# Patient Record
Sex: Female | Born: 1960 | Race: Black or African American | Hispanic: No | Marital: Single | Smoking: Former smoker
Health system: Southern US, Community
[De-identification: ages and names within clinical notes are randomized; demographics above are authoritative.]

## PROBLEM LIST (undated history)

## (undated) DIAGNOSIS — I700 Atherosclerosis of aorta: Secondary | ICD-10-CM

## (undated) DIAGNOSIS — M5412 Radiculopathy, cervical region: Secondary | ICD-10-CM

## (undated) DIAGNOSIS — I739 Peripheral vascular disease, unspecified: Secondary | ICD-10-CM

## (undated) DIAGNOSIS — G5600 Carpal tunnel syndrome, unspecified upper limb: Secondary | ICD-10-CM

## (undated) DIAGNOSIS — E785 Hyperlipidemia, unspecified: Secondary | ICD-10-CM

## (undated) DIAGNOSIS — J45909 Unspecified asthma, uncomplicated: Secondary | ICD-10-CM

## (undated) DIAGNOSIS — M5416 Radiculopathy, lumbar region: Secondary | ICD-10-CM

## (undated) DIAGNOSIS — I4891 Unspecified atrial fibrillation: Secondary | ICD-10-CM

## (undated) DIAGNOSIS — I2510 Atherosclerotic heart disease of native coronary artery without angina pectoris: Secondary | ICD-10-CM

## (undated) DIAGNOSIS — G5602 Carpal tunnel syndrome, left upper limb: Secondary | ICD-10-CM

## (undated) DIAGNOSIS — M069 Rheumatoid arthritis, unspecified: Secondary | ICD-10-CM

## (undated) DIAGNOSIS — I5032 Chronic diastolic (congestive) heart failure: Secondary | ICD-10-CM

## (undated) DIAGNOSIS — M170 Bilateral primary osteoarthritis of knee: Secondary | ICD-10-CM

## (undated) DIAGNOSIS — I340 Nonrheumatic mitral (valve) insufficiency: Secondary | ICD-10-CM

## (undated) DIAGNOSIS — I219 Acute myocardial infarction, unspecified: Secondary | ICD-10-CM

## (undated) DIAGNOSIS — I272 Pulmonary hypertension, unspecified: Secondary | ICD-10-CM

## (undated) DIAGNOSIS — M1990 Unspecified osteoarthritis, unspecified site: Secondary | ICD-10-CM

## (undated) DIAGNOSIS — I9789 Other postprocedural complications and disorders of the circulatory system, not elsewhere classified: Secondary | ICD-10-CM

## (undated) DIAGNOSIS — R7303 Prediabetes: Secondary | ICD-10-CM

## (undated) DIAGNOSIS — K219 Gastro-esophageal reflux disease without esophagitis: Secondary | ICD-10-CM

## (undated) DIAGNOSIS — E7800 Pure hypercholesterolemia, unspecified: Secondary | ICD-10-CM

## (undated) DIAGNOSIS — Z7901 Long term (current) use of anticoagulants: Secondary | ICD-10-CM

## (undated) DIAGNOSIS — J432 Centrilobular emphysema: Secondary | ICD-10-CM

## (undated) DIAGNOSIS — I440 Atrioventricular block, first degree: Secondary | ICD-10-CM

## (undated) DIAGNOSIS — I639 Cerebral infarction, unspecified: Secondary | ICD-10-CM

## (undated) DIAGNOSIS — I509 Heart failure, unspecified: Secondary | ICD-10-CM

## (undated) DIAGNOSIS — I6523 Occlusion and stenosis of bilateral carotid arteries: Secondary | ICD-10-CM

## (undated) HISTORY — PX: WISDOM TOOTH EXTRACTION: SHX21

## (undated) HISTORY — DX: Gastro-esophageal reflux disease without esophagitis: K21.9

## (undated) HISTORY — DX: Unspecified osteoarthritis, unspecified site: M19.90

## (undated) HISTORY — PX: CARDIAC VALVE REPLACEMENT: SHX585

---

## 2005-01-03 DIAGNOSIS — I219 Acute myocardial infarction, unspecified: Secondary | ICD-10-CM

## 2005-01-03 HISTORY — DX: Acute myocardial infarction, unspecified: I21.9

## 2006-04-17 ENCOUNTER — Ambulatory Visit: Payer: Self-pay | Admitting: Internal Medicine

## 2006-05-19 ENCOUNTER — Emergency Department: Payer: Self-pay | Admitting: Emergency Medicine

## 2006-05-20 ENCOUNTER — Emergency Department: Payer: Self-pay | Admitting: Emergency Medicine

## 2006-05-21 ENCOUNTER — Emergency Department: Payer: Self-pay | Admitting: Emergency Medicine

## 2006-05-24 ENCOUNTER — Emergency Department: Payer: Self-pay | Admitting: Emergency Medicine

## 2012-01-04 HISTORY — PX: BREAST BIOPSY: SHX20

## 2012-05-23 ENCOUNTER — Ambulatory Visit: Payer: Self-pay

## 2012-05-30 ENCOUNTER — Ambulatory Visit: Payer: Self-pay

## 2012-06-11 ENCOUNTER — Encounter: Payer: Self-pay | Admitting: General Surgery

## 2012-06-11 ENCOUNTER — Ambulatory Visit (INDEPENDENT_AMBULATORY_CARE_PROVIDER_SITE_OTHER): Payer: PRIVATE HEALTH INSURANCE | Admitting: General Surgery

## 2012-06-11 VITALS — BP 140/82 | HR 62 | Resp 16 | Ht 68.0 in | Wt 250.0 lb

## 2012-06-11 DIAGNOSIS — N63 Unspecified lump in unspecified breast: Secondary | ICD-10-CM

## 2012-06-11 NOTE — Progress Notes (Signed)
Patient ID: Theresa Phelps, female   DOB: 08/17/1960, 52 y.o.   MRN: [id number]  Chief Complaint  Patient presents with  . Breast Problem    abnormal mammogram category 4    HPI [name] is a 52 y.o. female who presents for consultation on an abnormal mammogram. The most recent mammogram was done on 05/30/12 cat 4 . No family history of breast cancer. Np prior breast problems. HPI  Past Medical History  Diagnosis Date  . Acid reflux   . Arthritis     Past Surgical History  Procedure Laterality Date  . Wisdom tooth extraction      History reviewed. No pertinent family history.  Social History History  Substance Use Topics  . Smoking status: Current Every Day Smoker -- 2.00 packs/day for 20 years    Types: Cigarettes  . Smokeless tobacco: Never Used  . Alcohol Use: Yes    Allergies  Allergen Reactions  . Shellfish Allergy     Current Outpatient Prescriptions  Medication Sig Dispense Refill  . ibuprofen (ADVIL,MOTRIN) 200 MG tablet Take 400 mg by mouth as needed for pain.       No current facility-administered medications for this visit.    Review of Systems Review of Systems  Constitutional: Negative.   Respiratory: Positive for chest tightness. Negative for apnea, cough, choking, shortness of breath, wheezing and stridor.   Cardiovascular: Negative.  Negative for chest pain, palpitations and leg swelling.    Blood pressure 140/82, pulse 62, resp. rate 16, height 5\' 8"  (1.727 m), weight 250 lb (113.399 kg).  Physical Exam Physical Exam  Constitutional: She appears well-developed and well-nourished.  Eyes: Conjunctivae are normal. No scleral icterus.  Neck: Neck supple.  Cardiovascular: Normal rate, regular rhythm and normal heart sounds.   Pulmonary/Chest: Breath sounds normal. Right breast exhibits no inverted nipple, no mass, no nipple discharge, no skin change and no tenderness. Left breast exhibits no inverted nipple, no mass, no nipple discharge, no  skin change and no tenderness.  Abdominal: Soft. Bowel sounds are normal. There is no hepatomegaly. There is tenderness in the left upper quadrant.  Lymphadenopathy:    She has no cervical adenopathy.    She has no axillary adenopathy.    Data Reviewed Mammogram reviewed. Hypoechoic mass noted left breast at 5 o'cl.  Assessment    Left breast mass. Core biopsy discussed and completed today with pt's consent.     Plan    Assuming path is benign will recheck in 2 mos.        SANKAR,SEEPLAPUTHUR G 06/12/2012, 8:44 AM

## 2012-06-11 NOTE — Patient Instructions (Addendum)

## 2012-06-12 ENCOUNTER — Encounter: Payer: Self-pay | Admitting: General Surgery

## 2012-06-12 DIAGNOSIS — N63 Unspecified lump in unspecified breast: Secondary | ICD-10-CM | POA: Insufficient documentation

## 2012-06-13 ENCOUNTER — Encounter: Payer: Self-pay | Admitting: General Surgery

## 2012-06-13 ENCOUNTER — Telehealth: Payer: Self-pay | Admitting: *Deleted

## 2012-06-13 LAB — PATHOLOGY

## 2012-06-13 NOTE — Telephone Encounter (Signed)
Notified patient as instructed, pathology benign, left breast biopsy, per Dr. Evette Cristal, patient pleased. Discussed follow-up appointments in 2 months, patient agrees

## 2012-08-14 ENCOUNTER — Encounter: Payer: Self-pay | Admitting: General Surgery

## 2012-08-14 ENCOUNTER — Other Ambulatory Visit: Payer: Self-pay

## 2012-08-14 ENCOUNTER — Ambulatory Visit (INDEPENDENT_AMBULATORY_CARE_PROVIDER_SITE_OTHER): Payer: PRIVATE HEALTH INSURANCE | Admitting: General Surgery

## 2012-08-14 VITALS — BP 124/80 | HR 78 | Resp 18 | Ht 68.0 in | Wt 251.0 lb

## 2012-08-14 DIAGNOSIS — N63 Unspecified lump in unspecified breast: Secondary | ICD-10-CM

## 2012-08-14 NOTE — Progress Notes (Signed)
Patient ID: Theresa Phelps, female   DOB: 1960/02/04, 52 y.o.   MRN: [id number]  Chief Complaint  Patient presents with  . Other    left breast ultrasound    HPI Theresa Phelps is a 52 y.o. female here today for an two month left breast follow up after core biopsy. The patient states in the last  2 two she has felt heaviness in the right breast as well as a stinging sensation. She denies any other problems at this time.   HPI  Past Medical History  Diagnosis Date  . Acid reflux   . Arthritis     Past Surgical History  Procedure Laterality Date  . Wisdom tooth extraction    . Breast biopsy Left 2014    History reviewed. No pertinent family history.  Social History History  Substance Use Topics  . Smoking status: Current Every Day Smoker -- 2.00 packs/day for 20 years    Types: Cigarettes  . Smokeless tobacco: Never Used  . Alcohol Use: Yes    Allergies  Allergen Reactions  . Shellfish Allergy     Current Outpatient Prescriptions  Medication Sig Dispense Refill  . ibuprofen (ADVIL,MOTRIN) 200 MG tablet Take 400 mg by mouth as needed for pain.       No current facility-administered medications for this visit.    Review of Systems Review of Systems  Constitutional: Negative.   Respiratory: Negative.   Cardiovascular: Negative.     Blood pressure 124/80, pulse 78, resp. rate 18, height 5\' 8"  (1.727 m), weight 251 lb (113.853 kg).  Physical Exam Physical Exam  Constitutional: She is oriented to person, place, and time. She appears well-developed and well-nourished.  Eyes: Conjunctivae are normal. No scleral icterus.  Neck: Trachea normal. No thyromegaly present.  Pulmonary/Chest: Right breast exhibits no inverted nipple, no mass, no nipple discharge, no skin change and no tenderness. Left breast exhibits no inverted nipple, no mass, no nipple discharge, no skin change and no tenderness.  Lymphadenopathy:    She has no cervical adenopathy.    She has no  axillary adenopathy.  Neurological: She is alert and oriented to person, place, and time.  Skin: Skin is warm and dry.    Data Reviewed Ultrasound done today.   Assessment    Stable small benign nodule left breast.     Plan    Patient to have left breast mammogram done 6 months from the original.      The patient has been asked to return to the office for a unilateral left breast diagnostic mammogram in November 2014. This has been arranged at the [facility] for 11-27-12 at 11 am.  Patient to follow up after mammogram in our office. Since mammogram is scheduled for late November she will be placed in December 2014 recalls for office follow up.   Dane Bloch G 08/14/2012, 7:15 PM

## 2012-08-14 NOTE — Patient Instructions (Addendum)
The patient has been asked to return to the office for a unilateral left breast diagnostic mammogram in November 2014. This has been arranged at the [facility] for 11-27-12 at 11 am.  Patient to follow up after mammogram in our office. Since mammogram is scheduled for late November she will be placed in December 2014 recalls for office follow up.

## 2012-08-15 ENCOUNTER — Encounter: Payer: Self-pay | Admitting: General Surgery

## 2012-11-27 ENCOUNTER — Ambulatory Visit: Payer: Self-pay | Admitting: General Surgery

## 2012-12-18 ENCOUNTER — Ambulatory Visit: Payer: PRIVATE HEALTH INSURANCE | Admitting: General Surgery

## 2013-01-07 ENCOUNTER — Ambulatory Visit (INDEPENDENT_AMBULATORY_CARE_PROVIDER_SITE_OTHER): Payer: PRIVATE HEALTH INSURANCE | Admitting: General Surgery

## 2013-01-07 ENCOUNTER — Encounter: Payer: Self-pay | Admitting: General Surgery

## 2013-01-07 VITALS — Ht 69.0 in | Wt 259.0 lb

## 2013-01-07 DIAGNOSIS — N63 Unspecified lump in unspecified breast: Secondary | ICD-10-CM

## 2013-01-07 NOTE — Patient Instructions (Signed)
Continue self breast exams. Call office for any new breast issues or concerns. 

## 2013-01-07 NOTE — Progress Notes (Signed)
Patient ID: Theresa Phelps, female   DOB: April 28, 1960, 53 y.o.   MRN: [id number]  Chief Complaint  Patient presents with  . Follow-up    mammogram    HPI Theresa Phelps is a 53 y.o. female who presents for a breast evaluation. The most recent mammogram was done on 11/27/12 at [facility].Patient does perform regular self breast checks and gets regular mammograms done. She had a core biopsy of the left breast on 06/12/12, pathology SCLEROSING ADENOSIS AND STROMAL FIBROSIS. Patient reports she is still having some stinging that comes and goes in the upper left breast. She does report some left arm numbness and swelling in the left hand for the last 3 days.   HPI  Past Medical History  Diagnosis Date  . Acid reflux   . Arthritis     Past Surgical History  Procedure Laterality Date  . Wisdom tooth extraction    . Breast biopsy Left 2014    History reviewed. No pertinent family history.  Social History History  Substance Use Topics  . Smoking status: Current Every Day Smoker -- 2.00 packs/day for 20 years    Types: Cigarettes  . Smokeless tobacco: Never Used  . Alcohol Use: Yes    Allergies  Allergen Reactions  . Shellfish Allergy     Current Outpatient Prescriptions  Medication Sig Dispense Refill  . ibuprofen (ADVIL,MOTRIN) 200 MG tablet Take 400 mg by mouth as needed for pain.       No current facility-administered medications for this visit.    Review of Systems Review of Systems  Constitutional: Negative.   Respiratory: Negative.   Cardiovascular: Negative.     Height 5\' 9"  (1.753 m), weight 259 lb (117.482 kg).  Physical Exam Physical Exam  Constitutional: She is oriented to person, place, and time. She appears well-developed and well-nourished.  Neck: Neck supple.  Pulmonary/Chest: Right breast exhibits no inverted nipple, no mass, no nipple discharge, no skin change and no tenderness. Left breast exhibits no inverted nipple, no mass, no nipple discharge, no skin  change and no tenderness.  Lymphadenopathy:    She has no cervical adenopathy.    She has no axillary adenopathy.  Neurological: She is alert and oriented to person, place, and time.    Data Reviewed Left mammogram shows the clip adjacent to a nodule which appears smaller than before.  Assessment    Stable exam with benign left breast nodule     Plan    Patient advised to obtain yearly mammogram and exams per PCP or [facility].        SANKAR,SEEPLAPUTHUR G 01/07/2013, 10:45 AM

## 2013-01-20 ENCOUNTER — Emergency Department: Payer: Self-pay | Admitting: Emergency Medicine

## 2013-01-20 LAB — CBC WITH DIFFERENTIAL/PLATELET
BASOS ABS: 0.1 10*3/uL (ref 0.0–0.1)
Basophil %: 1.1 %
Eosinophil #: 0.1 10*3/uL (ref 0.0–0.7)
Eosinophil %: 0.9 %
HCT: 45.1 % (ref 35.0–47.0)
HGB: 14.9 g/dL (ref 12.0–16.0)
LYMPHS ABS: 2.2 10*3/uL (ref 1.0–3.6)
LYMPHS PCT: 21 %
MCH: 29 pg (ref 26.0–34.0)
MCHC: 33.2 g/dL (ref 32.0–36.0)
MCV: 88 fL (ref 80–100)
Monocyte #: 0.2 x10 3/mm (ref 0.2–0.9)
Monocyte %: 1.9 %
NEUTROS ABS: 7.7 10*3/uL — AB (ref 1.4–6.5)
Neutrophil %: 75.1 %
PLATELETS: 248 10*3/uL (ref 150–440)
RBC: 5.15 10*6/uL (ref 3.80–5.20)
RDW: 13.5 % (ref 11.5–14.5)
WBC: 10.2 10*3/uL (ref 3.6–11.0)

## 2013-01-21 LAB — COMPREHENSIVE METABOLIC PANEL
ALBUMIN: 3.7 g/dL (ref 3.4–5.0)
ALK PHOS: 105 U/L
ALT: 22 U/L (ref 12–78)
AST: 14 U/L — AB (ref 15–37)
Anion Gap: 10 (ref 7–16)
BILIRUBIN TOTAL: 0.4 mg/dL (ref 0.2–1.0)
BUN: 17 mg/dL (ref 7–18)
CALCIUM: 9.2 mg/dL (ref 8.5–10.1)
CO2: 20 mmol/L — AB (ref 21–32)
Chloride: 108 mmol/L — ABNORMAL HIGH (ref 98–107)
Creatinine: 1.14 mg/dL (ref 0.60–1.30)
GFR CALC NON AF AMER: 55 — AB
Glucose: 165 mg/dL — ABNORMAL HIGH (ref 65–99)
Osmolality: 281 (ref 275–301)
Potassium: 4.1 mmol/L (ref 3.5–5.1)
Sodium: 138 mmol/L (ref 136–145)
TOTAL PROTEIN: 8.5 g/dL — AB (ref 6.4–8.2)

## 2013-01-21 LAB — URINALYSIS, COMPLETE
Bilirubin,UR: NEGATIVE
Glucose,UR: NEGATIVE mg/dL (ref 0–75)
Hyaline Cast: 8
Ketone: NEGATIVE
Nitrite: NEGATIVE
PH: 5 (ref 4.5–8.0)
Protein: NEGATIVE
RBC,UR: 10 /HPF (ref 0–5)
SPECIFIC GRAVITY: 1.027 (ref 1.003–1.030)
Squamous Epithelial: 7
WBC UR: 37 /HPF (ref 0–5)

## 2013-01-21 LAB — LIPASE, BLOOD: Lipase: 137 U/L (ref 73–393)

## 2013-01-21 LAB — TROPONIN I

## 2013-01-22 LAB — URINE CULTURE

## 2013-11-04 ENCOUNTER — Encounter: Payer: Self-pay | Admitting: General Surgery

## 2014-09-08 ENCOUNTER — Emergency Department: Payer: Medicaid Other

## 2014-09-08 ENCOUNTER — Encounter: Payer: Self-pay | Admitting: Emergency Medicine

## 2014-09-08 ENCOUNTER — Emergency Department
Admission: EM | Admit: 2014-09-08 | Discharge: 2014-09-09 | Disposition: A | Discharge status: Home / Self Care | Payer: Medicaid Other | Attending: Emergency Medicine | Admitting: Emergency Medicine

## 2014-09-08 DIAGNOSIS — Z720 Tobacco use: Secondary | ICD-10-CM | POA: Insufficient documentation

## 2014-09-08 DIAGNOSIS — G500 Trigeminal neuralgia: Secondary | ICD-10-CM | POA: Insufficient documentation

## 2014-09-08 DIAGNOSIS — R42 Dizziness and giddiness: Secondary | ICD-10-CM | POA: Diagnosis present

## 2014-09-08 DIAGNOSIS — Z79899 Other long term (current) drug therapy: Secondary | ICD-10-CM | POA: Diagnosis not present

## 2014-09-08 DIAGNOSIS — E119 Type 2 diabetes mellitus without complications: Secondary | ICD-10-CM | POA: Insufficient documentation

## 2014-09-08 LAB — URINALYSIS COMPLETE WITH MICROSCOPIC (ARMC ONLY)
BILIRUBIN URINE: NEGATIVE
Bacteria, UA: NONE SEEN
Glucose, UA: NEGATIVE mg/dL
KETONES UR: NEGATIVE mg/dL
Nitrite: NEGATIVE
PH: 5 (ref 5.0–8.0)
PROTEIN: NEGATIVE mg/dL
SPECIFIC GRAVITY, URINE: 1.016 (ref 1.005–1.030)

## 2014-09-08 LAB — CBC
HCT: 43.6 % (ref 35.0–47.0)
HEMOGLOBIN: 14.5 g/dL (ref 12.0–16.0)
MCH: 29.5 pg (ref 26.0–34.0)
MCHC: 33.3 g/dL (ref 32.0–36.0)
MCV: 88.7 fL (ref 80.0–100.0)
Platelets: 240 10*3/uL (ref 150–440)
RBC: 4.91 MIL/uL (ref 3.80–5.20)
RDW: 13.9 % (ref 11.5–14.5)
WBC: 8.4 10*3/uL (ref 3.6–11.0)

## 2014-09-08 LAB — BASIC METABOLIC PANEL
ANION GAP: 6 (ref 5–15)
BUN: 14 mg/dL (ref 6–20)
CALCIUM: 8.7 mg/dL — AB (ref 8.9–10.3)
CHLORIDE: 102 mmol/L (ref 101–111)
CO2: 25 mmol/L (ref 22–32)
Creatinine, Ser: 1.13 mg/dL — ABNORMAL HIGH (ref 0.44–1.00)
GFR calc non Af Amer: 54 mL/min — ABNORMAL LOW (ref >60)
Glucose, Bld: 99 mg/dL (ref 65–99)
Potassium: 4.1 mmol/L (ref 3.5–5.1)
SODIUM: 133 mmol/L — AB (ref 135–145)

## 2014-09-08 LAB — TROPONIN I: Troponin I: <0.03 ng/mL (ref <0.031)

## 2014-09-08 LAB — GLUCOSE, CAPILLARY: Glucose-Capillary: 89 mg/dL (ref 65–99)

## 2014-09-08 NOTE — ED Notes (Signed)
Patient ambulatory to triage with steady gait, without difficulty or distress noted; pt reports sharp pain to left side of head & ear last few days with numbness to toes and hands, dizziness; st off PO diabetic meds for several months

## 2014-09-08 NOTE — ED Provider Notes (Signed)
[facility] Emergency Department Provider Note  ____________________________________________  Time seen: 11:00 PM  I have reviewed the triage vital signs and the nursing notes.   HISTORY  Chief Complaint Dizziness; Headache; and Numbness      HPI Theresa Phelps is a 54 y.o. female presents with multiple medical complaints including left-sided facial pain with burning sensation on face bilateral hands and feet numbness and tingling dizziness. In addition patient admits to "running out of her diabetic medication and metformin times several months     Past Medical History  Diagnosis Date  . Acid reflux   . Arthritis   . Diabetes mellitus without complication     Patient Active Problem List   Diagnosis Date Noted  . Lump or mass in breast 06/12/2012    Past Surgical History  Procedure Laterality Date  . Wisdom tooth extraction    . Breast biopsy Left 2014    Current Outpatient Rx  Name  Route  Sig  Dispense  Refill  . cetirizine (ZYRTEC) 10 MG tablet   Oral   Take 10 mg by mouth daily.         Marland Kitchen ibuprofen (ADVIL,MOTRIN) 200 MG tablet   Oral   Take 400 mg by mouth as needed for pain.           Allergies Shellfish allergy  No family history on file.  Social History Social History  Substance Use Topics  . Smoking status: Current Every Day Smoker -- 0.50 packs/day for 20 years    Types: Cigarettes  . Smokeless tobacco: Never Used  . Alcohol Use: Yes     Comment: twice week    Review of Systems  Constitutional: Negative for fever. Eyes: Negative for visual changes. ENT: Negative for sore throat. Cardiovascular: Negative for chest pain. Respiratory: Negative for shortness of breath. Gastrointestinal: Negative for abdominal pain, vomiting and diarrhea. Genitourinary: Negative for dysuria. Musculoskeletal: Negative for back pain. Skin: Negative for rash. Neurological: Negative for headaches, focal weakness or  numbness.   10-point ROS otherwise negative.  ____________________________________________   PHYSICAL EXAM:  VITAL SIGNS: ED Triage Vitals  Enc Vitals Group     BP 09/08/14 1923 137/103 mmHg     Pulse Rate 09/08/14 1923 67     Resp --      Temp 09/08/14 1923 97.8 F (36.6 C)     Temp Source 09/08/14 1923 Oral     SpO2 09/08/14 1923 96 %     Weight 09/08/14 1923 242 lb (109.77 kg)     Height 09/08/14 1923 5\' 9"  (1.753 m)     Head Cir --      Peak Flow --      Pain Score 09/08/14 1926 8     Pain Loc --      Pain Edu? --      Excl. in [place]? --      Constitutional: Alert and oriented. Well appearing and in no distress. Eyes: Conjunctivae are normal. PERRL. Normal extraocular movements. ENT   Head: Normocephalic and atraumatic.   Nose: No congestion/rhinnorhea.   Mouth/Throat: Mucous membranes are moist.   Neck: No stridor. Hematological/Lymphatic/Immunilogical: No cervical lymphadenopathy. Cardiovascular: Normal rate, regular rhythm. Normal and symmetric distal pulses are present in all extremities. No murmurs, rubs, or gallops. Respiratory: Normal respiratory effort without tachypnea nor retractions. Breath sounds are clear and equal bilaterally. No wheezes/rales/rhonchi. Gastrointestinal: Soft and nontender. No distention. There is no CVA tenderness. Genitourinary: deferred Musculoskeletal: Nontender with normal range  of motion in all extremities. No joint effusions.  No lower extremity tenderness nor edema. Neurologic:  Normal speech and language. No gross focal neurologic deficits are appreciated. Speech is normal.  Skin:  Skin is warm, dry and intact. No rash noted. Psychiatric: Mood and affect are normal. Speech and behavior are normal. Patient exhibits appropriate insight and judgment.  ____________________________________________    LABS (pertinent positives/negatives)  Labs Reviewed  BASIC METABOLIC PANEL - Abnormal; Notable for the following:     Sodium 133 (*)    Creatinine, Ser 1.13 (*)    Calcium 8.7 (*)    GFR calc non Af Amer 54 (*)    All other components within normal limits  URINALYSIS COMPLETEWITH MICROSCOPIC ([facility] ONLY) - Abnormal; Notable for the following:    Color, Urine STRAW (*)    APPearance CLEAR (*)    Hgb urine dipstick 2+ (*)    Leukocytes, UA 1+ (*)    Squamous Epithelial / LPF 0-5 (*)    All other components within normal limits  GLUCOSE, CAPILLARY  CBC  TROPONIN I  CBG MONITORING, ED     ____________________________________________   EKG  ED ECG REPORT I, Rossi Silvestro, Sabula N, the attending physician, personally viewed and interpreted this ECG.   Date: 09/09/2014  EKG Time: 7:31 PM  Rate: 64  Rhythm: Normal sinus rhythm  Axis: None  Intervals: Normal  ST&T Change: None   ____________________________________________    RADIOLOGY     CT Head Wo Contrast (Final result) Result time: 09/09/14 00:39:59   Final result by Rad Results In Interface (09/09/14 00:39:59)   Narrative:   CLINICAL DATA: Initial valuation for acute left-sided headache. Blurry vision.  EXAM: CT HEAD WITHOUT CONTRAST  TECHNIQUE: Contiguous axial images were obtained from the base of the skull through the vertex without intravenous contrast.  COMPARISON: None.  FINDINGS: There is no acute intracranial hemorrhage or infarct. No mass lesion or midline shift. Gray-white matter differentiation is well maintained. Ventricles are normal in size without evidence of hydrocephalus. CSF containing spaces are within normal limits. No extra-axial fluid collection. Scattered vascular calcifications present within the carotid siphons. Mild chronic small vessel ischemic changes present.  The calvarium is intact.  Orbital soft tissues are within normal limits.  The paranasal sinuses and mastoid air cells are well pneumatized and free of fluid.  Scalp soft tissues are unremarkable.  IMPRESSION: 1. No acute  intracranial process. 2. Mild chronic small vessel ischemic disease.   Electronically Signed By: Jeannine Boga M.D. On: 09/09/2014 00:39       INITIAL IMPRESSION / ASSESSMENT AND PLAN / ED COURSE  Pertinent labs & imaging results that were available during my care of the patient were reviewed by me and considered in my medical decision making (see chart for details).  History of physical exam consistent with possible trigeminal neuralgia as such patient was prescribed carbamazepine  ____________________________________________   FINAL CLINICAL IMPRESSION(S) / ED DIAGNOSES  Final diagnoses:  Trigeminal neuralgia of left side of face      Gregor Hams, MD 09/09/14 [phone]

## 2014-09-09 MED ORDER — CARBAMAZEPINE 100 MG PO CHEW
100.0000 mg | CHEWABLE_TABLET | Freq: Once | ORAL | Status: AC
  Administered 2014-09-09: 100 mg via ORAL
  Filled 2014-09-09: qty 1

## 2014-09-09 MED ORDER — CARBAMAZEPINE 100 MG PO CHEW: 100.0000 mg | CHEWABLE_TABLET | Freq: Two times a day (BID) | ORAL | Status: DC

## 2014-09-09 NOTE — ED Notes (Signed)
Called pharmacy again to send pt medication.

## 2014-09-09 NOTE — ED Notes (Signed)
Called pharmacy to send pt medication. 

## 2014-09-09 NOTE — Discharge Instructions (Signed)
Trigeminal Neuralgia  Trigeminal neuralgia is a nerve disorder that causes sudden attacks of severe facial pain. It is caused by damage to the trigeminal nerve, a major nerve in the face. It is more common in women and in the elderly, although it can also happen in younger patients. Attacks last from a few seconds to several minutes and can occur from a couple of times per year to several times per day. Trigeminal neuralgia can be a very distressing and disabling condition. Surgery may be needed in very severe cases if medical treatment does not give relief.  HOME CARE INSTRUCTIONS    If your caregiver prescribed medication to help prevent attacks, take as directed.   To help prevent attacks:   Chew on the unaffected side of the mouth.   Avoid touching your face.   Avoid blasts of hot or cold air.   Men may wish to grow a beard to avoid having to shave.  SEEK IMMEDIATE MEDICAL CARE IF:   Pain is unbearable and your medicine does not help.   You develop new, unexplained symptoms (problems).   You have problems that may be related to a medication you are taking.  Document Released: 12/18/1999 Document Revised: 03/14/2011 Document Reviewed: 10/17/2008  Exit[employer] Patient Information 2015 [employer]. This information is not intended to replace advice given to you by your health care provider. Make sure you discuss any questions you have with your health care provider.

## 2014-09-26 ENCOUNTER — Ambulatory Visit
Admission: RE | Admit: 2014-09-26 | Discharge: 2014-09-26 | Disposition: A | Discharge status: Home / Self Care | Payer: Disability Insurance | Source: Ambulatory Visit | Attending: Internal Medicine | Admitting: Internal Medicine

## 2014-09-26 ENCOUNTER — Other Ambulatory Visit: Payer: Self-pay | Admitting: Family Medicine

## 2014-09-26 ENCOUNTER — Other Ambulatory Visit: Payer: Self-pay | Admitting: Internal Medicine

## 2014-09-26 DIAGNOSIS — M25552 Pain in left hip: Secondary | ICD-10-CM | POA: Diagnosis not present

## 2014-09-26 DIAGNOSIS — M1990 Unspecified osteoarthritis, unspecified site: Secondary | ICD-10-CM

## 2014-09-26 LAB — COMPREHENSIVE METABOLIC PANEL
ALT: 18 U/L (ref 14–54)
ANION GAP: 8 (ref 5–15)
AST: 21 U/L (ref 15–41)
Albumin: 3.6 g/dL (ref 3.5–5.0)
Alkaline Phosphatase: 96 U/L (ref 38–126)
BILIRUBIN TOTAL: 0.2 mg/dL — AB (ref 0.3–1.2)
BUN: 10 mg/dL (ref 6–20)
CO2: 23 mmol/L (ref 22–32)
Calcium: 8.8 mg/dL — ABNORMAL LOW (ref 8.9–10.3)
Chloride: 105 mmol/L (ref 101–111)
Creatinine, Ser: 0.85 mg/dL (ref 0.44–1.00)
Glucose, Bld: 109 mg/dL — ABNORMAL HIGH (ref 65–99)
POTASSIUM: 3.7 mmol/L (ref 3.5–5.1)
Sodium: 136 mmol/L (ref 135–145)
TOTAL PROTEIN: 7.5 g/dL (ref 6.5–8.1)

## 2015-03-25 ENCOUNTER — Encounter: Payer: Self-pay | Admitting: Podiatry

## 2015-03-25 ENCOUNTER — Ambulatory Visit (INDEPENDENT_AMBULATORY_CARE_PROVIDER_SITE_OTHER): Payer: Medicaid Other

## 2015-03-25 ENCOUNTER — Ambulatory Visit (INDEPENDENT_AMBULATORY_CARE_PROVIDER_SITE_OTHER): Payer: Medicaid Other | Admitting: Podiatry

## 2015-03-25 VITALS — BP 118/65 | HR 78 | Resp 16

## 2015-03-25 DIAGNOSIS — G9519 Other vascular myelopathies: Secondary | ICD-10-CM

## 2015-03-25 DIAGNOSIS — G629 Polyneuropathy, unspecified: Secondary | ICD-10-CM

## 2015-03-25 DIAGNOSIS — M2040 Other hammer toe(s) (acquired), unspecified foot: Secondary | ICD-10-CM | POA: Diagnosis not present

## 2015-03-25 DIAGNOSIS — I739 Peripheral vascular disease, unspecified: Secondary | ICD-10-CM | POA: Diagnosis not present

## 2015-03-25 DIAGNOSIS — M79673 Pain in unspecified foot: Secondary | ICD-10-CM

## 2015-03-25 DIAGNOSIS — M722 Plantar fascial fibromatosis: Secondary | ICD-10-CM | POA: Diagnosis not present

## 2015-03-25 NOTE — Progress Notes (Signed)
   Subjective:    Patient ID: Theresa Phelps, female    DOB: [birth year], 55 y.o.   MRN: [id number]  HPI: She presents today with her daughter with a chief complaint of pain and numbness to her toes while walking for the past 2-3 years. She states that not only does she have back problems because sciatica from her left thigh and buttocks but she also experiences bilateral calf pain on ambulation. She denies a history of diabetes does relate to a cigarette addiction for 40 years 3 packs a day. She relates that with ambulation she has calf pain that results in her having to stop and sit. She states that after she takes a break she can get up and travels same distance again.    Review of Systems  Constitutional: Positive for diaphoresis, activity change and appetite change.  HENT: Positive for ear pain and sinus pressure.   Respiratory: Positive for apnea and shortness of breath.   Cardiovascular: Positive for leg swelling.  Musculoskeletal: Positive for myalgias, back pain and arthralgias.  Allergic/Immunologic: Positive for food allergies.  Neurological: Positive for weakness.  All other systems reviewed and are negative.      Objective:   Physical Exam: Vital signs are stable alert and oriented 3 pulses are nonpalpable bilateral capillary fill time is immediate. Mild edema no cellulitis drainage or odor. Neurologic sensorium is hyper sensate with allodynic type pain to the toes bilaterally. She has no pain on palpation of the cast bilaterally. Deep tendon reflexes are intact bilaterally muscle strength +5 over 5 dorsiflexion plantar flexors and inverters everters all intrinsic musculature is intact. Orthopedic evaluation demonstrates rigid hammertoe deformities 2 through 5 bilaterally. She also has a firm nodule to the medial arch right foot that is nonpulsatile and is more than likely a plantar fibroma. This is exquisitely tender on palpation. She states that she does have some night pain with  her feet in the bed. Radiographs taken today do not demonstrate any major osseous abnormalities. Doesn't demonstrate any calcification of the soft tissue lesion.        Assessment & Plan:  Assessment: Intermittent claudication bilateral. 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  Plan: We discussed etiology pathology conservative versus surgical therapies. Before we can attempt any type of surgery or medication I would like to have a vascular evaluation and consult.

## 2015-03-26 ENCOUNTER — Telehealth: Payer: Self-pay | Admitting: Podiatry

## 2015-03-26 ENCOUNTER — Telehealth: Payer: Self-pay | Admitting: *Deleted

## 2015-03-26 DIAGNOSIS — R0989 Other specified symptoms and signs involving the circulatory and respiratory systems: Secondary | ICD-10-CM

## 2015-03-26 NOTE — Telephone Encounter (Signed)
-----   Message from [place] sent at 03/25/2015  3:56 PM EDT ----- Regarding: Vascular studies She needs arterial dopplers and vascular consult. She has NO palpable pulses. [facility] please! Thanks!!

## 2015-03-26 NOTE — Telephone Encounter (Signed)
Pt called in stating that Dr. Milinda Pointer was suppose to give her a Rx for pain but she never received the Rx

## 2015-03-26 NOTE — Telephone Encounter (Addendum)
Pt states she was to get a pain medication after yesterday's visit.  03/30/2015-Dr. Milinda Pointer states, "Note cleary states last line nothing will be done until after vascular evaluation including medication.  I want to wait until after vascular evaluation before we start any medication."  Unable to leave message voicemail not set up.

## 2015-03-26 NOTE — Telephone Encounter (Addendum)
Referral and dopplers faxed.  04/22/2015-Pt's dtr, April called the [facility] and stated pt had not received an appt call from [facility].  I spoke with Neoma Laming - [facility], she states they had left message for pt to call for an appt.  I called April and gave her the [phone] appt line.

## 2015-03-27 ENCOUNTER — Other Ambulatory Visit: Payer: Self-pay | Admitting: Podiatry

## 2015-03-27 NOTE — Telephone Encounter (Signed)
Note clearly states last line nothing will be done until after vascular evaluation including medication. I want to wait until after vascular evaluation before we start any medicine.

## 2015-04-22 ENCOUNTER — Telehealth: Payer: Self-pay | Admitting: Podiatry

## 2015-04-22 NOTE — Telephone Encounter (Signed)
Patients daughter here today said the patient saw Dr. Milinda Pointer back on 03/30/15 and he referred her to [facility] , she never heard from anyone about this appointment. Please call daughter April Caserta with details on this appointment.

## 2015-04-29 ENCOUNTER — Telehealth: Payer: Self-pay | Admitting: *Deleted

## 2015-04-29 DIAGNOSIS — R0989 Other specified symptoms and signs involving the circulatory and respiratory systems: Secondary | ICD-10-CM

## 2015-04-29 NOTE — Telephone Encounter (Addendum)
Theresa Phelps AUTHORIZATION# [id number], VALID 04/22/2015 EXPIRES 05/22/2015.  Faxed to [facility].

## 2015-05-18 ENCOUNTER — Other Ambulatory Visit: Payer: Self-pay | Admitting: Vascular Surgery

## 2015-05-28 ENCOUNTER — Encounter
Admission: RE | Disposition: A | Discharge status: Home / Self Care | Payer: Self-pay | Source: Ambulatory Visit | Attending: Vascular Surgery

## 2015-05-28 ENCOUNTER — Ambulatory Visit
Admission: RE | Admit: 2015-05-28 | Discharge: 2015-05-28 | Disposition: A | Discharge status: Home / Self Care | Payer: Medicaid Other | Source: Ambulatory Visit | Attending: Vascular Surgery | Admitting: Vascular Surgery

## 2015-05-28 DIAGNOSIS — M79662 Pain in left lower leg: Secondary | ICD-10-CM | POA: Diagnosis not present

## 2015-05-28 DIAGNOSIS — F17210 Nicotine dependence, cigarettes, uncomplicated: Secondary | ICD-10-CM | POA: Insufficient documentation

## 2015-05-28 DIAGNOSIS — M1990 Unspecified osteoarthritis, unspecified site: Secondary | ICD-10-CM | POA: Insufficient documentation

## 2015-05-28 DIAGNOSIS — M79661 Pain in right lower leg: Secondary | ICD-10-CM | POA: Diagnosis not present

## 2015-05-28 DIAGNOSIS — K219 Gastro-esophageal reflux disease without esophagitis: Secondary | ICD-10-CM | POA: Diagnosis not present

## 2015-05-28 DIAGNOSIS — Z91018 Allergy to other foods: Secondary | ICD-10-CM | POA: Insufficient documentation

## 2015-05-28 DIAGNOSIS — Z91013 Allergy to seafood: Secondary | ICD-10-CM | POA: Insufficient documentation

## 2015-05-28 DIAGNOSIS — I70213 Atherosclerosis of native arteries of extremities with intermittent claudication, bilateral legs: Secondary | ICD-10-CM | POA: Diagnosis present

## 2015-05-28 HISTORY — PX: LOWER EXTREMITY ANGIOGRAPHY: CATH118251

## 2015-05-28 LAB — CREATININE, SERUM
Creatinine, Ser: 1.03 mg/dL — ABNORMAL HIGH (ref 0.44–1.00)
GFR calc Af Amer: >60 mL/min (ref >60)
GFR, EST NON AFRICAN AMERICAN: 60 mL/min — AB (ref >60)

## 2015-05-28 LAB — BUN: BUN: 11 mg/dL (ref 6–20)

## 2015-05-28 SURGERY — LOWER EXTREMITY ANGIOGRAPHY
Anesthesia: Moderate Sedation | Laterality: Left

## 2015-05-28 MED ORDER — LORATADINE 10 MG PO TABS: 10.0000 mg | ORAL_TABLET | Freq: Every day | ORAL | Status: DC

## 2015-05-28 MED ORDER — PHENOL 1.4 % MT LIQD: 1.0000 | OROMUCOSAL | Status: DC | PRN

## 2015-05-28 MED ORDER — DEXTROSE 5 % IV SOLN
1.5000 g | INTRAVENOUS | Status: AC
  Administered 2015-05-28: 1.5 g via INTRAVENOUS

## 2015-05-28 MED ORDER — FENTANYL CITRATE (PF) 100 MCG/2ML IJ SOLN
INTRAMUSCULAR | Status: AC
  Filled 2015-05-28: qty 2

## 2015-05-28 MED ORDER — HEPARIN SODIUM (PORCINE) 1000 UNIT/ML IJ SOLN
INTRAMUSCULAR | Status: AC
  Filled 2015-05-28: qty 1

## 2015-05-28 MED ORDER — METHYLPREDNISOLONE SODIUM SUCC 125 MG IJ SOLR
125.0000 mg | INTRAMUSCULAR | Status: DC | PRN
  Administered 2015-05-28: 125 mg via INTRAVENOUS

## 2015-05-28 MED ORDER — CLOPIDOGREL BISULFATE 75 MG PO TABS: 75.0000 mg | ORAL_TABLET | Freq: Every day | ORAL | Status: DC

## 2015-05-28 MED ORDER — FAMOTIDINE 20 MG PO TABS
40.0000 mg | ORAL_TABLET | ORAL | Status: DC | PRN
  Administered 2015-05-28: 40 mg via ORAL

## 2015-05-28 MED ORDER — OXYCODONE-ACETAMINOPHEN 5-325 MG PO TABS: 1.0000 | ORAL_TABLET | ORAL | Status: DC | PRN

## 2015-05-28 MED ORDER — HYDROMORPHONE HCL 1 MG/ML IJ SOLN: 0.5000 mg | INTRAMUSCULAR | Status: DC | PRN

## 2015-05-28 MED ORDER — SIMVASTATIN 20 MG PO TABS: 20.0000 mg | ORAL_TABLET | Freq: Every day | ORAL | Status: DC

## 2015-05-28 MED ORDER — IOPAMIDOL (ISOVUE-300) INJECTION 61%
INTRAVENOUS | Status: DC | PRN
Start: 2015-05-28 — End: 2015-05-28
  Administered 2015-05-28: 70 mg via INTRA_ARTERIAL

## 2015-05-28 MED ORDER — METHYLPREDNISOLONE SODIUM SUCC 125 MG IJ SOLR
INTRAMUSCULAR | Status: AC
  Filled 2015-05-28: qty 2

## 2015-05-28 MED ORDER — MIDAZOLAM HCL 5 MG/5ML IJ SOLN
INTRAMUSCULAR | Status: AC
  Filled 2015-05-28: qty 5

## 2015-05-28 MED ORDER — ONDANSETRON HCL 4 MG/2ML IJ SOLN: 4.0000 mg | Freq: Four times a day (QID) | INTRAMUSCULAR | Status: DC | PRN

## 2015-05-28 MED ORDER — FAMOTIDINE 20 MG PO TABS
ORAL_TABLET | ORAL | Status: AC
  Filled 2015-05-28: qty 2

## 2015-05-28 MED ORDER — GUAIFENESIN-DM 100-10 MG/5ML PO SYRP: 15.0000 mL | ORAL_SOLUTION | ORAL | Status: DC | PRN

## 2015-05-28 MED ORDER — HEPARIN (PORCINE) IN NACL 2-0.9 UNIT/ML-% IJ SOLN
INTRAMUSCULAR | Status: AC
  Filled 2015-05-28: qty 1000

## 2015-05-28 MED ORDER — LIDOCAINE-EPINEPHRINE (PF) 1 %-1:200000 IJ SOLN
INTRAMUSCULAR | Status: AC
  Filled 2015-05-28: qty 30

## 2015-05-28 MED ORDER — SODIUM CHLORIDE 0.9 % IV SOLN: 500.0000 mL | Freq: Once | INTRAVENOUS | Status: DC | PRN

## 2015-05-28 MED ORDER — HYDROMORPHONE HCL 1 MG/ML IJ SOLN: 1.0000 mg | Freq: Once | INTRAMUSCULAR | Status: DC

## 2015-05-28 MED ORDER — SODIUM CHLORIDE 0.9 % IV SOLN
INTRAVENOUS | Status: DC
  Administered 2015-05-28: 10:00:00 via INTRAVENOUS

## 2015-05-28 MED ORDER — HYDRALAZINE HCL 20 MG/ML IJ SOLN: 5.0000 mg | INTRAMUSCULAR | Status: DC | PRN

## 2015-05-28 MED ORDER — LIDOCAINE-EPINEPHRINE (PF) 1 %-1:200000 IJ SOLN
INTRAMUSCULAR | Status: DC | PRN
  Administered 2015-05-28: 10 mL via INTRADERMAL

## 2015-05-28 MED ORDER — LABETALOL HCL 5 MG/ML IV SOLN: 10.0000 mg | INTRAVENOUS | Status: DC | PRN

## 2015-05-28 MED ORDER — FENTANYL CITRATE (PF) 100 MCG/2ML IJ SOLN
INTRAMUSCULAR | Status: DC | PRN
  Administered 2015-05-28 (×2): 50 ug via INTRAVENOUS

## 2015-05-28 MED ORDER — ACETAMINOPHEN 325 MG RE SUPP: 325.0000 mg | RECTAL | Status: DC | PRN

## 2015-05-28 MED ORDER — HYDROMORPHONE HCL 1 MG/ML IJ SOLN
INTRAMUSCULAR | Status: DC
Start: 2015-05-28 — End: 2015-05-28
  Filled 2015-05-28: qty 1

## 2015-05-28 MED ORDER — HEPARIN SODIUM (PORCINE) 1000 UNIT/ML IJ SOLN
INTRAMUSCULAR | Status: DC | PRN
  Administered 2015-05-28: 5000 [IU] via INTRAVENOUS

## 2015-05-28 MED ORDER — MIDAZOLAM HCL 2 MG/2ML IJ SOLN
INTRAMUSCULAR | Status: DC | PRN
  Administered 2015-05-28 (×2): 1 mg via INTRAVENOUS
  Administered 2015-05-28: 2 mg via INTRAVENOUS

## 2015-05-28 MED ORDER — METOPROLOL TARTRATE 5 MG/5ML IV SOLN: 2.0000 mg | INTRAVENOUS | Status: DC | PRN

## 2015-05-28 MED ORDER — ACETAMINOPHEN 325 MG PO TABS: 325.0000 mg | ORAL_TABLET | ORAL | Status: DC | PRN

## 2015-05-28 SURGICAL SUPPLY — 22 items
BALLN LUTONIX 6X150X130 (BALLOONS) ×6
BALLN ULTRVRSE 130X300X6 (BALLOONS) ×1
BALLN ULTRVRSE 6X300X130 (BALLOONS) ×2
BALLOON LUTONIX 6X150X130 (BALLOONS) ×2 IMPLANT
BALLOON ULTRVRSE 130X300X6 (BALLOONS) ×1 IMPLANT
CATH CXI 4F 90 DAV (MICROCATHETER) ×3 IMPLANT
CATH PIG 70CM (CATHETERS) ×3 IMPLANT
CATH VERT 100CM (CATHETERS) ×3 IMPLANT
DEVICE PRESTO INFLATION (MISCELLANEOUS) ×3 IMPLANT
DEVICE STARCLOSE SE CLOSURE (Vascular Products) ×3 IMPLANT
GLIDEWIRE ADV .035X260CM (WIRE) ×3 IMPLANT
LIFESTENT SOLO 6X200X135 (Permanent Stent) ×3 IMPLANT
LIFESTENT SOLO 7X200X135 (Permanent Stent) ×3 IMPLANT
PACK ANGIOGRAPHY (CUSTOM PROCEDURE TRAY) ×3 IMPLANT
SHEATH ANL2 6FRX45 HC (SHEATH) ×3 IMPLANT
SHEATH BRITE TIP 5FRX11 (SHEATH) ×3 IMPLANT
STENT VIABAHN 6X150X120 (Permanent Stent) ×3 IMPLANT
SYR MEDRAD MARK V 150ML (SYRINGE) ×3 IMPLANT
TOWEL OR 17X26 4PK STRL BLUE (TOWEL DISPOSABLE) ×3 IMPLANT
TUBING CONTRAST HIGH PRESS 72 (TUBING) ×3 IMPLANT
WIRE G V18X300CM (WIRE) ×3 IMPLANT
WIRE J 3MM .035X145CM (WIRE) ×3 IMPLANT

## 2015-05-28 NOTE — H&P (Signed)
  [facility] History & Physical Update  The patient was interviewed and re-examined.  The patient's previous History and Physical has been reviewed and is unchanged.  There is no change in the plan of care. We plan to proceed with the scheduled procedure.  Devonne Kitchen, MD  05/28/2015, 8:02 AM   

## 2015-05-28 NOTE — Op Note (Signed)
[facility] Percutaneous Study/Intervention Procedural Note   Date of Surgery: 05/28/2015  Surgeon(s):DEW,JASON   Assistants:none  Pre-operative Diagnosis: PAD with claudication bilateral lower extremities  Post-operative diagnosis: Same  Procedure(s) Performed: 1. Ultrasound guidance for vascular access right femoral artery 2. Catheter placement into left popliteal artery from right femoral approach 3. Aortogram and selective left lower extremity angiogram 4. Percutaneous transluminal angioplasty of left SFA and popliteal artery with 6 mm diameter angioplasty balloon including 2 Lutonix drug-coated angioplasty balloons proximally and distally 5. Self-expanding stent placement to the SFA and above-knee popliteal artery with 7 mm diameter by 20 cm length stent proximally and 6 mm diameter by 20 cm length stent distally  6.  Viabahn covered stent placement for the distal SFA and above-knee popliteal artery for extravasation after angioplasty and self-expanding stent placement using a 6 mm diameter by 15 cm length Viabahn stent 7. StarClose closure device right femoral artery  EBL: 25 cc  Contrast: 60 cc  Fluoro Time: 9 minutes  Moderate Conscious Sedation Time: approximately 50 minutes using 4 mg of Versed and 100 mcg of Fentanyl  Indications: Patient is a 55 y.o.female with short distance lifestyle limiting claudication of both lower extremities. The patient has noninvasive study showing markedly reduced ABIs bilaterally. The patient is brought in for angiography for further evaluation and potential treatment. Risks and benefits are discussed and informed consent is obtained  Procedure: The patient was identified and appropriate procedural time out was performed. The patient was then placed supine on the table and prepped and draped in the usual sterile  fashion.Moderate conscious sedation was administered during a face to face encounter with the patient throughout the procedure with my supervision of the RN administering medicines and monitoring the patient's vital signs, pulse oximetry, telemetry and mental status throughout from the start of the procedure until the patient was taken to the recovery room. Ultrasound was used to evaluate the right common femoral artery. It was patent . A digital ultrasound image was acquired. A Seldinger needle was used to access the right common femoral artery under direct ultrasound guidance and a permanent image was performed. A 0.035 J wire was advanced without resistance and a 5Fr sheath was placed. Pigtail catheter was placed into the aorta and an AP aortogram was performed. This demonstrated normal renal arteries and normal aorta and iliac segments without significant stenosis. I then crossed the aortic bifurcation and advanced to the left femoral head. Selective left lower extremity angiogram was then performed. This demonstrated near flush occlusion of the superficial femoral artery just beyond its origin with a patent profunda femoris artery and reconstitution of the popliteal artery above the knee. There was then good runoff distally with at least 2 vessels and possibly a third although opacification was reasonably slow initially. The patient was systemically heparinized and a 6 [place] Ansell sheath was then placed over the [employer] wire. I then used a Kumpe catheter and the advantage wire to navigate through the SFA occlusion and confirm intraluminal flow in the popliteal artery with a CXI catheter. I then replaced the wire and proceeded with treatment. 6 mm diameter angioplasty balloons were used to treat the SFA and popliteal artery. Proximally and distally, a 6 mm diameter by 15 cm length Lutonix drug-coated angioplasty balloon was used and in the midportion a second inflation with each balloon was used  as well. Each inflation was from 10-14 atm. Following angioplasty, there were still multiple areas with greater than 50% residual  stenosis and a marked dissection distally. I elected to place self-expanding stents and used a 7 mm diameter by 20 cm length proximally and a 6 mm diameter by 20 cm length distally with minimal overlap. About 1-2 cm of SFA were left proximally with only minimal stenosis above the stent before its origin. 6 mm balloon was used to post dilatation. 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 I elected to cover this area with a covered stent. I exchanged for a 0.018 wire and used a 6 mm diameter by 15 cm length Viabahn stent in the distal SFA and above-knee popliteal artery. Completion angiogram showed no extravasation and no greater than 20% residual stenosis throughout the stents and brisk flow distally. I elected to terminate the procedure. The sheath was removed and StarClose closure device was deployed in the right femoral artery with excellent hemostatic result. The patient was taken to the recovery room in stable condition having tolerated the procedure well.  Findings:  Aortogram: two left renal arteries (one low lying) and one renal artery, all patent with no stenosis.  Normal aorta and iliac segments without stenosis.   Left Lower Extremity: near flush occlusion of the superficial femoral artery just beyond its origin with a patent profunda femoris artery and reconstitution of the popliteal artery above the knee. There was then good runoff distally with at least 2 vessels and possibly a third although opacification was reasonably slow initially.   Disposition: Patient was taken to the recovery room in stable condition having tolerated the procedure well.  Complications: None  DEW,JASON 05/28/2015 2:10 PM

## 2015-05-28 NOTE — Discharge Instructions (Signed)
Angiogram, Care After °Refer to this sheet in the next few weeks. These instructions provide you with information about caring for yourself after your procedure. Your health care provider may also give you more specific instructions. Your treatment has been planned according to current medical practices, but problems sometimes occur. Call your health care provider if you have any problems or questions after your procedure. °WHAT TO EXPECT AFTER THE PROCEDURE °After your procedure, it is typical to have the following: °· Bruising at the catheter insertion site that usually fades within 1-2 weeks. °· Blood collecting in the tissue (hematoma) that may be painful to the touch. It should usually decrease in size and tenderness within 1-2 weeks. °HOME CARE INSTRUCTIONS °· Take medicines only as directed by your health care provider. °· You may shower 24-48 hours after the procedure or as directed by your health care provider. Remove the bandage (dressing) and gently wash the site with plain soap and water. Pat the area dry with a clean towel. Do not rub the site, because this may cause bleeding. °· Do not take baths, swim, or use a hot tub until your health care provider approves. °· Check your insertion site every day for redness, swelling, or drainage. °· Do not apply powder or lotion to the site. °· Do not lift over 10 lb (4.5 kg) for 5 days after your procedure or as directed by your health care provider. °· Ask your health care provider when it is okay to: °¨ Return to work or school. °¨ Resume usual physical activities or sports. °¨ Resume sexual activity. °· Do not drive home if you are discharged the same day as the procedure. Have someone else drive you. °· You may drive 24 hours after the procedure unless otherwise instructed by your health care provider. °· Do not operate machinery or power tools for 24 hours after the procedure or as directed by your health care provider. °· If your procedure was done as an  outpatient procedure, which means that you went home the same day as your procedure, a responsible adult should be with you for the first 24 hours after you arrive home. °· Keep all follow-up visits as directed by your health care provider. This is important. °SEEK MEDICAL CARE IF: °· You have a fever. °· You have chills. °· You have increased bleeding from the catheter insertion site. Hold pressure on the site. °SEEK IMMEDIATE MEDICAL CARE IF: °· You have unusual pain at the catheter insertion site. °· You have redness, warmth, or swelling at the catheter insertion site. °· You have drainage (other than a small amount of blood on the dressing) from the catheter insertion site. °· The catheter insertion site is bleeding, and the bleeding does not stop after 30 minutes of holding steady pressure on the site. °· The area near or just beyond the catheter insertion site becomes pale, cool, tingly, or numb. °  °This information is not intended to replace advice given to you by your health care provider. Make sure you discuss any questions you have with your health care provider. °  °Document Released: 07/08/2004 Document Revised: 01/10/2014 Document Reviewed: 05/23/2012 °Elsevier Interactive Patient Education ©2016 Elsevier [employer]. ° °

## 2015-05-29 ENCOUNTER — Encounter: Payer: Self-pay | Admitting: Vascular Surgery

## 2015-06-02 NOTE — H&P (Signed)
[facility] Admission History & Physical  MRN : [id number]  Theresa Phelps is a 55 y.o. (May 15, 1960) female who presents with chief complaint of No chief complaint on file. Marland Kitchen  History of Present Illness: Patient is a 55 year old female with pain in her extremities bilaterally with walking. She's had noninvasive studies which demonstrated significant peripheral arterial disease bilaterally with significant reduction in her ABIs bilaterally. Her symptoms are consistent with short distance claudication. She does not describe any ulceration or infection. She desires intervention for her lifestyle limiting claudication.  No current facility-administered medications for this encounter.   Current Outpatient Prescriptions  Medication Sig Dispense Refill  . cetirizine (ZYRTEC) 10 MG tablet Take 10 mg by mouth daily. Reported on 05/28/2015    . clopidogrel (PLAVIX) 75 MG tablet Take 1 tablet (75 mg total) by mouth daily. 30 tablet 11  . ibuprofen (ADVIL,MOTRIN) 200 MG tablet Take 400 mg by mouth as needed for pain. Reported on 05/28/2015    . simvastatin (ZOCOR) 20 MG tablet Take 1 tablet (20 mg total) by mouth daily at 6 PM. 30 tablet 5    Past Medical History  Diagnosis Date  . Acid reflux   . Arthritis     Past Surgical History  Procedure Laterality Date  . Wisdom tooth extraction    . Breast biopsy Left 2014  . Peripheral vascular catheterization Left 05/28/2015    Procedure: Lower Extremity Angiography;  Surgeon: Algernon Huxley, MD;  Location: [facility];  Service: Cardiovascular;  Laterality: Left;    Social History Social History  Substance Use Topics  . Smoking status: Current Every Day Smoker -- 0.50 packs/day for 20 years    Types: Cigarettes  . Smokeless tobacco: Never Used  . Alcohol Use: Yes     Comment: twice week    Family History No history of bleeding disorders, clotting disorders, or aneurysms  Allergies  Allergen Reactions  .  Shellfish Allergy Anaphylaxis  . Banana     Other reaction(s): Other (See Comments) Burning in the mouth  . Garlic     Other reaction(s): Other (See Comments) Pt states arms freeze up and can't talk but no SOB  . Fish-Derived Products Rash     REVIEW OF SYSTEMS (Negative unless checked)  Constitutional: [] Weight loss  [] Fever  [] Chills Cardiac: [] Chest pain   [] Chest pressure   [] Palpitations   [] Shortness of breath when laying flat   [] Shortness of breath at rest   [] Shortness of breath with exertion. Vascular:  [x] Pain in legs with walking   [] Pain in legs at rest   [] Pain in legs when laying flat   [] Claudication   [] Pain in feet when walking  [] Pain in feet at rest  [] Pain in feet when laying flat   [] History of DVT   [] Phlebitis   [] Swelling in legs   [] Varicose veins   [] Non-healing ulcers Pulmonary:   [] Uses home oxygen   [] Productive cough   [] Hemoptysis   [] Wheeze  [] COPD   [] Asthma Neurologic:  [] Dizziness  [] Blackouts   [] Seizures   [] History of stroke   [] History of TIA  [] Aphasia   [] Temporary blindness   [] Dysphagia   [] Weakness or numbness in arms   [] Weakness or numbness in legs Musculoskeletal:  [] Arthritis   [] Joint swelling   [] Joint pain   [] Low back pain Hematologic:  [] Easy bruising  [] Easy bleeding   [] Hypercoagulable state   [] Anemic  [] Hepatitis Gastrointestinal:  [] Blood in stool   []   Vomiting blood  [x] Gastroesophageal reflux/heartburn   [] Difficulty swallowing. Genitourinary:  [] Chronic kidney disease   [] Difficult urination  [] Frequent urination  [] Burning with urination   [] Blood in urine Skin:  [] Rashes   [] Ulcers   [] Wounds Psychological:  [] History of anxiety   []  History of major depression.  Physical Examination  Filed Vitals:   05/28/15 1345 05/28/15 1400 05/28/15 1430 05/28/15 1500  BP: 135/86 132/83 127/84 130/78  Pulse: 69 65 72   Temp:      Resp: 18 16 18 17   Weight:      SpO2: 90% 90% 92%    Body mass index is 31.29 kg/(m^2). Gen: WD/WN,  NAD Head: [place]/AT, No temporalis wasting. Prominent temp pulse not noted. Ear/Nose/Throat: Hearing grossly intact, nares w/o erythema or drainage, oropharynx w/o Erythema/Exudate,  Eyes: PERRLA, EOMI.  Neck: Supple, no nuchal rigidity.  No bruit or JVD.  Pulmonary:  Good air movement, clear to auscultation bilaterally, no use of accessory muscles.  Cardiac: RRR, normal S1, S2, no Murmurs, rubs or gallops. Vascular:  Vessel Right Left  Radial Palpable Palpable  Ulnar Palpable Palpable  Brachial Palpable Palpable  Carotid Palpable, without bruit Palpable, without bruit  Aorta Not palpable N/A  Femoral Palpable Palpable  Popliteal Not Palpable Not Palpable  PT  Palpable Not Palpable  DP Not Palpable Weakly Palpable   Gastrointestinal: soft, non-tender/non-distended. No guarding/reflex.  Musculoskeletal: M/S 5/5 throughout.  Extremities without ischemic changes.  No deformity or atrophy.  Neurologic: CN 2-12 intact. Pain and light touch intact in extremities.  Symmetrical.  Speech is fluent. Motor exam as listed above. Psychiatric: Judgment intact, Mood & affect appropriate for pt's clinical situation. Dermatologic: No rashes or ulcers noted.  No cellulitis or open wounds. Lymph : No Cervical, Axillary, or Inguinal lymphadenopathy.      CBC Lab Results  Component Value Date   WBC 8.4 09/08/2014   HGB 14.5 09/08/2014   HCT 43.6 09/08/2014   MCV 88.7 09/08/2014   PLT 240 09/08/2014    BMET    Component Value Date/Time   NA 136 09/26/2014 1745   NA 138 01/20/2013 2320   K 3.7 09/26/2014 1745   K 4.1 01/20/2013 2320   CL 105 09/26/2014 1745   CL 108* 01/20/2013 2320   CO2 23 09/26/2014 1745   CO2 20* 01/20/2013 2320   GLUCOSE 109* 09/26/2014 1745   GLUCOSE 165* 01/20/2013 2320   BUN 11 05/28/2015 1027   BUN 17 01/20/2013 2320   CREATININE 1.03* 05/28/2015 1027   CREATININE 1.14 01/20/2013 2320   CALCIUM 8.8* 09/26/2014 1745   CALCIUM 9.2 01/20/2013 2320   GFRNONAA 60*  05/28/2015 1027   GFRNONAA 55* 01/20/2013 2320   GFRAA >60 05/28/2015 1027   GFRAA >60 01/20/2013 2320   CrCl cannot be calculated (Unknown ideal weight.).  COAG No results found for: INR, PROTIME  Radiology No results found.    Assessment/Plan 1. PAD with claudication BLE.  For LLE angiogram today.  May treat the right leg in future depending on results from left leg intervention.  Risks and benefits discussed.   DEW,JASON, MD  06/02/2015 4:18 PM

## 2015-06-24 ENCOUNTER — Other Ambulatory Visit: Payer: Self-pay | Admitting: Vascular Surgery

## 2015-07-15 ENCOUNTER — Other Ambulatory Visit
Admission: RE | Admit: 2015-07-15 | Discharge: 2015-07-15 | Disposition: A | Discharge status: Home / Self Care | Payer: Medicaid Other | Source: Ambulatory Visit | Attending: Vascular Surgery | Admitting: Vascular Surgery

## 2015-07-15 DIAGNOSIS — I739 Peripheral vascular disease, unspecified: Secondary | ICD-10-CM | POA: Diagnosis not present

## 2015-07-15 LAB — BUN: BUN: 6 mg/dL (ref 6–20)

## 2015-07-15 LAB — CREATININE, SERUM
Creatinine, Ser: 0.88 mg/dL (ref 0.44–1.00)
GFR calc Af Amer: >60 mL/min (ref >60)

## 2015-07-16 ENCOUNTER — Ambulatory Visit
Admission: RE | Admit: 2015-07-16 | Discharge: 2015-07-16 | Disposition: A | Discharge status: Home / Self Care | Payer: Medicaid Other | Source: Ambulatory Visit | Attending: Vascular Surgery | Admitting: Vascular Surgery

## 2015-07-16 ENCOUNTER — Encounter
Admission: RE | Disposition: A | Discharge status: Home / Self Care | Payer: Self-pay | Source: Ambulatory Visit | Attending: Vascular Surgery

## 2015-07-16 DIAGNOSIS — Z8249 Family history of ischemic heart disease and other diseases of the circulatory system: Secondary | ICD-10-CM | POA: Insufficient documentation

## 2015-07-16 DIAGNOSIS — M7989 Other specified soft tissue disorders: Secondary | ICD-10-CM | POA: Diagnosis not present

## 2015-07-16 DIAGNOSIS — Z823 Family history of stroke: Secondary | ICD-10-CM | POA: Insufficient documentation

## 2015-07-16 DIAGNOSIS — M79609 Pain in unspecified limb: Secondary | ICD-10-CM | POA: Diagnosis not present

## 2015-07-16 DIAGNOSIS — I70213 Atherosclerosis of native arteries of extremities with intermittent claudication, bilateral legs: Secondary | ICD-10-CM | POA: Insufficient documentation

## 2015-07-16 DIAGNOSIS — I739 Peripheral vascular disease, unspecified: Secondary | ICD-10-CM | POA: Insufficient documentation

## 2015-07-16 DIAGNOSIS — R200 Anesthesia of skin: Secondary | ICD-10-CM | POA: Insufficient documentation

## 2015-07-16 DIAGNOSIS — Z8342 Family history of familial hypercholesterolemia: Secondary | ICD-10-CM | POA: Insufficient documentation

## 2015-07-16 DIAGNOSIS — F17200 Nicotine dependence, unspecified, uncomplicated: Secondary | ICD-10-CM | POA: Insufficient documentation

## 2015-07-16 HISTORY — DX: Peripheral vascular disease, unspecified: I73.9

## 2015-07-16 HISTORY — PX: PERIPHERAL VASCULAR CATHETERIZATION: SHX172C

## 2015-07-16 SURGERY — LOWER EXTREMITY ANGIOGRAPHY
Anesthesia: Moderate Sedation | Site: Leg Lower | Laterality: Right

## 2015-07-16 MED ORDER — MIDAZOLAM HCL 2 MG/2ML IJ SOLN
INTRAMUSCULAR | Status: DC | PRN
  Administered 2015-07-16 (×2): 1 mg via INTRAVENOUS
  Administered 2015-07-16: 2 mg via INTRAVENOUS

## 2015-07-16 MED ORDER — METHYLPREDNISOLONE SODIUM SUCC 125 MG IJ SOLR
125.0000 mg | INTRAMUSCULAR | Status: DC | PRN
  Administered 2015-07-16: 125 mg via INTRAVENOUS

## 2015-07-16 MED ORDER — FENTANYL CITRATE (PF) 100 MCG/2ML IJ SOLN
INTRAMUSCULAR | Status: AC
  Filled 2015-07-16: qty 2

## 2015-07-16 MED ORDER — SODIUM CHLORIDE 0.9 % IV SOLN
INTRAVENOUS | Status: DC
  Administered 2015-07-16 (×2): via INTRAVENOUS

## 2015-07-16 MED ORDER — HEPARIN (PORCINE) IN NACL 2-0.9 UNIT/ML-% IJ SOLN
INTRAMUSCULAR | Status: AC
  Filled 2015-07-16: qty 1000

## 2015-07-16 MED ORDER — IOPAMIDOL (ISOVUE-300) INJECTION 61%
INTRAVENOUS | Status: DC | PRN
  Administered 2015-07-16: 75 mL via INTRAVENOUS

## 2015-07-16 MED ORDER — FAMOTIDINE 20 MG PO TABS
ORAL_TABLET | ORAL | Status: AC
  Filled 2015-07-16: qty 1

## 2015-07-16 MED ORDER — DEXTROSE 5 % IV SOLN: 1.5000 g | INTRAVENOUS | Status: DC

## 2015-07-16 MED ORDER — CLOPIDOGREL BISULFATE 75 MG PO TABS
ORAL_TABLET | ORAL | Status: AC
  Administered 2015-07-16: 150 mg
  Filled 2015-07-16: qty 2

## 2015-07-16 MED ORDER — FENTANYL CITRATE (PF) 100 MCG/2ML IJ SOLN
INTRAMUSCULAR | Status: DC | PRN
  Administered 2015-07-16 (×3): 50 ug via INTRAVENOUS

## 2015-07-16 MED ORDER — HYDROMORPHONE HCL 1 MG/ML IJ SOLN: 1.0000 mg | Freq: Once | INTRAMUSCULAR | Status: DC

## 2015-07-16 MED ORDER — FAMOTIDINE 20 MG PO TABS
40.0000 mg | ORAL_TABLET | ORAL | Status: DC | PRN
  Administered 2015-07-16: 40 mg via ORAL

## 2015-07-16 MED ORDER — CLOPIDOGREL BISULFATE 75 MG PO TABS: 150.0000 mg | ORAL_TABLET | Freq: Once | ORAL | Status: DC

## 2015-07-16 MED ORDER — LIDOCAINE-EPINEPHRINE (PF) 1 %-1:200000 IJ SOLN
INTRAMUSCULAR | Status: AC
  Filled 2015-07-16: qty 30

## 2015-07-16 MED ORDER — ONDANSETRON HCL 4 MG/2ML IJ SOLN: 4.0000 mg | Freq: Four times a day (QID) | INTRAMUSCULAR | Status: DC | PRN

## 2015-07-16 MED ORDER — MIDAZOLAM HCL 2 MG/2ML IJ SOLN
INTRAMUSCULAR | Status: AC
  Filled 2015-07-16: qty 2

## 2015-07-16 MED ORDER — METHYLPREDNISOLONE SODIUM SUCC 125 MG IJ SOLR
INTRAMUSCULAR | Status: AC
  Filled 2015-07-16: qty 2

## 2015-07-16 SURGICAL SUPPLY — 17 items
BALLN LUTONIX 5X150X130 (BALLOONS) ×8
BALLN ULTRVRSE 6X100X130C (BALLOONS) ×4
BALLOON LUTONIX 5X150X130 (BALLOONS) ×4 IMPLANT
BALLOON ULTRVRSE 6X100X130C (BALLOONS) ×2 IMPLANT
CATH PIG 70CM (CATHETERS) ×4 IMPLANT
CATH VERT 100CM (CATHETERS) ×4 IMPLANT
DEVICE PRESTO INFLATION (MISCELLANEOUS) ×4 IMPLANT
DEVICE STARCLOSE SE CLOSURE (Vascular Products) ×4 IMPLANT
GLIDEWIRE ADV .035X260CM (WIRE) ×4 IMPLANT
LIFESTENT 6X80X130 (Permanent Stent) ×4 IMPLANT
PACK ANGIOGRAPHY (CUSTOM PROCEDURE TRAY) ×4 IMPLANT
SHEATH ANL2 6FRX45 HC (SHEATH) ×4 IMPLANT
SHEATH BRITE TIP 5FRX11 (SHEATH) ×4 IMPLANT
STENT LIFESTENT 6X170X130 (Permanent Stent) ×4 IMPLANT
SYR MEDRAD MARK V 150ML (SYRINGE) ×4 IMPLANT
TUBING CONTRAST HIGH PRESS 72 (TUBING) ×4 IMPLANT
WIRE J 3MM .035X145CM (WIRE) ×4 IMPLANT

## 2015-07-16 NOTE — Op Note (Addendum)
[facility] Percutaneous Study/Intervention Procedural Note   Date of Surgery: 07/16/2015  Surgeon(s):DEW,JASON   Assistants:none  Pre-operative Diagnosis: PAD with claudication BLE  Post-operative diagnosis: Same  Procedure(s) Performed: 1. Ultrasound guidance for vascular access left femoral artery 2. Catheter placement into right popliteal artery from left femoral approach 3. Aortogram and selective right lower extremity angiogram 4. Percutaneous transluminal angioplasty of right popliteal artery with 46m x 15 cm length Lutonix drug coated angioplasty balloon 5. percutaneous transluminal angioplasty of the right SFA with 5 mm x 15 cm length Lutonix drug coated balloon (inflated two additional times for mid and distal segments), and 6 mm diameter conventional angioplasty balloon proximally  6.  Stent placement to the proximal SFA with 6 mm x 10 cm stent and to the distal SFA and popliteal artery with 6 mm x 17 cm stent 7. StarClose closure device left femoral artery  EBL: 25 cc  Contrast: 75 cc  Fluoro Time: 7.1 minutes  Moderate Conscious Sedation Time: approximately 40 minutes using 5 mg of Versed and 200 mcg of Fentanyl  Indications: Patient is a 55y.o.female with short distance claudication.  THe left leg has been treated and is better. The patient has noninvasive study showing right ABI of 0.6. The patient is brought in for angiography for further evaluation and potential treatment. Risks and benefits are discussed and informed consent is obtained  Procedure: The patient was identified and appropriate procedural time out was performed. The patient was then placed supine on the table and prepped and draped in the usual sterile fashion.Moderate conscious sedation was administered during a face to face encounter with the patient throughout the procedure with  my supervision of the RN administering medicines and monitoring the patient's vital signs, pulse oximetry, telemetry and mental status throughout from the start of the procedure until the patient was taken to the recovery room. Ultrasound was used to evaluate the left common femoral artery. It was patent . A digital ultrasound image was acquired. A Seldinger needle was used to access the left common femoral artery under direct ultrasound guidance and a permanent image was performed. A 0.035 J wire was advanced without resistance and a 5Fr sheath was placed. Pigtail catheter was placed into the aorta and an AP aortogram was performed. This demonstrated normal renal arteries and normal aorta and iliac segments without significant stenosis. I then crossed the aortic bifurcation and advanced to the right femoral head. Selective right lower extremity angiogram was then performed. This demonstrated near flush right SFA occlusion with reconstitution of the popliteal artery just above the knee, two vessel runoff distally. The patient was systemically heparinized and a 6 [place] sheath was then placed over the [employer]. I then used a Kumpe catheter and the advantage wire to navigate through the occlusion and confirm intraluminal flow in the popliteal artery.  I then started treatment.  Two Lutonix balloons were used proximal and distal with a second inflation of each balloon to cover the rest of the SFA.  Each inflation was 8-10 atm for one minute.  Proximally, I used a 6 mm x 10 cm balloon as well.  The mid and distal SFA looked pretty good but there was >50% residual stenosis in the proximal SFA and the reentry point in the popliteal artery.  6 mm self expanding stents were used to treat these two residual >50% stenosis lesions and postdilated with a 5 mm balloon in the popliteal artery and 6 mm balloon in  the SFA.  <20% residual stenosis was identifed after these treatments. I elected to terminate  the procedure. The sheath was removed and StarClose closure device was deployed in the left femoral artery with excellent hemostatic result. The patient was taken to the recovery room in stable condition having tolerated the procedure well.  Findings:  Aortogram: normal renal arteries, aorta, and iliac segments Right Lower Extremity: near flush right SFA occlusion with reconstitution of the popliteal artery just above the knee, two vessel runoff distally.   Disposition: Patient was taken to the recovery room in stable condition having tolerated the procedure well.  Complications: None  DEW,JASON 07/16/2015 2:04 PM

## 2015-07-16 NOTE — Progress Notes (Signed)
Pt clinically stable post angio of leg, left groin without bleeding nor hematoma, Dr Lucky Cowboy out to speak with patient and family, encouraging  Pt to not mix doses of plavix at home, since she states her last dose was 10 days ago, reported to her significance of meds after this test and stents placed. sr per monitor, alert/awake and oriented.

## 2015-07-16 NOTE — H&P (Signed)
  [facility] History & Physical Update  The patient was interviewed and re-examined.  The patient's previous History and Physical has been reviewed and is unchanged.  There is no change in the plan of care. We plan to proceed with the scheduled procedure.  DEW,JASON, MD  07/16/2015, 9:52 AM

## 2015-07-16 NOTE — Discharge Instructions (Signed)

## 2015-07-17 ENCOUNTER — Encounter: Payer: Self-pay | Admitting: Vascular Surgery

## 2015-12-30 ENCOUNTER — Ambulatory Visit
Admission: EM | Admit: 2015-12-30 | Discharge: 2015-12-30 | Disposition: A | Discharge status: Home / Self Care | Payer: Medicaid Other | Attending: Internal Medicine | Admitting: Internal Medicine

## 2015-12-30 ENCOUNTER — Encounter: Payer: Self-pay | Admitting: Emergency Medicine

## 2015-12-30 DIAGNOSIS — J209 Acute bronchitis, unspecified: Secondary | ICD-10-CM | POA: Diagnosis not present

## 2015-12-30 DIAGNOSIS — M7051 Other bursitis of knee, right knee: Secondary | ICD-10-CM | POA: Diagnosis not present

## 2015-12-30 MED ORDER — PREDNISONE 50 MG PO TABS: 50.0000 mg | ORAL_TABLET | Freq: Every day | ORAL | 0 refills | Status: AC

## 2015-12-30 MED ORDER — ALBUTEROL SULFATE HFA 108 (90 BASE) MCG/ACT IN AERS: 1.0000 | INHALATION_SPRAY | Freq: Four times a day (QID) | RESPIRATORY_TRACT | 0 refills | Status: DC | PRN

## 2015-12-30 MED ORDER — NAPROXEN 375 MG PO TABS: 375.0000 mg | ORAL_TABLET | Freq: Two times a day (BID) | ORAL | 0 refills | Status: DC

## 2015-12-30 MED ORDER — BENZONATATE 200 MG PO CAPS: 200.0000 mg | ORAL_CAPSULE | Freq: Three times a day (TID) | ORAL | 1 refills | Status: DC | PRN

## 2015-12-30 NOTE — ED Provider Notes (Signed)
[facility]    CSN: [id number] Arrival date & time: 12/30/15  1132     History   Chief Complaint Chief Complaint  Patient presents with  . Leg Pain    HPI Theresa Phelps is a 55 y.o. female. She is a smoker and has recent history of bilateral lower extremity stenting in the last year. She presents today with 4 day history of pain and swelling in the right knee, increasing discomfort, worried about a blood clot. She has been climbing a lot of steps where she is currently living, although no other new activities. No trauma. Also reports lots of coughing for the last couple of days, and today is sore under her right lower ribs with deep breath. Tactile temperature. Some congestion.   HPI  Past Medical History:  Diagnosis Date  . Acid reflux   . Arthritis   . Peripheral vascular disease [facility])     Patient Active Problem List   Diagnosis Date Noted  . Lump or mass in breast 06/12/2012    Past Surgical History:  Procedure Laterality Date  . BREAST BIOPSY Left 2014  . PERIPHERAL VASCULAR CATHETERIZATION Left 05/28/2015   Procedure: Lower Extremity Angiography;  Surgeon: Algernon Huxley, MD;  Location: [place] [facility];  Service: Cardiovascular;  Laterality: Left;  . PERIPHERAL VASCULAR CATHETERIZATION Right 07/16/2015   Procedure: Lower Extremity Angiography;  Surgeon: Algernon Huxley, MD;  Location: [place] [facility];  Service: Cardiovascular;  Laterality: Right;  . PERIPHERAL VASCULAR CATHETERIZATION  07/16/2015   Procedure: Lower Extremity Intervention;  Surgeon: Algernon Huxley, MD;  Location: [place] [facility];  Service: Cardiovascular;;  . WISDOM TOOTH EXTRACTION      OB History    Gravida Para Term Preterm AB Living   2 2       2    SAB TAB Ectopic Multiple Live Births                  Obstetric Comments   1st Menstrual Cycle: 11 1st Pregnancy: 18       Home Medications    Prior to Admission medications   Medication Sig Start Date End Date  Taking? Authorizing Provider  albuterol (PROVENTIL HFA;VENTOLIN HFA) 108 (90 Base) MCG/ACT inhaler Inhale 1-2 puffs into the lungs every 6 (six) hours as needed for wheezing or shortness of breath. 12/30/15   Sherlene Shams, MD  benzonatate (TESSALON) 200 MG capsule Take 1 capsule (200 mg total) by mouth 3 (three) times daily as needed for cough. 12/30/15   Sherlene Shams, MD  cetirizine (ZYRTEC) 10 MG tablet Take 10 mg by mouth daily. Reported on 05/28/2015    Historical Provider, MD  clopidogrel (PLAVIX) 75 MG tablet Take 1 tablet (75 mg total) by mouth daily. 05/28/15   Algernon Huxley, MD  ibuprofen (ADVIL,MOTRIN) 200 MG tablet Take 400 mg by mouth as needed for mild pain. Reported on 05/28/2015    Historical Provider, MD  naproxen (NAPROSYN) 375 MG tablet Take 1 tablet (375 mg total) by mouth 2 (two) times daily. As needed for knee pain 12/30/15   Sherlene Shams, MD  predniSONE (DELTASONE) 50 MG tablet Take 1 tablet (50 mg total) by mouth daily. 12/30/15 01/04/16  Sherlene Shams, MD  simvastatin (ZOCOR) 20 MG tablet Take 1 tablet (20 mg total) by mouth daily at 6 PM. 05/28/15   Algernon Huxley, MD    Family History History reviewed. No pertinent family history.  Social History Social History  Substance Use Topics  . Smoking status: Current Every Day Smoker    Packs/day: 0.50    Years: 20.00    Types: Cigarettes  . Smokeless tobacco: Never Used  . Alcohol use Yes     Comment: twice week     Allergies   Shellfish allergy; Banana; Garlic; and Fish-derived products   Review of Systems Review of Systems  All other systems reviewed and are negative.    Physical Exam Triage Vital Signs ED Triage Vitals  Enc Vitals Group     BP 12/30/15 1216 132/66     Pulse Rate 12/30/15 1216 69     Resp 12/30/15 1216 18     Temp 12/30/15 1216 97.8 F (36.6 C)     Temp Source 12/30/15 1216 Oral     SpO2 12/30/15 1216 99 %     Weight 12/30/15 1220 192 lb (87.1 kg)     Height 12/30/15 1220 5\' 7"  (1.702  m)     Pain Score 12/30/15 1215 9   Updated Vital Signs BP 132/66   Pulse 69   Temp 97.8 F (36.6 C) (Oral)   Resp 18   Ht 5\' 7"  (1.702 m)   Wt 192 lb (87.1 kg)   SpO2 99%   BMI 30.07 kg/m  Physical Exam  Constitutional: She is oriented to person, place, and time. No distress.  Alert, nicely groomed  HENT:  Head: Atraumatic.  Bilateral TMs are dull, red tinged Marked nasal congestion Throat is slightly red  Eyes:  Conjugate gaze, no eye redness/drainage  Neck: Neck supple.  Cardiovascular: Normal rate and regular rhythm.   Pulmonary/Chest: No respiratory distress. She has no rales.  Coarse but symmetric breath sounds throughout Frequent coughing with deep breath  Abdominal: She exhibits no distension.  Musculoskeletal: Normal range of motion.  No leg swelling Visual inspection of right and left knees is that they're roughly symmetric except for focal swelling of the infrapatellar bursa on the right. This is warm to palpation, with faint erythema. The entire right knee actually is somewhat painful, although range of motion appears to be consistent with that on the left knee.  No distal leg swelling appreciated. Left calf is 16.25 inches, right calf is 15 and 70 inches. Left ankle is 8.25 inches and right ankle is 8 inches.  Neurological: She is alert and oriented to person, place, and time.  Skin: Skin is warm and dry.  No cyanosis  Nursing note and vitals reviewed.    UC Treatments / Results   Procedures Procedures (including critical care time) None today  Final Clinical Impressions(s) / UC Diagnoses   Final diagnoses:  Infrapatellar bursitis of right knee  Acute bronchitis with bronchospasm   Knee bursitis should improve with prednisone and naproxen.  A knee brace may provide comfort and support.  Ice (5-10 minutes several times daily) may also be helpful.   Anticipate gradual improvement in coughing/discomfort under ribs over the next several days.  Recheck or  followup with primary care provider for new fever >100.5, increasing phlegm production/nasal discharge, or if not starting to improve in a few days.    New Prescriptions New Prescriptions   ALBUTEROL (PROVENTIL HFA;VENTOLIN HFA) 108 (90 BASE) MCG/ACT INHALER    Inhale 1-2 puffs into the lungs every 6 (six) hours as needed for wheezing or shortness of breath.   BENZONATATE (TESSALON) 200 MG CAPSULE    Take 1 capsule (200 mg total) by mouth 3 (three)  times daily as needed for cough.   NAPROXEN (NAPROSYN) 375 MG TABLET    Take 1 tablet (375 mg total) by mouth 2 (two) times daily. As needed for knee pain   PREDNISONE (DELTASONE) 50 MG TABLET    Take 1 tablet (50 mg total) by mouth daily.     Sherlene Shams, MD 01/02/16 [phone]

## 2015-12-30 NOTE — Discharge Instructions (Addendum)
Knee bursitis should improve with prednisone and naproxen.  A knee brace may provide comfort and support.  Ice (5-10 minutes several times daily) may also be helpful.   Anticipate gradual improvement in coughing/discomfort under ribs over the next several days.  Recheck or followup with primary care provider for new fever >100.5, increasing phlegm production/nasal discharge, or if not starting to improve in a few days.

## 2015-12-30 NOTE — ED Triage Notes (Signed)
Patient states she has had a cough for several weeks and just developed rib pain.  She also states she has swelling and pain in her right leg.  Is concerned for blood clots

## 2016-02-04 ENCOUNTER — Other Ambulatory Visit (INDEPENDENT_AMBULATORY_CARE_PROVIDER_SITE_OTHER): Payer: Self-pay | Admitting: Vascular Surgery

## 2016-02-23 ENCOUNTER — Ambulatory Visit (INDEPENDENT_AMBULATORY_CARE_PROVIDER_SITE_OTHER): Payer: Disability Insurance | Admitting: Vascular Surgery

## 2016-02-23 ENCOUNTER — Encounter (INDEPENDENT_AMBULATORY_CARE_PROVIDER_SITE_OTHER): Payer: Disability Insurance

## 2016-03-06 ENCOUNTER — Ambulatory Visit
Admission: EM | Admit: 2016-03-06 | Discharge: 2016-03-06 | Disposition: A | Discharge status: Home / Self Care | Payer: Medicaid Other | Attending: Internal Medicine | Admitting: Internal Medicine

## 2016-03-06 DIAGNOSIS — G5601 Carpal tunnel syndrome, right upper limb: Secondary | ICD-10-CM | POA: Diagnosis not present

## 2016-03-06 MED ORDER — FUROSEMIDE 20 MG PO TABS: 20.0000 mg | ORAL_TABLET | Freq: Every day | ORAL | 0 refills | Status: DC

## 2016-03-06 MED ORDER — MELOXICAM 7.5 MG PO TABS: 7.5000 mg | ORAL_TABLET | Freq: Every day | ORAL | 1 refills | Status: DC | PRN

## 2016-03-06 NOTE — ED Provider Notes (Signed)
[facility]    CSN: [id number] Arrival date & time: 03/06/16  1449     History   Chief Complaint Chief Complaint  Patient presents with  . Arm Pain    Right    HPI Theresa Phelps is a 56 y.o. female.   C/o right arm and hand pain x3 days.  Having a hard time sleeping because of the pain.  Notes that the hand is warm.  She has a hard time gripping and opening her hand due to pain. Denies chest pain, SOB, N/V or diaphoresis. Also notes that right leg/knee is swelling.  She has had revascularization of the extremity.  Appointment for vascular surgery follow up soon.      Past Medical History:  Diagnosis Date  . Acid reflux   . Arthritis   . Peripheral vascular disease [facility])     Patient Active Problem List   Diagnosis Date Noted  . Lump or mass in breast 06/12/2012    Past Surgical History:  Procedure Laterality Date  . BREAST BIOPSY Left 2014  . PERIPHERAL VASCULAR CATHETERIZATION Left 05/28/2015   Procedure: Lower Extremity Angiography;  Surgeon: Algernon Huxley, MD;  Location: [place] [facility];  Service: Cardiovascular;  Laterality: Left;  . PERIPHERAL VASCULAR CATHETERIZATION Right 07/16/2015   Procedure: Lower Extremity Angiography;  Surgeon: Algernon Huxley, MD;  Location: [place] [facility];  Service: Cardiovascular;  Laterality: Right;  . PERIPHERAL VASCULAR CATHETERIZATION  07/16/2015   Procedure: Lower Extremity Intervention;  Surgeon: Algernon Huxley, MD;  Location: [place] [facility];  Service: Cardiovascular;;  . WISDOM TOOTH EXTRACTION      OB History    Gravida Para Term Preterm AB Living   2 2       2    SAB TAB Ectopic Multiple Live Births                  Obstetric Comments   1st Menstrual Cycle: 11 1st Pregnancy: 18       Home Medications    Prior to Admission medications   Medication Sig Start Date End Date Taking? Authorizing Provider  albuterol (PROVENTIL HFA;VENTOLIN HFA) 108 (90 Base) MCG/ACT inhaler Inhale 1-2 puffs into the  lungs every 6 (six) hours as needed for wheezing or shortness of breath. 12/30/15   Sherlene Shams, MD  benzonatate (TESSALON) 200 MG capsule Take 1 capsule (200 mg total) by mouth 3 (three) times daily as needed for cough. 12/30/15   Sherlene Shams, MD  cetirizine (ZYRTEC) 10 MG tablet Take 10 mg by mouth daily. Reported on 05/28/2015    Historical Provider, MD  clopidogrel (PLAVIX) 75 MG tablet Take 1 tablet (75 mg total) by mouth daily. 05/28/15   Algernon Huxley, MD  furosemide (LASIX) 20 MG tablet Take 1 tablet (20 mg total) by mouth daily. 03/06/16 03/09/16  Harrie Foreman, MD  ibuprofen (ADVIL,MOTRIN) 200 MG tablet Take 400 mg by mouth as needed for mild pain. Reported on 05/28/2015    Historical Provider, MD  meloxicam (MOBIC) 7.5 MG tablet Take 1 tablet (7.5 mg total) by mouth daily as needed for pain. Take with food 03/06/16   Harrie Foreman, MD  naproxen (NAPROSYN) 375 MG tablet Take 1 tablet (375 mg total) by mouth 2 (two) times daily. As needed for knee pain 12/30/15   Sherlene Shams, MD  simvastatin (ZOCOR) 20 MG tablet Take 1 tablet (20 mg total) by mouth daily at 6 PM.  05/28/15   Algernon Huxley, MD    Family History History reviewed. No pertinent family history.  Social History Social History  Substance Use Topics  . Smoking status: Current Every Day Smoker    Packs/day: 0.50    Years: 20.00    Types: Cigarettes  . Smokeless tobacco: Never Used  . Alcohol use Yes     Comment: twice week     Allergies   Shellfish allergy; Banana; Garlic; and Fish-derived products   Review of Systems Review of Systems  Constitutional: Negative for chills and fever.  HENT: Negative for sore throat and tinnitus.   Eyes: Negative for redness.  Respiratory: Negative for cough and shortness of breath.   Cardiovascular: Negative for chest pain and palpitations.  Gastrointestinal: Negative for abdominal pain, diarrhea, nausea and vomiting.  Genitourinary: Negative for dysuria, frequency and  urgency.  Musculoskeletal: Negative for myalgias.       See HPI  Skin: Negative for rash.       No lesions  Neurological: Negative for weakness.  Hematological: Does not bruise/bleed easily.  Psychiatric/Behavioral: Negative for suicidal ideas.     Physical Exam Triage Vital Signs ED Triage Vitals  Enc Vitals Group     BP 03/06/16 1507 (!) 145/95     Pulse Rate 03/06/16 1507 71     Resp 03/06/16 1507 18     Temp 03/06/16 1507 98 F (36.7 C)     Temp Source 03/06/16 1507 Oral     SpO2 03/06/16 1507 99 %     Weight 03/06/16 1506 200 lb (90.7 kg)     Height --      Head Circumference --      Peak Flow --      Pain Score 03/06/16 1507 8     Pain Loc --      Pain Edu? --      Excl. in [place]? --    No data found.   Updated Vital Signs BP (!) 145/95 (BP Location: Left Arm)   Pulse 71   Temp 98 F (36.7 C) (Oral)   Resp 18   Wt 200 lb (90.7 kg)   SpO2 99%   BMI 31.32 kg/m   Visual Acuity Right Eye Distance:   Left Eye Distance:   Bilateral Distance:    Right Eye Near:   Left Eye Near:    Bilateral Near:     Physical Exam  Constitutional: She is oriented to person, place, and time. She appears well-developed and well-nourished. No distress.  HENT:  Head: Normocephalic and atraumatic.  Mouth/Throat: Oropharynx is clear and moist.  Eyes: Conjunctivae and EOM are normal. Pupils are equal, round, and reactive to light. No scleral icterus.  Neck: Normal range of motion. Neck supple. No JVD present. No tracheal deviation present. No thyromegaly present.  Cardiovascular: Normal rate, regular rhythm and normal heart sounds.  Exam reveals no gallop and no friction rub.   No murmur heard. Pulmonary/Chest: Effort normal and breath sounds normal.  Abdominal: Soft. Bowel sounds are normal. She exhibits no distension. There is no tenderness.  Musculoskeletal: Normal range of motion. She exhibits no edema.  Right hand is mildly warmer and slightly swollen compared to left    Lymphadenopathy:    She has no cervical adenopathy.  Neurological: She is alert and oriented to person, place, and time. No cranial nerve deficit.  +Tinnel's sign right wrist  Skin: Skin is warm and dry.  Psychiatric: She has a normal mood and  affect. Her behavior is normal. Judgment and thought content normal.  Nursing note and vitals reviewed.    UC Treatments / Results  Labs (all labs ordered are listed, but only abnormal results are displayed) Labs Reviewed - No data to display  EKG  EKG Interpretation None       Radiology No results found.  Procedures Procedures (including critical care time)  Medications Ordered in UC Medications - No data to display   Initial Impression / Assessment and Plan / UC Course  I have reviewed the triage vital signs and the nursing notes.  Pertinent labs & imaging results that were available during my care of the patient were reviewed by me and considered in my medical decision making (see chart for details).    No evidence of stroke or CAD equivalents.  Pain c/w carpal tunnel syndrome.  Wrist orthotic prescribed. Brief course of diuretic and meloxicam. Previous kidney function normal.  She is not taking other NSAID's (did not fill naprosyn). Needs ortho referral from PMD.  Final Clinical Impressions(s) / UC Diagnoses   Final diagnoses:  Carpal tunnel syndrome of right wrist    New Prescriptions New Prescriptions   FUROSEMIDE (LASIX) 20 MG TABLET    Take 1 tablet (20 mg total) by mouth daily.   MELOXICAM (MOBIC) 7.5 MG TABLET    Take 1 tablet (7.5 mg total) by mouth daily as needed for pain. Take with food     Harrie Foreman, MD 03/06/16 [phone]

## 2016-03-06 NOTE — ED Triage Notes (Signed)
Pt c/o right arm and hand pain, she says it burns and she cant open up her hand. Denies any trauma to her arm she says it just started on Friday.

## 2016-03-10 ENCOUNTER — Telehealth: Payer: Self-pay

## 2016-03-10 NOTE — Telephone Encounter (Signed)
Courtesy call back completed today for patient's recent visit at [facility]. Patient did not answer, left message on machine to call back with any questions or concerns.   

## 2016-03-11 ENCOUNTER — Encounter: Payer: Self-pay | Admitting: Vascular Surgery

## 2016-04-14 ENCOUNTER — Other Ambulatory Visit (INDEPENDENT_AMBULATORY_CARE_PROVIDER_SITE_OTHER): Payer: Self-pay | Admitting: Vascular Surgery

## 2016-04-14 DIAGNOSIS — Z95820 Peripheral vascular angioplasty status with implants and grafts: Secondary | ICD-10-CM

## 2016-04-14 DIAGNOSIS — I739 Peripheral vascular disease, unspecified: Secondary | ICD-10-CM

## 2016-04-15 ENCOUNTER — Ambulatory Visit (INDEPENDENT_AMBULATORY_CARE_PROVIDER_SITE_OTHER): Payer: Medicaid Other | Admitting: Vascular Surgery

## 2016-04-15 ENCOUNTER — Encounter (INDEPENDENT_AMBULATORY_CARE_PROVIDER_SITE_OTHER): Payer: Self-pay | Admitting: Vascular Surgery

## 2016-04-15 ENCOUNTER — Ambulatory Visit (INDEPENDENT_AMBULATORY_CARE_PROVIDER_SITE_OTHER): Payer: Medicaid Other

## 2016-04-15 VITALS — BP 131/83 | HR 58 | Resp 16 | Ht 69.0 in | Wt 185.0 lb

## 2016-04-15 DIAGNOSIS — I739 Peripheral vascular disease, unspecified: Secondary | ICD-10-CM

## 2016-04-15 DIAGNOSIS — Z95820 Peripheral vascular angioplasty status with implants and grafts: Secondary | ICD-10-CM

## 2016-04-15 DIAGNOSIS — Z959 Presence of cardiac and vascular implant and graft, unspecified: Secondary | ICD-10-CM

## 2016-04-18 ENCOUNTER — Telehealth (INDEPENDENT_AMBULATORY_CARE_PROVIDER_SITE_OTHER): Payer: Self-pay

## 2016-04-18 DIAGNOSIS — I739 Peripheral vascular disease, unspecified: Secondary | ICD-10-CM | POA: Insufficient documentation

## 2016-04-18 NOTE — Progress Notes (Signed)
Subjective:    Patient ID: Theresa Phelps, female    DOB: February 05, 1960, 56 y.o.   MRN: [id number] Chief Complaint  Patient presents with  . Re-evaluation    Ultrasound follow up   Called patient to discuss results with patient. ABI is normal. Duplex with patent stent, triphasic blood flow distally, Biphasic in peroneal / anterior tibial arteries. Patient without claudication, rest pain or ulceration. Follow up six months.   HPI  Review of Systems     Objective:   Physical Exam  BP 131/83 (BP Location: Right Arm)   Pulse (!) 58   Resp 16   Ht 5\' 9"  (1.753 m)   Wt 185 lb (83.9 kg)   BMI 27.32 kg/m   Past Medical History:  Diagnosis Date  . Acid reflux   . Arthritis   . Peripheral vascular disease [facility])    Social History   Social History  . Marital status: Single    Spouse name: N/A  . Number of children: N/A  . Years of education: N/A   Occupational History  . Not on file.   Social History Main Topics  . Smoking status: Current Every Day Smoker    Packs/day: 0.50    Years: 20.00    Types: Cigarettes  . Smokeless tobacco: Never Used  . Alcohol use Yes     Comment: twice week  . Drug use: No  . Sexual activity: Yes    Birth control/ protection: Post-menopausal   Other Topics Concern  . Not on file   Social History Narrative  . No narrative on file   Past Surgical History:  Procedure Laterality Date  . BREAST BIOPSY Left 2014  . LOWER EXTREMITY ANGIOGRAPHY Left 05/28/2015   Procedure: Lower Extremity Angiography;  Surgeon: Algernon Huxley, MD;  Location: [place] [facility];  Service: Cardiovascular;  Laterality: Left;  . PERIPHERAL VASCULAR CATHETERIZATION Right 07/16/2015   Procedure: Lower Extremity Angiography;  Surgeon: Algernon Huxley, MD;  Location: [place] [facility];  Service: Cardiovascular;  Laterality: Right;  . PERIPHERAL VASCULAR CATHETERIZATION  07/16/2015   Procedure: Lower Extremity Intervention;  Surgeon: Algernon Huxley, MD;  Location: [place] [facility];  Service: Cardiovascular;;  . WISDOM TOOTH EXTRACTION     No family history on file.  Allergies  Allergen Reactions  . Shellfish Allergy Anaphylaxis  . Banana     Other reaction(s): Other (See Comments) Burning in the mouth  . Garlic     Other reaction(s): Other (See Comments) Pt states arms freeze up and can't talk but no SOB  . Fish-Derived Products Rash      Assessment & Plan:  Called patient to discuss results with patient. ABI is normal. Duplex with patent stent, triphasic blood flow distally, Biphasic in peroneal / anterior tibial arteries. Patient without claudication, rest pain or ulceration. Follow up six months.   1. PAD (peripheral artery disease) (HCC)  - VAS [place] ABI WITH/WO TBI; Future - VAS [place] LOWER EXTREMITY ARTERIAL DUPLEX; Future  Current Outpatient Prescriptions on File Prior to Visit  Medication Sig Dispense Refill  . albuterol (PROVENTIL HFA;VENTOLIN HFA) 108 (90 Base) MCG/ACT inhaler Inhale 1-2 puffs into the lungs every 6 (six) hours as needed for wheezing or shortness of breath. 1 Inhaler 0  . benzonatate (TESSALON) 200 MG capsule Take 1 capsule (200 mg total) by mouth 3 (three) times daily as needed for cough. 30 capsule 1  . cetirizine (ZYRTEC) 10 MG tablet Take 10 mg  by mouth daily. Reported on 05/28/2015    . clopidogrel (PLAVIX) 75 MG tablet Take 1 tablet (75 mg total) by mouth daily. 30 tablet 11  . ibuprofen (ADVIL,MOTRIN) 200 MG tablet Take 400 mg by mouth as needed for mild pain. Reported on 05/28/2015    . meloxicam (MOBIC) 7.5 MG tablet Take 1 tablet (7.5 mg total) by mouth daily as needed for pain. Take with food 5 tablet 1  . naproxen (NAPROSYN) 375 MG tablet Take 1 tablet (375 mg total) by mouth 2 (two) times daily. As needed for knee pain 20 tablet 0  . simvastatin (ZOCOR) 20 MG tablet Take 1 tablet (20 mg total) by mouth daily at 6 PM. 30 tablet 5  . furosemide (LASIX) 20 MG tablet Take 1 tablet (20 mg total) by mouth daily. 3  tablet 0   No current facility-administered medications on file prior to visit.    There are no Patient Instructions on file for this visit. No Follow-up on file.  Geffrey Michaelsen A Tilmon Wisehart, PA-C

## 2016-04-18 NOTE — Telephone Encounter (Signed)
I called to discuss results. I was unable to leave a message as the mailbox was full.

## 2016-04-18 NOTE — Telephone Encounter (Signed)
I called to discuss her results and was unable to leave a message as her mailbox was full.

## 2016-04-18 NOTE — Telephone Encounter (Signed)
Patient called requesting her results from her ultrasound  last week. She was told on Friday and today that you are in clinic and that you will call her with the results as soon as you can.

## 2016-04-23 ENCOUNTER — Other Ambulatory Visit (INDEPENDENT_AMBULATORY_CARE_PROVIDER_SITE_OTHER): Payer: Self-pay | Admitting: Vascular Surgery

## 2016-08-17 ENCOUNTER — Other Ambulatory Visit (INDEPENDENT_AMBULATORY_CARE_PROVIDER_SITE_OTHER): Payer: Self-pay | Admitting: Vascular Surgery

## 2016-08-23 ENCOUNTER — Ambulatory Visit (INDEPENDENT_AMBULATORY_CARE_PROVIDER_SITE_OTHER): Payer: Medicaid Other | Admitting: Vascular Surgery

## 2016-08-23 ENCOUNTER — Encounter (INDEPENDENT_AMBULATORY_CARE_PROVIDER_SITE_OTHER): Payer: Self-pay | Admitting: Vascular Surgery

## 2016-08-23 VITALS — BP 145/87 | HR 89 | Resp 16 | Ht 66.5 in | Wt 180.0 lb

## 2016-08-23 DIAGNOSIS — I739 Peripheral vascular disease, unspecified: Secondary | ICD-10-CM | POA: Diagnosis not present

## 2016-08-23 DIAGNOSIS — M79642 Pain in left hand: Secondary | ICD-10-CM | POA: Diagnosis not present

## 2016-08-23 DIAGNOSIS — F17200 Nicotine dependence, unspecified, uncomplicated: Secondary | ICD-10-CM | POA: Diagnosis not present

## 2016-08-23 DIAGNOSIS — M79641 Pain in right hand: Secondary | ICD-10-CM | POA: Diagnosis not present

## 2016-08-23 NOTE — Assessment & Plan Note (Signed)
Represents some atherosclerotic risk factor and cessation would be of benefit.

## 2016-08-23 NOTE — Progress Notes (Signed)
MRN : [id number]  Theresa Phelps is a 56 y.o. ([birth year]) female who presents with chief complaint of  Chief Complaint  Patient presents with  . Follow-up    Hand pain/Vein swelling  .  History of Present Illness: Patient returns today in follow up prior to her scheduled visit. She has known severe peripheral arterial disease and last year we did staged bilateral lower extremity interventions. We checked those a few months ago and they are doing well. She denies any ischemic rest pain, ulceration, or lifestyle limiting claudication of either lower extremity. Her complaint today is in her hands. Her hands are weak and numb. She is having difficulty using her hands and doing things such as turning a doorknob or simple tasks. She has around-the-clock pain that is severe and bothersome in both hands. She does not have ulceration or infection. She's had no previous upper extremity arterial evaluation or procedures to her knowledge. She continues to smoke. She denies fever or chills.  Current Outpatient Prescriptions  Medication Sig Dispense Refill  . albuterol (PROVENTIL HFA;VENTOLIN HFA) 108 (90 Base) MCG/ACT inhaler Inhale 1-2 puffs into the lungs every 6 (six) hours as needed for wheezing or shortness of breath. 1 Inhaler 0  . benzonatate (TESSALON) 200 MG capsule Take 1 capsule (200 mg total) by mouth 3 (three) times daily as needed for cough. 30 capsule 1  . cetirizine (ZYRTEC) 10 MG tablet Take 10 mg by mouth daily. Reported on 05/28/2015    . clopidogrel (PLAVIX) 75 MG tablet TAKE 1 TABLET BY MOUTH EVERY DAY 30 tablet 0  . furosemide (LASIX) 20 MG tablet Take 1 tablet (20 mg total) by mouth daily. 3 tablet 0  . ibuprofen (ADVIL,MOTRIN) 200 MG tablet Take 400 mg by mouth as needed for mild pain. Reported on 05/28/2015    . meloxicam (MOBIC) 7.5 MG tablet Take 1 tablet (7.5 mg total) by mouth daily as needed for pain. Take with food 5 tablet 1  . naproxen (NAPROSYN) 375 MG tablet Take 1  tablet (375 mg total) by mouth 2 (two) times daily. As needed for knee pain 20 tablet 0  . simvastatin (ZOCOR) 20 MG tablet TAKE 1 TABLET BY MOUTH EVERY DAY IN THE EVENING AT 6PM. 30 tablet 0   No current facility-administered medications for this visit.     Past Medical History:  Diagnosis Date  . Acid reflux   . Arthritis   . Peripheral vascular disease [facility])     Past Surgical History:  Procedure Laterality Date  . BREAST BIOPSY Left 2014  . LOWER EXTREMITY ANGIOGRAPHY Left 05/28/2015   Procedure: Lower Extremity Angiography;  Surgeon: Algernon Huxley, MD;  Location: [place] [facility];  Service: Cardiovascular;  Laterality: Left;  . PERIPHERAL VASCULAR CATHETERIZATION Right 07/16/2015   Procedure: Lower Extremity Angiography;  Surgeon: Algernon Huxley, MD;  Location: [place] [facility];  Service: Cardiovascular;  Laterality: Right;  . PERIPHERAL VASCULAR CATHETERIZATION  07/16/2015   Procedure: Lower Extremity Intervention;  Surgeon: Algernon Huxley, MD;  Location: [place] [facility];  Service: Cardiovascular;;  . WISDOM TOOTH EXTRACTION      Social History Social History  Substance Use Topics  . Smoking status: Current Every Day Smoker    Packs/day: 0.50    Years: 20.00    Types: Cigarettes  . Smokeless tobacco: Never Used  . Alcohol use Yes     Comment: twice week  No IVDU  Family History No bleeding or clotting  disorders, no aneurysms, no autoimmune disease  Allergies  Allergen Reactions  . Shellfish Allergy Anaphylaxis  . Banana     Other reaction(s): Other (See Comments) Burning in the mouth  . Garlic     Other reaction(s): Other (See Comments) Pt states arms freeze up and can't talk but no SOB  . Fish-Derived Products Rash     REVIEW OF SYSTEMS (Negative unless checked)  Constitutional: [] Weight loss  [] Fever  [] Chills Cardiac: [] Chest pain   [] Chest pressure   [] Palpitations   [] Shortness of breath when laying flat   [] Shortness of breath at rest   [] Shortness  of breath with exertion. Vascular:  [] Pain in legs with walking   [] Pain in legs at rest   [] Pain in legs when laying flat   [] Claudication   [] Pain in feet when walking  [] Pain in feet at rest  [] Pain in feet when laying flat   [] History of DVT   [] Phlebitis   [x] Swelling in legs   [] Varicose veins   [] Non-healing ulcers Pulmonary:   [] Uses home oxygen   [] Productive cough   [] Hemoptysis   [] Wheeze  [] COPD   [] Asthma Neurologic:  [] Dizziness  [] Blackouts   [] Seizures   [] History of stroke   [] History of TIA  [] Aphasia   [] Temporary blindness   [] Dysphagia   [x] Weakness or numbness in arms   [] Weakness or numbness in legs Musculoskeletal:  [x] Arthritis   [] Joint swelling   [] Joint pain   [] Low back pain Hematologic:  [] Easy bruising  [] Easy bleeding   [] Hypercoagulable state   [] Anemic   Gastrointestinal:  [] Blood in stool   [] Vomiting blood  [x] Gastroesophageal reflux/heartburn   [] Abdominal pain Genitourinary:  [] Chronic kidney disease   [] Difficult urination  [] Frequent urination  [] Burning with urination   [] Hematuria Skin:  [] Rashes   [] Ulcers   [] Wounds Psychological:  [x] History of anxiety   []  History of major depression.  Physical Examination  BP (!) 145/87 (BP Location: Right Arm)   Pulse 89   Resp 16   Ht 5' 6.5" (1.689 m)   Wt 180 lb (81.6 kg)   BMI 28.62 kg/m  Gen:  WD/WN, NAD Head: [place]/AT, No temporalis wasting. Ear/Nose/Throat: Hearing grossly intact, nares w/o erythema or drainage, trachea midline Eyes: Conjunctiva clear. Sclera non-icteric Neck: Supple.  No JVD.  Pulmonary:  Good air movement, no use of accessory muscles.  Cardiac: RRR, normal S1, S2 Vascular:  Vessel Right Left  Radial Palpable 1+ Palpable                      Popliteal Palpable Palpable  PT Trace Palpable 1+ Palpable  DP Palpable Palpable    Musculoskeletal: M/S 5/5 throughout.  No deformity or atrophy. 1+ right lower extremity edema. Right knee is quite swollen. Good capillary refill in both  hands. Neurologic: Sensation grossly intact in extremities.  Symmetrical.  Speech is fluent.  Psychiatric: Judgment intact, Mood & affect appropriate for pt's clinical situation. Dermatologic: No rashes or ulcers noted.  No cellulitis or open wounds.       Labs No results found for this or any previous visit (from the past 2160 hour(s)).  Radiology No results found.    Assessment/Plan  PAD (peripheral artery disease) (HCC) Legs are doing well after intervention, but now complaining of a lot of arm pain. Is scheduled to come back and have her legs checked in several months. We are going to obtain upper extremity arterial duplex in the near future her  convenience. Although her upper extremity symptoms sound more neuropathic in nature, given her peripheral vascular history and her ongoing tobacco use think assessment of her arterial perfusion with a detailed ultrasound would be prudent.  Tobacco use disorder Represents some atherosclerotic risk factor and cessation would be of benefit.  Bilateral hand pain We are going to obtain upper extremity arterial duplex in the near future her convenience. Although her upper extremity symptoms sound more neuropathic in nature, given her peripheral vascular history and her ongoing tobacco use think assessment of her arterial perfusion with a detailed ultrasound would be prudent.    [name], MD  08/23/2016 3:16 PM    This note was created with Dragon medical transcription system.  Any errors from dictation are purely unintentional

## 2016-08-23 NOTE — Assessment & Plan Note (Signed)
We are going to obtain upper extremity arterial duplex in the near future her convenience. Although her upper extremity symptoms sound more neuropathic in nature, given her peripheral vascular history and her ongoing tobacco use think assessment of her arterial perfusion with a detailed ultrasound would be prudent.

## 2016-08-23 NOTE — Assessment & Plan Note (Signed)
Legs are doing well after intervention, but now complaining of a lot of arm pain. Is scheduled to come back and have her legs checked in several months. We are going to obtain upper extremity arterial duplex in the near future her convenience. Although her upper extremity symptoms sound more neuropathic in nature, given her peripheral vascular history and her ongoing tobacco use think assessment of her arterial perfusion with a detailed ultrasound would be prudent.

## 2016-08-24 ENCOUNTER — Ambulatory Visit (INDEPENDENT_AMBULATORY_CARE_PROVIDER_SITE_OTHER): Payer: Medicaid Other

## 2016-08-29 ENCOUNTER — Ambulatory Visit (INDEPENDENT_AMBULATORY_CARE_PROVIDER_SITE_OTHER): Payer: Medicaid Other

## 2016-08-29 DIAGNOSIS — M79642 Pain in left hand: Secondary | ICD-10-CM | POA: Diagnosis not present

## 2016-08-29 DIAGNOSIS — M79641 Pain in right hand: Secondary | ICD-10-CM

## 2016-08-31 ENCOUNTER — Encounter (INDEPENDENT_AMBULATORY_CARE_PROVIDER_SITE_OTHER): Payer: Self-pay | Admitting: Vascular Surgery

## 2016-10-07 ENCOUNTER — Other Ambulatory Visit (INDEPENDENT_AMBULATORY_CARE_PROVIDER_SITE_OTHER): Payer: Self-pay | Admitting: Vascular Surgery

## 2017-04-16 ENCOUNTER — Other Ambulatory Visit: Payer: Self-pay

## 2017-04-16 ENCOUNTER — Ambulatory Visit: Payer: Medicaid Other

## 2017-04-16 ENCOUNTER — Ambulatory Visit
Admission: EM | Admit: 2017-04-16 | Discharge: 2017-04-16 | Disposition: A | Discharge status: Home / Self Care | Payer: Medicaid Other | Attending: Family Medicine | Admitting: Family Medicine

## 2017-04-16 ENCOUNTER — Encounter: Payer: Self-pay | Admitting: Gynecology

## 2017-04-16 DIAGNOSIS — R937 Abnormal findings on diagnostic imaging of other parts of musculoskeletal system: Secondary | ICD-10-CM | POA: Insufficient documentation

## 2017-04-16 DIAGNOSIS — M1711 Unilateral primary osteoarthritis, right knee: Secondary | ICD-10-CM | POA: Insufficient documentation

## 2017-04-16 DIAGNOSIS — G8929 Other chronic pain: Secondary | ICD-10-CM | POA: Diagnosis not present

## 2017-04-16 DIAGNOSIS — M25561 Pain in right knee: Secondary | ICD-10-CM

## 2017-04-16 DIAGNOSIS — M1811 Unilateral primary osteoarthritis of first carpometacarpal joint, right hand: Secondary | ICD-10-CM | POA: Insufficient documentation

## 2017-04-16 DIAGNOSIS — M25569 Pain in unspecified knee: Secondary | ICD-10-CM | POA: Diagnosis present

## 2017-04-16 DIAGNOSIS — M25531 Pain in right wrist: Secondary | ICD-10-CM

## 2017-04-16 HISTORY — DX: Unspecified asthma, uncomplicated: J45.909

## 2017-04-16 HISTORY — DX: Pure hypercholesterolemia, unspecified: E78.00

## 2017-04-16 MED ORDER — HYDROCODONE-ACETAMINOPHEN 5-325 MG PO TABS: ORAL_TABLET | ORAL | 0 refills | Status: DC

## 2017-04-16 NOTE — ED Provider Notes (Signed)
[facility]    CSN: [id number] Arrival date & time: 04/16/17  1257     History   Chief Complaint Chief Complaint  Patient presents with  . Knee Pain    HPI Theresa Phelps is a 58 y.o. female.   Also c/o right wrist pain and swelling. Denies any injuries, fevers, chills.   The history is provided by the patient.  Knee Pain  Location:  Knee Injury: no   Knee location:  R knee Pain details:    Quality:  Aching   Radiates to:  Does not radiate Chronicity:  Chronic Dislocation: no   Foreign body present:  No foreign bodies Prior injury to area:  No Relieved by:  None tried Worsened by:  Bearing weight Ineffective treatments:  None tried Associated symptoms: swelling   Associated symptoms: no back pain, no decreased ROM, no fatigue, no fever, no itching, no muscle weakness, no neck pain, no numbness, no stiffness and no tingling   Risk factors: no recent illness     Past Medical History:  Diagnosis Date  . Acid reflux   . Arthritis   . Asthma   . High cholesterol   . Peripheral vascular disease [facility])     Patient Active Problem List   Diagnosis Date Noted  . Tobacco use disorder 08/23/2016  . Bilateral hand pain 08/23/2016  . PAD (peripheral artery disease) ([place]) 04/18/2016  . Lump or mass in breast 06/12/2012    Past Surgical History:  Procedure Laterality Date  . BREAST BIOPSY Left 2014  . LOWER EXTREMITY ANGIOGRAPHY Left 05/28/2015   Procedure: Lower Extremity Angiography;  Surgeon: Algernon Huxley, MD;  Location: [place] [facility];  Service: Cardiovascular;  Laterality: Left;  . PERIPHERAL VASCULAR CATHETERIZATION Right 07/16/2015   Procedure: Lower Extremity Angiography;  Surgeon: Algernon Huxley, MD;  Location: [place] [facility];  Service: Cardiovascular;  Laterality: Right;  . PERIPHERAL VASCULAR CATHETERIZATION  07/16/2015   Procedure: Lower Extremity Intervention;  Surgeon: Algernon Huxley, MD;  Location: [place] [facility];  Service:  Cardiovascular;;  . WISDOM TOOTH EXTRACTION      OB History    Gravida  2   Para  2   Term      Preterm      AB      Living  2     SAB      TAB      Ectopic      Multiple      Live Births           Obstetric Comments  1st Menstrual Cycle: 11 1st Pregnancy: 18         Home Medications    Prior to Admission medications   Medication Sig Start Date End Date Taking? Authorizing Provider  albuterol (PROVENTIL HFA;VENTOLIN HFA) 108 (90 Base) MCG/ACT inhaler Inhale 1-2 puffs into the lungs every 6 (six) hours as needed for wheezing or shortness of breath. 12/30/15  Yes Wynona Luna, MD  clopidogrel (PLAVIX) 75 MG tablet TAKE 1 TABLET BY MOUTH EVERY DAY 10/10/16  Yes Dew, Erskine Squibb, MD  benzonatate (TESSALON) 200 MG capsule Take 1 capsule (200 mg total) by mouth 3 (three) times daily as needed for cough. 12/30/15   Wynona Luna, MD  cetirizine (ZYRTEC) 10 MG tablet Take 10 mg by mouth daily. Reported on 05/28/2015    [provider]  furosemide (LASIX) 20 MG tablet Take 1 tablet (20 mg total) by mouth  daily. 03/06/16 03/09/16  Harrie Foreman, MD  HYDROcodone-acetaminophen (NORCO/VICODIN) 5-325 MG tablet 1-2 tabs po bid prn 04/16/17   Norval Gable, MD  ibuprofen (ADVIL,MOTRIN) 200 MG tablet Take 400 mg by mouth as needed for mild pain. Reported on 05/28/2015    [provider]  meloxicam (MOBIC) 7.5 MG tablet Take 1 tablet (7.5 mg total) by mouth daily as needed for pain. Take with food 03/06/16   Harrie Foreman, MD  naproxen (NAPROSYN) 375 MG tablet Take 1 tablet (375 mg total) by mouth 2 (two) times daily. As needed for knee pain 12/30/15   Wynona Luna, MD  simvastatin (ZOCOR) 20 MG tablet TAKE 1 TABLET BY MOUTH EVERY DAY IN THE EVENING AT 6PM. 08/17/16   Algernon Huxley, MD    Family History Family History  Problem Relation Age of Onset  . Hypertension Mother     Social History Social History   Tobacco Use  . Smoking  status: Current Every Day Smoker    Packs/day: 0.50    Years: 20.00    Pack years: 10.00    Types: Cigarettes  . Smokeless tobacco: Never Used  Substance Use Topics  . Alcohol use: Yes    Comment: twice week  . Drug use: No     Allergies   Shellfish allergy; Banana; Garlic; and Fish-derived products   Review of Systems Review of Systems  Constitutional: Negative for fatigue and fever.  Musculoskeletal: Negative for back pain, neck pain and stiffness.  Skin: Negative for itching.     Physical Exam Triage Vital Signs ED Triage Vitals  Enc Vitals Group     BP 04/16/17 1313 109/76     Pulse Rate 04/16/17 1313 69     Resp 04/16/17 1313 16     Temp 04/16/17 1313 97.8 F (36.6 C)     Temp Source 04/16/17 1313 Oral     SpO2 04/16/17 1313 99 %     Weight 04/16/17 1311 178 lb (80.7 kg)     Height --      Head Circumference --      Peak Flow --      Pain Score 04/16/17 1311 10     Pain Loc --      Pain Edu? --      Excl. in [place]? --    No data found.  Updated Vital Signs BP 109/76 (BP Location: Left Arm)   Pulse 69   Temp 97.8 F (36.6 C) (Oral)   Resp 16   Wt 178 lb (80.7 kg)   SpO2 99%   BMI 28.30 kg/m   Visual Acuity Right Eye Distance:   Left Eye Distance:   Bilateral Distance:    Right Eye Near:   Left Eye Near:    Bilateral Near:     Physical Exam  Constitutional: She appears well-developed and well-nourished. No distress.  Musculoskeletal:       Right knee: She exhibits swelling and bony tenderness (diffuse anterior). She exhibits normal range of motion, no effusion, no ecchymosis, no deformity, no laceration, no erythema, normal alignment, no LCL laxity, normal patellar mobility, normal meniscus and no MCL laxity.       Right hand: She exhibits tenderness (over dorsum of wrist) and swelling (mild over dorsum of wrist). She exhibits normal range of motion, no bony tenderness, normal two-point discrimination, no deformity and no laceration. Normal  sensation noted. Normal strength noted.  Skin: She is not diaphoretic.  Nursing note and vitals  reviewed.    UC Treatments / Results  Labs (all labs ordered are listed, but only abnormal results are displayed) Labs Reviewed - No data to display  EKG None Radiology Dg Wrist Complete Right  Result Date: 04/16/2017 CLINICAL DATA:  Pain swelling RIGHT wrist radiating to fingers and arm, no known injury EXAM: RIGHT WRIST - COMPLETE 3+ VIEW COMPARISON:  None FINDINGS: Low normal osseous mineralization. Joint space narrowing at first [facility] joint. Remaining joint spaces preserved. No additional fracture, dislocation or bone destruction. IMPRESSION: Degenerative changes at first [facility] joint. No acute bony abnormalities Electronically Signed   By: Lavonia Dana M.D.   On: 04/16/2017 14:35   Dg Knee Complete 4 Views Right  Result Date: 04/16/2017 CLINICAL DATA:  Pain and swelling RIGHT knee, no known injury EXAM: RIGHT KNEE - COMPLETE 4+ VIEW COMPARISON:  04/17/2006 FINDINGS: Osseous demineralization. Scattered degenerative changes with minimal joint space narrowing and spur formation, greatest at patellofemoral joint. Question calcified body at suprapatellar recess. No acute fracture, dislocation or bone destruction. Multiple vascular stents identified in the RIGHT thigh. No knee joint effusion. IMPRESSION: Osteoarthritic changes RIGHT knee greatest at patellofemoral joint. Question calcified loose body at suprapatellar recess. No acute abnormalities. Electronically Signed   By: Lavonia Dana M.D.   On: 04/16/2017 14:34    Procedures Procedures (including critical care time)  Medications Ordered in UC Medications - No data to display   Initial Impression / Assessment and Plan / UC Course  I have reviewed the triage vital signs and the nursing notes.  Pertinent labs & imaging results that were available during my care of the patient were reviewed by me and considered in my medical decision making (see  chart for details).       Final Clinical Impressions(s) / UC Diagnoses   Final diagnoses:  Chronic pain of right knee  Chronic wrist pain, right  Arthritis of right knee    ED Discharge Orders        Ordered    HYDROcodone-acetaminophen (NORCO/VICODIN) 5-325 MG tablet     04/16/17 1446     1. x-ray results and diagnosis reviewed with patient 2. rx as per orders above; reviewed possible side effects, interactions, risks and benefits  3. Recommend supportive treatment with ice, otc analgesics prn 4. Follow-up prn if symptoms worsen or don't improve  Controlled Substance Prescriptions [place] Controlled Substance Registry consulted? Not Applicable   Norval Gable, MD 04/16/17 [phone]

## 2017-04-16 NOTE — ED Triage Notes (Signed)
Right knee swelling on and off x 1 year. Per patient has bilateral stent placement in both legs x 2 yrs ago.

## 2017-05-31 ENCOUNTER — Telehealth (INDEPENDENT_AMBULATORY_CARE_PROVIDER_SITE_OTHER): Payer: Self-pay

## 2017-05-31 DIAGNOSIS — R7303 Prediabetes: Secondary | ICD-10-CM | POA: Insufficient documentation

## 2017-05-31 NOTE — Telephone Encounter (Signed)
Dr Deirdre Peer called stating that she examined the patient today and she noticed that the patient had some bruit noise coming from her left upper scapula area and she is concerned that it maybe an aortic issue or something.  She is going to have her nurses fax over her notes from today's visit so that you can look them over and get to the bottom of what's going on with this patient. She would like a call back from you once you have examined the patient during her upcoming visit.

## 2017-06-01 ENCOUNTER — Other Ambulatory Visit (INDEPENDENT_AMBULATORY_CARE_PROVIDER_SITE_OTHER): Payer: Self-pay | Admitting: Vascular Surgery

## 2017-06-01 ENCOUNTER — Ambulatory Visit (INDEPENDENT_AMBULATORY_CARE_PROVIDER_SITE_OTHER): Payer: Medicaid Other

## 2017-06-01 ENCOUNTER — Ambulatory Visit (INDEPENDENT_AMBULATORY_CARE_PROVIDER_SITE_OTHER): Payer: Medicaid Other | Admitting: Vascular Surgery

## 2017-06-01 ENCOUNTER — Encounter (INDEPENDENT_AMBULATORY_CARE_PROVIDER_SITE_OTHER): Payer: Self-pay | Admitting: Vascular Surgery

## 2017-06-01 VITALS — BP 124/81 | HR 56 | Resp 13 | Ht 67.0 in | Wt 175.0 lb

## 2017-06-01 DIAGNOSIS — I6523 Occlusion and stenosis of bilateral carotid arteries: Secondary | ICD-10-CM

## 2017-06-01 DIAGNOSIS — F17200 Nicotine dependence, unspecified, uncomplicated: Secondary | ICD-10-CM

## 2017-06-01 DIAGNOSIS — E785 Hyperlipidemia, unspecified: Secondary | ICD-10-CM | POA: Diagnosis not present

## 2017-06-01 DIAGNOSIS — R0989 Other specified symptoms and signs involving the circulatory and respiratory systems: Secondary | ICD-10-CM

## 2017-06-01 DIAGNOSIS — I739 Peripheral vascular disease, unspecified: Secondary | ICD-10-CM | POA: Diagnosis not present

## 2017-06-01 NOTE — Telephone Encounter (Signed)
The patient was seen today in the office, and she did have a Carotid Ultrasound done.

## 2017-06-01 NOTE — Progress Notes (Signed)
Subjective:    Patient ID: Theresa Phelps, female    DOB: 09-26-1960, 57 y.o.   MRN: [id number] Chief Complaint  Patient presents with  . Follow-up    ABI and Carotid check   Patient presents for a yearly peripheral artery disease and carotid artery stenosis follow-up.  The patient was recently seen by her primary care physician at [facility] who heard a suspicious sound emanating from the patient's scapula.  This prompted a phone call to her office and a carotid artery duplex was added onto the patient's annual peripheral artery disease follow-up study.  The patient denies any claudication-like symptoms, rest pain or ulceration to the bilateral lower extremity.  The patient denies any amaurosis fugax or neurological symptoms.  The patient underwent a bilateral ABI which was essentially unchanged when compared to the prior study on April 15, 2016.  There is no evidence of significant bilateral lower extremity arterial disease.  The bilateral toe brachial indices are normal.  Right ABI: 1.17 and Left ABI: 1.18.  The patient underwent a bilateral carotid duplex which is notable for right ICA stenosis consistent with a 40 to 59% narrowing.  Left internal carotid artery was consistent with 1 to 39% stenosis.  Bilateral vertebral arteries demonstrate antegrade flow.  Normal flow hemodynamics were seen in the bilateral subclavian arteries.  The patient notes that she is going to be undergoing a CT of the chest in "a few days" to assess what possibly can be causing her primary care auscultated during her recent visit.  Patient denies any shortness of breath or chest pain.  Patient denies any fever, nausea vomiting.  Review of Systems  Constitutional: Negative.   HENT: Negative.   Eyes: Negative.   Respiratory: Negative.   Cardiovascular:       PAD Carotid artery stenosis  Gastrointestinal: Negative.   Endocrine: Negative.   Genitourinary: Negative.   Musculoskeletal: Negative.   Skin: Negative.     Allergic/Immunologic: Negative.   Neurological: Negative.   Hematological: Negative.   Psychiatric/Behavioral: Negative.       Objective:   Physical Exam  Constitutional: She is oriented to person, place, and time. She appears well-developed and well-nourished. No distress.  HENT:  Head: Normocephalic and atraumatic.  Right Ear: External ear normal.  Left Ear: External ear normal.  Eyes: Pupils are equal, round, and reactive to light. EOM are normal.  Neck:  No carotid bruits auscultated on exam  Cardiovascular: Normal rate, regular rhythm, normal heart sounds and intact distal pulses.  Pulses:      Femoral pulses are 2+ on the right side, and 2+ on the left side.      Dorsalis pedis pulses are 1+ on the right side, and 1+ on the left side.       Posterior tibial pulses are 1+ on the right side, and 1+ on the left side.  Pulmonary/Chest: Effort normal and breath sounds normal.  Musculoskeletal: Normal range of motion. She exhibits no edema.  Neurological: She is alert and oriented to person, place, and time.  Skin: Skin is warm and dry. She is not diaphoretic.  Psychiatric: She has a normal mood and affect. Her behavior is normal. Judgment and thought content normal.  Vitals reviewed.  BP 124/81 (BP Location: Right Arm, Patient Position: Sitting)   Pulse (!) 56   Resp 13   Ht 5\' 7"  (1.702 m)   Wt 175 lb (79.4 kg)   BMI 27.41 kg/m   Past Medical History:  Diagnosis  Date  . Acid reflux   . Arthritis   . Asthma   . High cholesterol   . Peripheral vascular disease ([place])    Social History   Socioeconomic History  . Marital status: Single    Spouse name: Not on file  . Number of children: Not on file  . Years of education: Not on file  . Highest education level: Not on file  Occupational History  . Not on file  Social Needs  . Financial resource strain: Not on file  . Food insecurity:    Worry: Not on file    Inability: Not on file  . Transportation needs:     Medical: Not on file    Non-medical: Not on file  Tobacco Use  . Smoking status: Current Every Day Smoker    Packs/day: 0.50    Years: 20.00    Pack years: 10.00    Types: Cigarettes  . Smokeless tobacco: Never Used  Substance and Sexual Activity  . Alcohol use: Yes    Comment: twice week  . Drug use: No  . Sexual activity: Yes    Birth control/protection: Post-menopausal  Lifestyle  . Physical activity:    Days per week: Not on file    Minutes per session: Not on file  . Stress: Not on file  Relationships  . Social connections:    Talks on phone: Not on file    Gets together: Not on file    Attends religious service: Not on file    Active member of club or organization: Not on file    Attends meetings of clubs or organizations: Not on file    Relationship status: Not on file  . Intimate partner violence:    Fear of current or ex partner: Not on file    Emotionally abused: Not on file    Physically abused: Not on file    Forced sexual activity: Not on file  Other Topics Concern  . Not on file  Social History Narrative  . Not on file   Past Surgical History:  Procedure Laterality Date  . BREAST BIOPSY Left 2014  . LOWER EXTREMITY ANGIOGRAPHY Left 05/28/2015   Procedure: Lower Extremity Angiography;  Surgeon: Algernon Huxley, MD;  Location: [place] [facility];  Service: Cardiovascular;  Laterality: Left;  . PERIPHERAL VASCULAR CATHETERIZATION Right 07/16/2015   Procedure: Lower Extremity Angiography;  Surgeon: Algernon Huxley, MD;  Location: [place] [facility];  Service: Cardiovascular;  Laterality: Right;  . PERIPHERAL VASCULAR CATHETERIZATION  07/16/2015   Procedure: Lower Extremity Intervention;  Surgeon: Algernon Huxley, MD;  Location: [place] [facility];  Service: Cardiovascular;;  . WISDOM TOOTH EXTRACTION     Family History  Problem Relation Age of Onset  . Hypertension Mother    Allergies  Allergen Reactions  . Shellfish Allergy Anaphylaxis  . Banana     Other  reaction(s): Other (See Comments) Burning in the mouth  . Garlic     Other reaction(s): Other (See Comments) Pt states arms freeze up and can't talk but no SOB  . Fish-Derived Products Rash      Assessment & Plan:  Patient presents for a yearly peripheral artery disease and carotid artery stenosis follow-up.  The patient was recently seen by her primary care physician at [facility] who heard a suspicious sound emanating from the patient's scapula.  This prompted a phone call to her office and a carotid artery duplex was added onto the patient's annual peripheral  artery disease follow-up study.  The patient denies any claudication-like symptoms, rest pain or ulceration to the bilateral lower extremity.  The patient denies any amaurosis fugax or neurological symptoms.  The patient underwent a bilateral ABI which was essentially unchanged when compared to the prior study on April 15, 2016.  There is no evidence of significant bilateral lower extremity arterial disease.  The bilateral toe brachial indices are normal.  Right ABI: 1.17 and Left ABI: 1.18.  The patient underwent a bilateral carotid duplex which is notable for right ICA stenosis consistent with a 40 to 59% narrowing.  Left internal carotid artery was consistent with 1 to 39% stenosis.  Bilateral vertebral arteries demonstrate antegrade flow.  Normal flow hemodynamics were seen in the bilateral subclavian arteries.  The patient notes that she is going to be undergoing a CT of the chest in "a few days" to assess what possibly can be causing her primary care auscultated during her recent visit.  Patient denies any shortness of breath or chest pain.  Patient denies any fever, nausea vomiting.  1. Bilateral carotid artery stenosis - New Studies reviewed with patient. Patient asymptomatic with stable duplex. No intervention at this time. Patient to return in one year for surveillance carotid duplex. I have discussed with the patient at length the risk  factors for and pathogenesis of atherosclerotic disease and encouraged a healthy diet, regular exercise regimen and blood pressure / glucose control.  Patient was instructed to contact our office in the interim with problems such as arm / leg weakness or numbness, speech / swallowing difficulty or temporary monocular blindness. The patient expresses their understanding  - VAS US CAROTID; Future  2. PAD (peripheral artery disease) (HCC) - Stable Studies reviewed with patient. Patient presents today asymptomatically Physical exam is unremarkable ABI is stable I have discussed with the patient at length the risk factors for and pathogenesis of atherosclerotic disease and encouraged a healthy diet, regular exercise regimen and blood pressure / glucose control.  The patient was encouraged to call the office in the interim if he experiences any claudication like symptoms, rest pain or ulcers to his feet / toes.  - VAS [place] ABI WITH/WO TBI; Future  3. Tobacco use disorder - Stable We had a discussion for approximately 10 minutes regarding the absolute need for smoking cessation due to the deleterious nature of tobacco on the vascular system. We discussed the tobacco use would diminish patency of any intervention, and likely significantly worsen progressio of disease. We discussed multiple agents for quitting including replacement therapy or medications to reduce cravings such as Chantix. The patient voices their understanding of the importance of smoking cessation.  4. Hyperlipidemia, unspecified hyperlipidemia type - Stable Encouraged good control as its slows the progression of atherosclerotic disease  Current Outpatient Medications on File Prior to Visit  Medication Sig Dispense Refill  . albuterol (PROVENTIL HFA;VENTOLIN HFA) 108 (90 Base) MCG/ACT inhaler Inhale 1-2 puffs into the lungs every 6 (six) hours as needed for wheezing or shortness of breath. 1 Inhaler 0  . benzonatate (TESSALON) 200 MG  capsule Take 1 capsule (200 mg total) by mouth 3 (three) times daily as needed for cough. 30 capsule 1  . cetirizine (ZYRTEC) 10 MG tablet Take 10 mg by mouth daily. Reported on 05/28/2015    . clopidogrel (PLAVIX) 75 MG tablet TAKE 1 TABLET BY MOUTH EVERY DAY 30 tablet 5  . HYDROcodone-acetaminophen (NORCO/VICODIN) 5-325 MG tablet 1-2 tabs po bid prn 5 tablet 0  .  ibuprofen (ADVIL,MOTRIN) 200 MG tablet Take 400 mg by mouth as needed for mild pain. Reported on 05/28/2015    . meloxicam (MOBIC) 7.5 MG tablet Take 1 tablet (7.5 mg total) by mouth daily as needed for pain. Take with food 5 tablet 1  . naproxen (NAPROSYN) 375 MG tablet Take 1 tablet (375 mg total) by mouth 2 (two) times daily. As needed for knee pain 20 tablet 0  . simvastatin (ZOCOR) 20 MG tablet TAKE 1 TABLET BY MOUTH EVERY DAY IN THE EVENING AT 6PM. 30 tablet 0  . furosemide (LASIX) 20 MG tablet Take 1 tablet (20 mg total) by mouth daily. 3 tablet 0   No current facility-administered medications on file prior to visit.    There are no Patient Instructions on file for this visit. No follow-ups on file.  KIMBERLY A STEGMAYER, PA-C

## 2017-06-01 NOTE — Telephone Encounter (Signed)
I don't see where the patient has had a carotid duplex. Should there be one ordered soon? I'll need a order for that to go to [place] or [place]?

## 2017-06-16 ENCOUNTER — Other Ambulatory Visit (INDEPENDENT_AMBULATORY_CARE_PROVIDER_SITE_OTHER): Payer: Self-pay | Admitting: Vascular Surgery

## 2017-06-25 ENCOUNTER — Ambulatory Visit
Admission: EM | Admit: 2017-06-25 | Discharge: 2017-06-25 | Disposition: A | Discharge status: Home / Self Care | Payer: Medicaid Other | Attending: Registered Nurse | Admitting: Registered Nurse

## 2017-06-25 ENCOUNTER — Encounter: Payer: Self-pay | Admitting: Gynecology

## 2017-06-25 ENCOUNTER — Other Ambulatory Visit: Payer: Self-pay

## 2017-06-25 ENCOUNTER — Ambulatory Visit: Payer: Medicaid Other

## 2017-06-25 DIAGNOSIS — K219 Gastro-esophageal reflux disease without esophagitis: Secondary | ICD-10-CM | POA: Diagnosis not present

## 2017-06-25 DIAGNOSIS — Z79899 Other long term (current) drug therapy: Secondary | ICD-10-CM | POA: Insufficient documentation

## 2017-06-25 DIAGNOSIS — J45909 Unspecified asthma, uncomplicated: Secondary | ICD-10-CM | POA: Diagnosis not present

## 2017-06-25 DIAGNOSIS — Z7902 Long term (current) use of antithrombotics/antiplatelets: Secondary | ICD-10-CM | POA: Diagnosis not present

## 2017-06-25 DIAGNOSIS — Z8249 Family history of ischemic heart disease and other diseases of the circulatory system: Secondary | ICD-10-CM | POA: Diagnosis not present

## 2017-06-25 DIAGNOSIS — I739 Peripheral vascular disease, unspecified: Secondary | ICD-10-CM | POA: Diagnosis not present

## 2017-06-25 DIAGNOSIS — E7800 Pure hypercholesterolemia, unspecified: Secondary | ICD-10-CM | POA: Diagnosis not present

## 2017-06-25 DIAGNOSIS — W2203XA Walked into furniture, initial encounter: Secondary | ICD-10-CM | POA: Diagnosis not present

## 2017-06-25 DIAGNOSIS — I6523 Occlusion and stenosis of bilateral carotid arteries: Secondary | ICD-10-CM | POA: Diagnosis not present

## 2017-06-25 DIAGNOSIS — F17210 Nicotine dependence, cigarettes, uncomplicated: Secondary | ICD-10-CM | POA: Diagnosis not present

## 2017-06-25 DIAGNOSIS — S99822A Other specified injuries of left foot, initial encounter: Secondary | ICD-10-CM | POA: Diagnosis present

## 2017-06-25 DIAGNOSIS — S90122A Contusion of left lesser toe(s) without damage to nail, initial encounter: Secondary | ICD-10-CM

## 2017-06-25 MED ORDER — ACETAMINOPHEN 500 MG PO TABS: 1000.0000 mg | ORAL_TABLET | Freq: Four times a day (QID) | ORAL | 0 refills | Status: AC | PRN

## 2017-06-25 MED ORDER — ALBUTEROL SULFATE HFA 108 (90 BASE) MCG/ACT IN AERS: 1.0000 | INHALATION_SPRAY | RESPIRATORY_TRACT | 0 refills | Status: DC | PRN

## 2017-06-25 NOTE — Discharge Instructions (Addendum)
Elevate left foot when sitting Ice 15 minutes 4 times per day Follow up if no improvement or worsening of pain for re-evaluation in 7 to 10 days

## 2017-06-25 NOTE — ED Triage Notes (Signed)
Per patient hit left pinky toe on chair x last night.

## 2017-06-25 NOTE — ED Provider Notes (Signed)
[facility]    CSN: [id number] Arrival date & time: 06/25/17  1443     History   Chief Complaint Chief Complaint  Patient presents with  . Toe Injury    HPI Theresa Phelps is a 57 y.o. female.   57y/o african [place] female established patient banged left foot/toe into table leg last night took motrin last night 800mg  yesterday and this am along with muscle relaxant this am for pain.  Needs refill on inhaler she got from urgent care last time.  Has appt with PCM this Friday scheduled to follow up with her breathing.  Swollen, a knot, painful to walk left foot and toe.  In birkenstocks today open toed shoes     Past Medical History:  Diagnosis Date  . Acid reflux   . Arthritis   . Asthma   . High cholesterol   . Peripheral vascular disease [facility])     Patient Active Problem List   Diagnosis Date Noted  . Bilateral carotid artery stenosis 06/01/2017  . Hyperlipidemia 06/01/2017  . Tobacco use disorder 08/23/2016  . Bilateral hand pain 08/23/2016  . PAD (peripheral artery disease) ([place]) 04/18/2016  . Lump or mass in breast 06/12/2012    Past Surgical History:  Procedure Laterality Date  . BREAST BIOPSY Left 2014  . LOWER EXTREMITY ANGIOGRAPHY Left 05/28/2015   Procedure: Lower Extremity Angiography;  Surgeon: Algernon Huxley, MD;  Location: [place] [facility];  Service: Cardiovascular;  Laterality: Left;  . PERIPHERAL VASCULAR CATHETERIZATION Right 07/16/2015   Procedure: Lower Extremity Angiography;  Surgeon: Algernon Huxley, MD;  Location: [place] [facility];  Service: Cardiovascular;  Laterality: Right;  . PERIPHERAL VASCULAR CATHETERIZATION  07/16/2015   Procedure: Lower Extremity Intervention;  Surgeon: Algernon Huxley, MD;  Location: [place] [facility];  Service: Cardiovascular;;  . WISDOM TOOTH EXTRACTION      OB History    Gravida  2   Para  2   Term      Preterm      AB      Living  2     SAB      TAB      Ectopic      Multiple     Live Births           Obstetric Comments  1st Menstrual Cycle: 11 1st Pregnancy: 18         Home Medications    Prior to Admission medications   Medication Sig Start Date End Date Taking? Authorizing Provider  atorvastatin (LIPITOR) 10 MG tablet Take 10 mg by mouth daily.   Yes [provider]  cetirizine (ZYRTEC) 10 MG tablet Take 10 mg by mouth daily. Reported on 05/28/2015   Yes [provider]  clopidogrel (PLAVIX) 75 MG tablet TAKE 1 TABLET BY MOUTH EVERY DAY 06/16/17  Yes Dew, Erskine Squibb, MD  furosemide (LASIX) 20 MG tablet Take 1 tablet (20 mg total) by mouth daily. 03/06/16 06/25/17 Yes Harrie Foreman, MD  ibuprofen (ADVIL,MOTRIN) 200 MG tablet Take 400 mg by mouth as needed for mild pain. Reported on 05/28/2015   Yes [provider]  acetaminophen (TYLENOL) 500 MG tablet Take 2 tablets (1,000 mg total) by mouth every 6 (six) hours as needed. 06/25/17   Chevonne Bostrom, Aura Fey, NP  albuterol (PROVENTIL HFA;VENTOLIN HFA) 108 (90 Base) MCG/ACT inhaler Inhale 1-2 puffs into the lungs every 4 (four) hours as needed for wheezing or shortness of breath. 06/25/17  Laray Corbit, Aura Fey, NP    Family History Family History  Problem Relation Age of Onset  . Hypertension Mother     Social History Social History   Tobacco Use  . Smoking status: Current Every Day Smoker    Packs/day: 0.50    Years: 20.00    Pack years: 10.00    Types: Cigarettes  . Smokeless tobacco: Never Used  Substance Use Topics  . Alcohol use: Yes    Comment: twice week  . Drug use: No     Allergies   Shellfish allergy; Banana; Garlic; and Fish-derived products   Review of Systems Review of Systems   Physical Exam Triage Vital Signs ED Triage Vitals  Enc Vitals Group     BP 06/25/17 1458 126/74     Pulse Rate 06/25/17 1458 62     Resp 06/25/17 1458 16     Temp 06/25/17 1458 98.4 F (36.9 C)     Temp Source 06/25/17 1458 Oral     SpO2 06/25/17 1458 100 %     Weight  06/25/17 1454 172 lb (78 kg)     Height --      Head Circumference --      Peak Flow --      Pain Score 06/25/17 1454 8     Pain Loc --      Pain Edu? --      Excl. in [place]? --    No data found.  Updated Vital Signs BP 126/74 (BP Location: Left Arm)   Pulse 62   Temp 98.4 F (36.9 C) (Oral)   Resp 16   Wt 172 lb (78 kg)   SpO2 100%   BMI 26.94 kg/m    Physical Exam  Constitutional: She is oriented to person, place, and time. Vital signs are normal. She appears well-developed and well-nourished. No distress.  HENT:  Head: Normocephalic and atraumatic.  Right Ear: External ear normal.  Left Ear: External ear normal.  Nose: Nose normal.  Mouth/Throat: Oropharynx is clear and moist. No oropharyngeal exudate.  Eyes: Pupils are equal, round, and reactive to light. Conjunctivae, EOM and lids are normal. Right eye exhibits no discharge. Left eye exhibits no discharge. No scleral icterus.  Neck: Trachea normal and normal range of motion. Neck supple.  Cardiovascular: Normal rate, regular rhythm, normal heart sounds and intact distal pulses.  Pulmonary/Chest: Effort normal and breath sounds normal.  Abdominal: Soft.  Musculoskeletal: She exhibits edema, tenderness and deformity.       Left foot: There is decreased range of motion, tenderness, bony tenderness and swelling. There is normal capillary refill, no crepitus, no deformity and no laceration.       Feet:  Feet:  Right Foot:  Skin Integrity: Positive for callus and dry skin.  Left Foot:  Skin Integrity: Positive for erythema, warmth, callus and dry skin. Negative for ulcer, blister or skin breakdown.  Neurological: She is alert and oriented to person, place, and time.  Skin: Skin is warm, dry and intact. Capillary refill takes less than 2 seconds. Rash noted. No abrasion, no bruising, no burn, no ecchymosis, no laceration, no lesion, no petechiae and no purpura noted. Rash is macular. Rash is not papular, not maculopapular,  not nodular, not pustular, not vesicular and not urticarial. She is not diaphoretic. There is erythema. No cyanosis. No pallor. Nails show no clubbing.  Psychiatric: She has a normal mood and affect. Her speech is normal and behavior is normal. Judgment and thought content  normal. Cognition and memory are normal.  Nursing note reviewed.  TTP proximal phalanx left 5th, MTP joint and mid to distal 4th and 5th MTP  UC Treatments / Results  Labs (all labs ordered are listed, but only abnormal results are displayed) Labs Reviewed - No data to display  EKG None  Radiology Dg Foot Complete Left  Result Date: 06/25/2017 CLINICAL DATA:  57 y/o F; pain of the fifth toe. Blunt injury last night. EXAM: LEFT FOOT - COMPLETE 3+ VIEW COMPARISON:  03/25/2015 left foot radiographs FINDINGS: There is no evidence of fracture or dislocation. Stable lucency at the fifth proximal phalanx lateral head, possibly chronic sequelae of old trauma or erosion. Small dorsal and plantar calcaneal enthesophytes. Soft tissues are unremarkable. IMPRESSION: No acute fracture or dislocation identified. Electronically Signed   By: Kristine Garbe M.D.   On: 06/25/2017 15:49   Printed and given patient copy of results from radiology and discussed at 1610.  Procedures Procedures (including critical care time)  Medications Ordered in UC Medications - No data to display  Initial Impression / Assessment and Plan / UC Course  I have reviewed the triage vital signs and the nursing notes.  Pertinent labs & imaging results that were available during my care of the patient were reviewed by me and considered in my medical decision making (see chart for details).    To xray in wheelchair due to pain with ambulation at 1517  1610 discussed xray results old fracture no new fracture on xray per radiologist.  Suggest buddy taping for support and wearing hard sole shoes.  Keep foot elevated and ice 15 minutes 4 times per day.   Tylenol 1000mg  po QID prn pain #30 RF0.  Discussed motrin/ibuprofen/naproxen/advil/aleve can interact with her chronic medications and did not recommend taking them.  Discussed with patient if a very minor nondisplaced toe fracture buddy taping is treatment with hard soled shoe and 4-6 weeks for healing.  Recommended multivitamin with calcium and vitamin D once a day if fracture found.  Ice/elevate/rest extremity this weekend. Did not want work excuse.  Return to clinic for re-evaluation if worsening of symptoms or no improvement with plan of care reimaging in 7-10 days.  Work/sports restrictions: no running x 6 weeks or prolonged standing/walking. exitcare handout on contusion and toe fracture printed and given to patient  Patient agreed with plan of care and had no further questions at this time.   Refilled albuterol 1-2 puffs po q4-6h prn protracted cough/wheeze #1 RF0 and follow up with PCM as smoker with history of intermittent wheezing relieved with albuterol ?COPD.  Patient verbalized understanding information/instructions, agreed with plan of care and had no further questions at this time. Final Clinical Impressions(s) / UC Diagnoses   Final diagnoses:  Contusion of lesser toe of left foot without damage to nail, initial encounter     Discharge Instructions     Elevate left foot when sitting Ice 15 minutes 4 times per day Follow up if no improvement or worsening of pain for re-evaluation in 7 to 10 days    ED Prescriptions    Medication Sig Dispense Auth. Provider   albuterol (PROVENTIL HFA;VENTOLIN HFA) 108 (90 Base) MCG/ACT inhaler Inhale 1-2 puffs into the lungs every 4 (four) hours as needed for wheezing or shortness of breath. 1 Inhaler Ayo Smoak A, NP   acetaminophen (TYLENOL) 500 MG tablet Take 2 tablets (1,000 mg total) by mouth every 6 (six) hours as needed. 30 tablet Kimie Pidcock, Aura Fey,  NP     Controlled Substance Prescriptions [place] Controlled Substance Registry  consulted? Not Applicable   Olen Cordial, NP 06/25/17 1625

## 2017-07-14 ENCOUNTER — Other Ambulatory Visit (INDEPENDENT_AMBULATORY_CARE_PROVIDER_SITE_OTHER): Payer: Self-pay | Admitting: Vascular Surgery

## 2017-07-19 ENCOUNTER — Encounter (INDEPENDENT_AMBULATORY_CARE_PROVIDER_SITE_OTHER): Payer: Self-pay

## 2017-07-19 ENCOUNTER — Ambulatory Visit (INDEPENDENT_AMBULATORY_CARE_PROVIDER_SITE_OTHER): Payer: Self-pay | Admitting: Vascular Surgery

## 2017-08-04 ENCOUNTER — Other Ambulatory Visit: Payer: Self-pay | Admitting: Family Medicine

## 2017-08-04 ENCOUNTER — Ambulatory Visit
Admission: RE | Admit: 2017-08-04 | Discharge: 2017-08-04 | Disposition: A | Discharge status: Home / Self Care | Payer: Medicaid Other | Source: Ambulatory Visit | Attending: Family Medicine | Admitting: Family Medicine

## 2017-08-04 DIAGNOSIS — R0602 Shortness of breath: Secondary | ICD-10-CM

## 2017-08-04 DIAGNOSIS — I517 Cardiomegaly: Secondary | ICD-10-CM | POA: Insufficient documentation

## 2017-08-10 ENCOUNTER — Encounter: Payer: Self-pay | Admitting: Emergency Medicine

## 2017-08-10 ENCOUNTER — Ambulatory Visit
Admission: EM | Admit: 2017-08-10 | Discharge: 2017-08-10 | Disposition: A | Discharge status: Home / Self Care | Payer: Medicaid Other | Attending: Family Medicine | Admitting: Family Medicine

## 2017-08-10 ENCOUNTER — Other Ambulatory Visit: Payer: Self-pay

## 2017-08-10 DIAGNOSIS — M25561 Pain in right knee: Secondary | ICD-10-CM

## 2017-08-10 DIAGNOSIS — J069 Acute upper respiratory infection, unspecified: Secondary | ICD-10-CM

## 2017-08-10 DIAGNOSIS — G8929 Other chronic pain: Secondary | ICD-10-CM | POA: Diagnosis not present

## 2017-08-10 DIAGNOSIS — M25562 Pain in left knee: Secondary | ICD-10-CM

## 2017-08-10 MED ORDER — GUAIFENESIN-CODEINE 100-10 MG/5ML PO SYRP: 5.0000 mL | ORAL_SOLUTION | Freq: Three times a day (TID) | ORAL | 0 refills | Status: DC | PRN

## 2017-08-10 MED ORDER — BENZONATATE 200 MG PO CAPS: ORAL_CAPSULE | ORAL | 0 refills | Status: DC

## 2017-08-10 MED ORDER — FLUTICASONE PROPIONATE 50 MCG/ACT NA SUSP: 2.0000 | Freq: Every day | NASAL | 0 refills | Status: DC

## 2017-08-10 MED ORDER — ALBUTEROL SULFATE HFA 108 (90 BASE) MCG/ACT IN AERS: 1.0000 | INHALATION_SPRAY | Freq: Four times a day (QID) | RESPIRATORY_TRACT | 0 refills | Status: DC | PRN

## 2017-08-10 NOTE — ED Provider Notes (Signed)
[facility]    CSN: [id number] Arrival date & time: 08/10/17  4709     History   Chief Complaint Chief Complaint  Patient presents with  . Cough  . Knee Pain    HPI Theresa Phelps is a 57 y.o. female.   HPI 57 year old female presents today with a productive yellow sputum, cough that has worsened over the last 2 days.  She had no fever.  Seen at the [facility] on 08/04/2017 where chest x-ray was obtained showing no pulmonary disease mild cardiomegaly and atherosclerotic calcification.  She has history of PVD that is post stenting of her lower extremities.  States that she has had a stent who has been providing her transportation.  The friend had a bad cough and the patient feels she may have contacted the illness.  Is coughing at nighttime where she is not able to sleep last 2 nights and coughing so hard that she will vomit when she gags herself.  Continues to smoke.  Patient also complained of bilateral knee pain right greater than left that she is had for 1 week.  She already has a an appointment next week with an orthopedic surgeon.  Vital signs today  Afebrile, pulse rate 64 ,blood pressure 127/79, respirations 16, O2 sats at 97%       Past Medical History:  Diagnosis Date  . Acid reflux   . Arthritis    rheumatoid arthritis  . Asthma   . High cholesterol   . Peripheral vascular disease [facility])     Patient Active Problem List   Diagnosis Date Noted  . Bilateral carotid artery stenosis 06/01/2017  . Hyperlipidemia 06/01/2017  . Tobacco use disorder 08/23/2016  . Bilateral hand pain 08/23/2016  . PAD (peripheral artery disease) ([place]) 04/18/2016  . Lump or mass in breast 06/12/2012    Past Surgical History:  Procedure Laterality Date  . BREAST BIOPSY Left 2014  . LOWER EXTREMITY ANGIOGRAPHY Left 05/28/2015   Procedure: Lower Extremity Angiography;  Surgeon: Algernon Huxley, MD;  Location: [place] [facility];  Service: Cardiovascular;  Laterality:  Left;  . PERIPHERAL VASCULAR CATHETERIZATION Right 07/16/2015   Procedure: Lower Extremity Angiography;  Surgeon: Algernon Huxley, MD;  Location: [place] [facility];  Service: Cardiovascular;  Laterality: Right;  . PERIPHERAL VASCULAR CATHETERIZATION  07/16/2015   Procedure: Lower Extremity Intervention;  Surgeon: Algernon Huxley, MD;  Location: [place] [facility];  Service: Cardiovascular;;  . WISDOM TOOTH EXTRACTION      OB History    Gravida  2   Para  2   Term      Preterm      AB      Living  2     SAB      TAB      Ectopic      Multiple      Live Births           Obstetric Comments  1st Menstrual Cycle: 11 1st Pregnancy: 18         Home Medications    Prior to Admission medications   Medication Sig Start Date End Date Taking? Authorizing Provider  acetaminophen (TYLENOL) 500 MG tablet Take 2 tablets (1,000 mg total) by mouth every 6 (six) hours as needed. 06/25/17  Yes Betancourt, Aura Fey, NP  cetirizine (ZYRTEC) 10 MG tablet Take 10 mg by mouth daily. Reported on 05/28/2015   Yes [provider]  clopidogrel (PLAVIX) 75 MG  tablet TAKE 1 TABLET BY MOUTH EVERY DAY 07/14/17  Yes Dew, Erskine Squibb, MD  Fluticasone-Salmeterol (ADVAIR DISKUS) 100-50 MCG/DOSE AEPB Inhale 1 puff into the lungs 2 (two) times daily.   Yes [provider]  ibuprofen (ADVIL,MOTRIN) 200 MG tablet Take 400 mg by mouth as needed for mild pain. Reported on 05/28/2015   Yes [provider]  albuterol (PROVENTIL HFA;VENTOLIN HFA) 108 (90 Base) MCG/ACT inhaler Inhale 1-2 puffs into the lungs every 6 (six) hours as needed for wheezing or shortness of breath. Use with spacer 08/10/17   Lorin Picket, PA-C  atorvastatin (LIPITOR) 10 MG tablet Take 10 mg by mouth daily.    [provider]  benzonatate (TESSALON) 200 MG capsule Take one cap TID PRN cough 08/10/17   Lorin Picket, PA-C  fluticasone [facility]) 50 MCG/ACT nasal spray Place 2 sprays into both nostrils daily.  08/10/17   Lorin Picket, PA-C  guaiFENesin-codeine (CHERATUSSIN AC) 100-10 MG/5ML syrup Take 5 mLs by mouth 3 (three) times daily as needed for cough. 08/10/17   Lorin Picket, PA-C    Family History Family History  Problem Relation Age of Onset  . Hypertension Mother   . AAA (abdominal aortic aneurysm) Father   . Other Father        bowel obstruction    Social History Social History   Tobacco Use  . Smoking status: Current Every Day Smoker    Packs/day: 0.50    Years: 20.00    Pack years: 10.00    Types: Cigarettes  . Smokeless tobacco: Never Used  Substance Use Topics  . Alcohol use: Yes    Comment: twice week  . Drug use: No     Allergies   Shellfish allergy; Banana; Garlic; and Fish-derived products   Review of Systems Review of Systems  Constitutional: Positive for activity change. Negative for appetite change, chills, fatigue and fever.  HENT: Positive for congestion, postnasal drip and rhinorrhea.   Respiratory: Positive for cough.   Musculoskeletal: Positive for gait problem and joint swelling.  All other systems reviewed and are negative.    Physical Exam Triage Vital Signs ED Triage Vitals [08/10/17 0854]  Enc Vitals Group     BP 127/79     Pulse Rate 64     Resp 16     Temp 98.7 F (37.1 C)     Temp Source Oral     SpO2 97 %     Weight 182 lb (82.6 kg)     Height      Head Circumference      Peak Flow      Pain Score 10     Pain Loc      Pain Edu?      Excl. in [place]?    No data found.  Updated Vital Signs BP 127/79 (BP Location: Left Arm)   Pulse 64   Temp 98.7 F (37.1 C) (Oral)   Resp 16   Wt 182 lb (82.6 kg)   SpO2 97%   BMI 28.51 kg/m   Visual Acuity Right Eye Distance:   Left Eye Distance:   Bilateral Distance:    Right Eye Near:   Left Eye Near:    Bilateral Near:     Physical Exam  Constitutional: She is oriented to person, place, and time. She appears well-developed and well-nourished. No distress.  HENT:    Head: Normocephalic.  Right Ear: External ear normal.  Left Ear: External ear normal.  Nose: Nose normal.  Mouth/Throat: Oropharynx is clear and moist. No oropharyngeal exudate.  Eyes: Pupils are equal, round, and reactive to light. Right eye exhibits no discharge. Left eye exhibits no discharge.  Neck: Normal range of motion.  Pulmonary/Chest: Effort normal. She has rales.  Patient has fine non-tussive crackles in the right base  Musculoskeletal: She exhibits tenderness.  Lymphadenopathy:    She has no cervical adenopathy.  Neurological: She is alert and oriented to person, place, and time.  Skin: Skin is warm and dry. She is not diaphoretic.  Psychiatric: She has a normal mood and affect. Her behavior is normal. Judgment and thought content normal.  Nursing note and vitals reviewed.    UC Treatments / Results  Labs (all labs ordered are listed, but only abnormal results are displayed) Labs Reviewed - No data to display  EKG None  Radiology No results found.  Procedures Procedures (including critical care time)  Medications Ordered in UC Medications - No data to display  Initial Impression / Assessment and Plan / UC Course  I have reviewed the triage vital signs and the nursing notes.  Pertinent labs & imaging results that were available during my care of the patient were reviewed by me and considered in my medical decision making (see chart for details).     Plan: 1. Test/x-ray results and diagnosis reviewed with patient 2. rx as per orders; risks, benefits, potential side effects reviewed with patient 3. Recommend supportive treatment with smoking cessation.  Provide cough syrup for nighttime use so she is able to rest.  Given her Tessalon Perles for daytime use to help suppress the cough.  I will provide her with albuterol inhaler.  With her history of smoking is likely to have an element of COPD.  Use Flonase for nasal drainage for 2 to 3 weeks.  We have  encouraged her to follow-up with the orthopedic surgeon and examination of her knee was not performed at this time since she already has arranged an appointment with the orthopedist.  Continue to have the cough she should follow-up with her primary care physician at [facility]. 4. F/u prn if symptoms worsen or don't improve  Final Clinical Impressions(s) / UC Diagnoses   Final diagnoses:  Upper respiratory tract infection, unspecified type  Chronic pain of both knees   Discharge Instructions   None    ED Prescriptions    Medication Sig Dispense Auth. Provider   guaiFENesin-codeine (CHERATUSSIN AC) 100-10 MG/5ML syrup Take 5 mLs by mouth 3 (three) times daily as needed for cough. 120 mL Crecencio Mc P, PA-C   benzonatate (TESSALON) 200 MG capsule Take one cap TID PRN cough 30 capsule Crecencio Mc P, PA-C   albuterol (PROVENTIL HFA;VENTOLIN HFA) 108 (90 Base) MCG/ACT inhaler Inhale 1-2 puffs into the lungs every 6 (six) hours as needed for wheezing or shortness of breath. Use with spacer 1 Inhaler Crecencio Mc P, PA-C   fluticasone (FLONASE) 50 MCG/ACT nasal spray Place 2 sprays into both nostrils daily. 16 g Lorin Picket, PA-C     Controlled Substance Prescriptions [place] Controlled Substance Registry consulted? Not Applicable   Lorin Picket, PA-C 08/10/17 3419

## 2017-08-10 NOTE — ED Triage Notes (Addendum)
Patient in today c/o productive cough (yellow) x 2 days. Patient denies fever. Patient has tried OTC Ibuprofen. Patient hasn't been able to sleep for the cough and she coughs so hard it has called emesis. Patient had a CXR on 08/04/17 ordered by PCP at [facility].  Patient also c/o bilateral knee R > L x 1 week. Patient states her knees do hurt and swell off & on due to Rheumatoid Arthritis.

## 2017-08-28 ENCOUNTER — Other Ambulatory Visit (INDEPENDENT_AMBULATORY_CARE_PROVIDER_SITE_OTHER): Payer: Self-pay | Admitting: Vascular Surgery

## 2017-10-10 ENCOUNTER — Other Ambulatory Visit (INDEPENDENT_AMBULATORY_CARE_PROVIDER_SITE_OTHER): Payer: Self-pay | Admitting: Vascular Surgery

## 2017-11-07 ENCOUNTER — Ambulatory Visit
Admission: RE | Admit: 2017-11-07 | Discharge: 2017-11-07 | Disposition: A | Discharge status: Home / Self Care | Payer: Medicaid Other | Source: Ambulatory Visit | Attending: Internal Medicine | Admitting: Internal Medicine

## 2017-11-07 ENCOUNTER — Other Ambulatory Visit: Payer: Self-pay

## 2017-11-07 ENCOUNTER — Encounter
Admission: RE | Disposition: A | Discharge status: Home / Self Care | Payer: Self-pay | Source: Ambulatory Visit | Attending: Internal Medicine

## 2017-11-07 DIAGNOSIS — I6523 Occlusion and stenosis of bilateral carotid arteries: Secondary | ICD-10-CM | POA: Insufficient documentation

## 2017-11-07 DIAGNOSIS — I340 Nonrheumatic mitral (valve) insufficiency: Secondary | ICD-10-CM | POA: Diagnosis present

## 2017-11-07 DIAGNOSIS — I341 Nonrheumatic mitral (valve) prolapse: Secondary | ICD-10-CM | POA: Insufficient documentation

## 2017-11-07 DIAGNOSIS — Z79899 Other long term (current) drug therapy: Secondary | ICD-10-CM | POA: Diagnosis not present

## 2017-11-07 DIAGNOSIS — R7303 Prediabetes: Secondary | ICD-10-CM | POA: Insufficient documentation

## 2017-11-07 DIAGNOSIS — R05 Cough: Secondary | ICD-10-CM | POA: Insufficient documentation

## 2017-11-07 DIAGNOSIS — I2510 Atherosclerotic heart disease of native coronary artery without angina pectoris: Secondary | ICD-10-CM | POA: Diagnosis not present

## 2017-11-07 DIAGNOSIS — Z7951 Long term (current) use of inhaled steroids: Secondary | ICD-10-CM | POA: Diagnosis not present

## 2017-11-07 DIAGNOSIS — E785 Hyperlipidemia, unspecified: Secondary | ICD-10-CM | POA: Diagnosis not present

## 2017-11-07 DIAGNOSIS — I272 Pulmonary hypertension, unspecified: Secondary | ICD-10-CM | POA: Insufficient documentation

## 2017-11-07 DIAGNOSIS — I739 Peripheral vascular disease, unspecified: Secondary | ICD-10-CM | POA: Diagnosis not present

## 2017-11-07 HISTORY — PX: RIGHT/LEFT HEART CATH AND CORONARY ANGIOGRAPHY: CATH118266

## 2017-11-07 HISTORY — PX: TEE WITHOUT CARDIOVERSION: SHX5443

## 2017-11-07 SURGERY — RIGHT/LEFT HEART CATH AND CORONARY ANGIOGRAPHY
Anesthesia: Moderate Sedation | Laterality: Bilateral

## 2017-11-07 SURGERY — ECHOCARDIOGRAM, TRANSESOPHAGEAL
Anesthesia: Moderate Sedation

## 2017-11-07 MED ORDER — MIDAZOLAM HCL 5 MG/5ML IJ SOLN
INTRAMUSCULAR | Status: AC
  Filled 2017-11-07: qty 5

## 2017-11-07 MED ORDER — SODIUM CHLORIDE 0.9 % IV SOLN: INTRAVENOUS | Status: DC

## 2017-11-07 MED ORDER — SODIUM CHLORIDE 0.9 % WEIGHT BASED INFUSION: 3.0000 mL/kg/h | INTRAVENOUS | Status: AC

## 2017-11-07 MED ORDER — SODIUM CHLORIDE 0.9% FLUSH: 3.0000 mL | Freq: Two times a day (BID) | INTRAVENOUS | Status: DC

## 2017-11-07 MED ORDER — FAMOTIDINE IN NACL 20-0.9 MG/50ML-% IV SOLN
20.0000 mg | Freq: Once | INTRAVENOUS | Status: AC
  Administered 2017-11-07: 20 mg via INTRAVENOUS
  Filled 2017-11-07: qty 50

## 2017-11-07 MED ORDER — BUTAMBEN-TETRACAINE-BENZOCAINE 2-2-14 % EX AERO
INHALATION_SPRAY | CUTANEOUS | Status: AC
  Administered 2017-11-07: 3
  Filled 2017-11-07: qty 5

## 2017-11-07 MED ORDER — LIDOCAINE VISCOUS HCL 2 % MT SOLN
OROMUCOSAL | Status: AC
  Administered 2017-11-07: 15 mL
  Filled 2017-11-07: qty 15

## 2017-11-07 MED ORDER — DIPHENHYDRAMINE HCL 50 MG/ML IJ SOLN
INTRAMUSCULAR | Status: DC | PRN
  Administered 2017-11-07: 25 mg via INTRAVENOUS

## 2017-11-07 MED ORDER — ASPIRIN 81 MG PO CHEW: 81.0000 mg | CHEWABLE_TABLET | ORAL | Status: DC

## 2017-11-07 MED ORDER — ONDANSETRON HCL 4 MG/2ML IJ SOLN: 4.0000 mg | Freq: Four times a day (QID) | INTRAMUSCULAR | Status: DC | PRN

## 2017-11-07 MED ORDER — SODIUM CHLORIDE FLUSH 0.9 % IV SOLN
INTRAVENOUS | Status: AC
  Filled 2017-11-07: qty 10

## 2017-11-07 MED ORDER — SODIUM CHLORIDE 0.9 % IV SOLN: 250.0000 mL | INTRAVENOUS | Status: DC | PRN

## 2017-11-07 MED ORDER — FENTANYL CITRATE (PF) 100 MCG/2ML IJ SOLN
INTRAMUSCULAR | Status: AC
  Filled 2017-11-07: qty 2

## 2017-11-07 MED ORDER — MIDAZOLAM HCL 5 MG/5ML IJ SOLN
INTRAMUSCULAR | Status: AC | PRN
  Administered 2017-11-07 (×4): 1 mg via INTRAVENOUS
  Administered 2017-11-07: 2 mg via INTRAVENOUS

## 2017-11-07 MED ORDER — SODIUM CHLORIDE 0.9 % WEIGHT BASED INFUSION: 1.0000 mL/kg/h | INTRAVENOUS | Status: DC

## 2017-11-07 MED ORDER — SODIUM CHLORIDE 0.9% FLUSH: 3.0000 mL | INTRAVENOUS | Status: DC | PRN

## 2017-11-07 MED ORDER — METHYLPREDNISOLONE SODIUM SUCC 125 MG IJ SOLR
125.0000 mg | Freq: Once | INTRAMUSCULAR | Status: AC
  Administered 2017-11-07: 125 mg via INTRAVENOUS

## 2017-11-07 MED ORDER — ACETAMINOPHEN 325 MG PO TABS: 650.0000 mg | ORAL_TABLET | ORAL | Status: DC | PRN

## 2017-11-07 MED ORDER — FENTANYL CITRATE (PF) 100 MCG/2ML IJ SOLN
INTRAMUSCULAR | Status: AC | PRN
  Administered 2017-11-07: 50 ug via INTRAVENOUS
  Administered 2017-11-07 (×2): 25 ug via INTRAVENOUS

## 2017-11-07 MED ORDER — DIPHENHYDRAMINE HCL 50 MG/ML IJ SOLN
INTRAMUSCULAR | Status: AC
  Filled 2017-11-07: qty 1

## 2017-11-07 MED ORDER — ASPIRIN 81 MG PO CHEW
81.0000 mg | CHEWABLE_TABLET | ORAL | Status: DC
  Filled 2017-11-07: qty 1

## 2017-11-07 MED ORDER — METHYLPREDNISOLONE SODIUM SUCC 125 MG IJ SOLR
INTRAMUSCULAR | Status: AC
  Filled 2017-11-07: qty 2

## 2017-11-07 SURGICAL SUPPLY — 14 items
CATH INFINITI 5FR ANG PIGTAIL (CATHETERS) ×2 IMPLANT
CATH INFINITI 5FR JL4 (CATHETERS) ×2 IMPLANT
CATH INFINITI JR4 5F (CATHETERS) ×2 IMPLANT
CATH SWANZ 7F THERMO (CATHETERS) ×2 IMPLANT
DEVICE CLOSURE MYNXGRIP 5F (Vascular Products) ×2 IMPLANT
DEVICE SAFEGUARD 24CM (GAUZE/BANDAGES/DRESSINGS) ×2 IMPLANT
GUIDEWIRE EMER 3M J .025X150CM (WIRE) ×2 IMPLANT
KIT MANI 3VAL PERCEP (MISCELLANEOUS) ×2 IMPLANT
KIT RIGHT HEART (MISCELLANEOUS) ×2 IMPLANT
NEEDLE PERC 18GX7CM (NEEDLE) ×2 IMPLANT
PACK CARDIAC CATH (CUSTOM PROCEDURE TRAY) ×2 IMPLANT
SHEATH AVANTI 5FR X 11CM (SHEATH) ×2 IMPLANT
SHEATH AVANTI 7FRX11 (SHEATH) ×2 IMPLANT
WIRE GUIDERIGHT .035X150 (WIRE) ×2 IMPLANT

## 2017-11-07 NOTE — CV Procedure (Signed)
Transesophageal echocardiogram preliminary report  Theresa Phelps [id number] 01-03-1961  Preliminary diagnosis Symptomatic valvular heart disease  Postprocedural diagnosis Significant mitral valve prolapse with regurgitation  Time out A timeout was performed by the nursing staff and physicians specifically identifying the procedure performed, identification of the patient, the type of sedation, all allergies and medications, all pertinent medical history, and presedation assessment of nasopharynx. The patient and or family understand the risks of the procedure including the rare risks of death, stroke, heart attack, esophogeal perforation, sore throat, and reaction to medications given.  Moderate sedation During this procedure the patient has received Versed 4 milligrams and fentanyl 100 micrograms to achieve appropriate moderate sedation.  The patient had continued monitoring of heart rate, oxygenation, blood pressure, respiratory rate, and extent of signs of sedation throughout the entire procedure.  The patient received this moderate sedation over a period of 28 minutes.  Both the nursing staff and I were present during the procedure when the patient had moderate sedation for 100% of the time.  Treatment considerations  Further consideration of surgical intervention of significant valvular heart disease  For further details of transesophageal echocardiogram please refer to final report.  Signed,  Corey Skains M.D. [facility] 11/07/2017 9:00 AM

## 2017-11-07 NOTE — Progress Notes (Signed)
*  PRELIMINARY RESULTS* Echocardiogram Echocardiogram Transesophageal has been performed.  Sherrie Sport 11/07/2017, 8:26 AM

## 2017-11-10 DIAGNOSIS — I2510 Atherosclerotic heart disease of native coronary artery without angina pectoris: Secondary | ICD-10-CM | POA: Insufficient documentation

## 2017-11-17 ENCOUNTER — Other Ambulatory Visit: Payer: Self-pay | Admitting: Family Medicine

## 2017-11-24 ENCOUNTER — Other Ambulatory Visit: Payer: Self-pay | Admitting: Physical Therapy

## 2017-12-07 ENCOUNTER — Other Ambulatory Visit: Payer: Self-pay | Admitting: Family Medicine

## 2017-12-07 DIAGNOSIS — R1909 Other intra-abdominal and pelvic swelling, mass and lump: Secondary | ICD-10-CM

## 2017-12-13 DIAGNOSIS — Z951 Presence of aortocoronary bypass graft: Secondary | ICD-10-CM

## 2017-12-13 DIAGNOSIS — Z952 Presence of prosthetic heart valve: Secondary | ICD-10-CM

## 2017-12-13 HISTORY — PX: MITRAL VALVE REPLACEMENT: SHX147

## 2017-12-13 HISTORY — DX: Presence of aortocoronary bypass graft: Z95.1

## 2017-12-13 HISTORY — PX: CORONARY ARTERY BYPASS GRAFT: SHX141

## 2017-12-13 HISTORY — DX: Presence of prosthetic heart valve: Z95.2

## 2017-12-14 DIAGNOSIS — Z951 Presence of aortocoronary bypass graft: Secondary | ICD-10-CM | POA: Insufficient documentation

## 2017-12-15 DIAGNOSIS — I4891 Unspecified atrial fibrillation: Secondary | ICD-10-CM | POA: Insufficient documentation

## 2017-12-17 DIAGNOSIS — I440 Atrioventricular block, first degree: Secondary | ICD-10-CM | POA: Insufficient documentation

## 2017-12-18 DIAGNOSIS — Z7901 Long term (current) use of anticoagulants: Secondary | ICD-10-CM | POA: Insufficient documentation

## 2018-01-05 DIAGNOSIS — Z954 Presence of other heart-valve replacement: Secondary | ICD-10-CM | POA: Insufficient documentation

## 2018-01-16 DIAGNOSIS — Z95828 Presence of other vascular implants and grafts: Secondary | ICD-10-CM | POA: Insufficient documentation

## 2018-01-19 DIAGNOSIS — R768 Other specified abnormal immunological findings in serum: Secondary | ICD-10-CM | POA: Insufficient documentation

## 2018-06-05 ENCOUNTER — Other Ambulatory Visit (INDEPENDENT_AMBULATORY_CARE_PROVIDER_SITE_OTHER): Payer: Self-pay | Admitting: Vascular Surgery

## 2018-06-05 ENCOUNTER — Ambulatory Visit (INDEPENDENT_AMBULATORY_CARE_PROVIDER_SITE_OTHER): Payer: Medicaid Other

## 2018-06-05 ENCOUNTER — Ambulatory Visit (INDEPENDENT_AMBULATORY_CARE_PROVIDER_SITE_OTHER): Payer: Medicaid Other | Admitting: Vascular Surgery

## 2018-06-05 ENCOUNTER — Other Ambulatory Visit: Payer: Self-pay

## 2018-06-05 DIAGNOSIS — I739 Peripheral vascular disease, unspecified: Secondary | ICD-10-CM

## 2018-06-05 DIAGNOSIS — Z95820 Peripheral vascular angioplasty status with implants and grafts: Secondary | ICD-10-CM

## 2018-06-05 DIAGNOSIS — I6523 Occlusion and stenosis of bilateral carotid arteries: Secondary | ICD-10-CM | POA: Diagnosis not present

## 2018-06-05 NOTE — Progress Notes (Signed)
Patient left before scheduled visit time.  Letter will be mailed with results.

## 2018-06-08 ENCOUNTER — Encounter (INDEPENDENT_AMBULATORY_CARE_PROVIDER_SITE_OTHER): Payer: Self-pay | Admitting: Vascular Surgery

## 2018-11-23 DIAGNOSIS — M059 Rheumatoid arthritis with rheumatoid factor, unspecified: Secondary | ICD-10-CM | POA: Insufficient documentation

## 2018-11-23 DIAGNOSIS — Z79899 Other long term (current) drug therapy: Secondary | ICD-10-CM | POA: Insufficient documentation

## 2018-11-28 DIAGNOSIS — J432 Centrilobular emphysema: Secondary | ICD-10-CM | POA: Insufficient documentation

## 2018-12-10 DIAGNOSIS — M170 Bilateral primary osteoarthritis of knee: Secondary | ICD-10-CM | POA: Insufficient documentation

## 2019-04-16 ENCOUNTER — Ambulatory Visit: Payer: Medicaid Other

## 2019-04-16 ENCOUNTER — Other Ambulatory Visit: Payer: Self-pay

## 2019-04-16 ENCOUNTER — Encounter: Payer: Self-pay | Admitting: Emergency Medicine

## 2019-04-16 ENCOUNTER — Ambulatory Visit
Admit: 2019-04-16 | Discharge: 2019-04-16 | Disposition: A | Discharge status: Home / Self Care | Payer: Medicaid Other | Attending: Urgent Care | Admitting: Urgent Care

## 2019-04-16 ENCOUNTER — Ambulatory Visit
Admission: EM | Admit: 2019-04-16 | Discharge: 2019-04-16 | Disposition: A | Discharge status: Home / Self Care | Payer: Medicaid Other | Attending: Urgent Care | Admitting: Urgent Care

## 2019-04-16 DIAGNOSIS — M25461 Effusion, right knee: Secondary | ICD-10-CM | POA: Diagnosis not present

## 2019-04-16 DIAGNOSIS — M7989 Other specified soft tissue disorders: Secondary | ICD-10-CM

## 2019-04-16 DIAGNOSIS — T148XXA Other injury of unspecified body region, initial encounter: Secondary | ICD-10-CM | POA: Diagnosis present

## 2019-04-16 DIAGNOSIS — M79622 Pain in left upper arm: Secondary | ICD-10-CM | POA: Diagnosis present

## 2019-04-16 MED ORDER — HYDROCODONE-ACETAMINOPHEN 5-325 MG PO TABS: 1.0000 | ORAL_TABLET | Freq: Three times a day (TID) | ORAL | 0 refills | Status: DC | PRN

## 2019-04-16 NOTE — Discharge Instructions (Addendum)
It was very nice seeing you today in clinic. Thank you for entrusting me with your care.   Knee film reviewed a large effusion. Wear brace for comfort and stability. Rest, ice, and elevate. Limit weight bearing.   [place] was negative for blood clot. Rest, apply warm compresses, and elevate to help with symptoms.   Use pain medication as directed. Be careful as it can make you sleepy.   Make arrangements to follow up with orthopedics for re-evaluation and ongoing care. I have provided you the name and office contact information for an excellent local provider. If your symptoms/condition worsens, please seek follow up care either here or in the ER. Please remember, our [place] providers are "right here with you" when you need [place].   Again, it was my pleasure to take care of you today. Thank you for choosing our clinic. I hope that you start to feel better quickly.   Honor Loh, MSN, APRN, FNP-C, CEN Advanced Practice Provider [facility]

## 2019-04-16 NOTE — ED Triage Notes (Signed)
Pt c/o right knee pain. Started about a week ago. She states the knee felt like it was going to give out but she made it to the couch but she has not been able to put weight on it all the way since. The knee is swollen. She states the knee is tender to touch also. She states about 2 months ago she stopped taking the methotrexate injections for RA. She also has a a large bruise on her upper left arm that spread from her where she received her covid vaccine. She is taking warfarin.

## 2019-04-17 ENCOUNTER — Ambulatory Visit: Payer: Medicaid Other | Admitting: Podiatry

## 2019-04-18 NOTE — ED Provider Notes (Signed)
[place], [place]   Name: Theresa Phelps DOB: [birth year] MRN: [id number] CSN: [id number] PCP: Patient, No Pcp Per  Arrival date and time:  04/16/19 1444  Chief Complaint:  Knee Pain and Arm Swelling  NOTE: Prior to seeing the patient today, I have reviewed the triage nursing documentation and vital signs. Clinical staff has updated patient's PMH/PSHx, current medication list, and drug allergies/intolerances to ensure comprehensive history available to assist in medical decision making.   History:   HPI: Theresa Phelps is a 59 y.o. female who presents today with complaints of pain in her RIGHT knee and LEFT upper arm as follows.  KNEE PAIN Patient with acute RIGHT knee pain x 1 week. She denies injury; no fall or other trauma. Patient reports that she feels as if her knee is going to "give out" when ambulating. Patient has appreciated a good amount of swelling to her knee that has improved somewhat since onset. Patient reporting that her knee is tender to touch, with no associated erythema or warmth. Pain is exacerbated by weight bearing, however she notes that her knee has been "hurting all of the time" for the last week. She denies pain in her calf; no claudication pain with dorsiflexion of the foot or with ambulation. Patient denies previous injuries or surgeries on her knee. PMH (+) for rheumatoid arthritis. Patient formally on MTX injections for her knee, however she reports today that she has not had an injection in over 2 months. In efforts to conservatively manage her symptoms at home, the patient notes that she has used APAP, which has not helped to improve her symptoms. Due to her pain, patient is reporting difficulties with ambulation and completion of her normal daily activities.   ARM PAIN Patient presents with significant pain and swelling to her LEFT upper extremity. Patient reports that she received her 2nd SARS-CoV-2 vaccine on [place]13/2019 at the mall in [place]. Patient  reports that her arm "just hurts so bad". She has FROM and intact distal sensation. Patient denies any weakness or distal paraesthesias. Of note, she is on warfarin for cardiac issues and PVD.    Past Medical History:  Diagnosis Date  . Acid reflux   . Arthritis    rheumatoid arthritis  . Asthma   . High cholesterol   . Peripheral vascular disease [facility])     Past Surgical History:  Procedure Laterality Date  . BREAST BIOPSY Left 2014  . LOWER EXTREMITY ANGIOGRAPHY Left 05/28/2015   Procedure: Lower Extremity Angiography;  Surgeon: Algernon Huxley, MD;  Location: [place] [facility];  Service: Cardiovascular;  Laterality: Left;  . PERIPHERAL VASCULAR CATHETERIZATION Right 07/16/2015   Procedure: Lower Extremity Angiography;  Surgeon: Algernon Huxley, MD;  Location: [place] [facility];  Service: Cardiovascular;  Laterality: Right;  . PERIPHERAL VASCULAR CATHETERIZATION  07/16/2015   Procedure: Lower Extremity Intervention;  Surgeon: Algernon Huxley, MD;  Location: [place] [facility];  Service: Cardiovascular;;  . RIGHT/LEFT HEART CATH AND CORONARY ANGIOGRAPHY Bilateral 11/07/2017   Procedure: RIGHT/LEFT HEART CATH AND CORONARY ANGIOGRAPHY;  Surgeon: Corey Skains, MD;  Location: [place] [facility];  Service: Cardiovascular;  Laterality: Bilateral;  . TEE WITHOUT CARDIOVERSION N/A 11/07/2017   Procedure: TRANSESOPHAGEAL ECHOCARDIOGRAM (TEE);  Surgeon: Corey Skains, MD;  Location: [facility];  Service: Cardiovascular;  Laterality: N/A;  . WISDOM TOOTH EXTRACTION      Family History  Problem Relation Age of Onset  . Hypertension Mother   . AAA (abdominal  aortic aneurysm) Father   . Other Father        bowel obstruction    Social History   Tobacco Use  . Smoking status: Former Smoker    Packs/day: 0.50    Years: 20.00    Pack years: 10.00    Types: Cigarettes    Quit date: 12/2018    Years since quitting: 0.3  . Smokeless tobacco: Never Used  Substance Use Topics  . Alcohol  use: Yes    Comment: twice week  . Drug use: No    Patient Active Problem List   Diagnosis Date Noted  . Mitral valve insufficiency 11/07/2017  . Bilateral carotid artery stenosis 06/01/2017  . Hyperlipidemia 06/01/2017  . Tobacco use disorder 08/23/2016  . Bilateral hand pain 08/23/2016  . PAD (peripheral artery disease) ([place]) 04/18/2016  . Lump or mass in breast 06/12/2012    Home Medications:    Current Meds  Medication Sig  . acetaminophen (TYLENOL) 500 MG tablet Take 2 tablets (1,000 mg total) by mouth every 6 (six) hours as needed.  Marland Kitchen albuterol (PROVENTIL HFA;VENTOLIN HFA) 108 (90 Base) MCG/ACT inhaler Inhale 1-2 puffs into the lungs every 6 (six) hours as needed for wheezing or shortness of breath. Use with spacer  . aspirin 81 MG EC tablet Take by mouth.  Marland Kitchen atorvastatin (LIPITOR) 10 MG tablet Take 10 mg by mouth daily.  Mar[place] Kitchen EPINEPHrine 0.3 mg/0.3 mL IJ SOAJ injection Inject into the muscle as directed.  . ezetimibe (ZETIA) 10 MG tablet Take by mouth.  . Fluticasone-Salmeterol (ADVAIR DISKUS) 100-50 MCG/DOSE AEPB Inhale 1 puff into the lungs 2 (two) times daily.  Marland Kitchen gabapentin (NEURONTIN) 100 MG capsule Take by mouth.  . warfarin (COUMADIN) 1 MG tablet Take 1 mg by mouth daily.  Marland Kitchen warfarin (COUMADIN) 6 MG tablet Take 6 mg by mouth at bedtime.    Allergies:   Shellfish allergy, Banana, Garlic, and Fish-derived products  Review of Systems (ROS):  Review of systems NEGATIVE unless otherwise noted in narrative H&P section.   Vital Signs: Today's Vitals   04/16/19 1509 04/16/19 1516 04/16/19 1749  BP:  111/76   Pulse:  (!) 59   Resp:  18   Temp:  98.1 F (36.7 C)   TempSrc:  Oral   SpO2:  99%   Weight: 179 lb 0.2 oz (81.2 kg)    Height: 5\' 7"  (1.702 m)    PainSc: 10-Worst pain ever  10-Worst pain ever    Physical Exam: Physical Exam  Constitutional: She is oriented to person, place, and time and well-developed, well-nourished, and in no distress.  HENT:  Head:  Normocephalic and atraumatic.  Eyes: Pupils are equal, round, and reactive to light.  Cardiovascular: Normal rate, regular rhythm, normal heart sounds and intact distal pulses.  Pulmonary/Chest: Effort normal and breath sounds normal.  Musculoskeletal:     Left upper arm: Swelling and tenderness present.     Right knee: Swelling and effusion present. No deformity, erythema or ecchymosis. Decreased range of motion. Tenderness present over the lateral joint line.     Comments: Extensive ecchymosis to LUE. See attached medical photographs.   Neurological: She is alert and oriented to person, place, and time. Gait normal.  Skin: Skin is warm and dry. No rash noted. She is not diaphoretic.  Psychiatric: Mood, memory, affect and judgment normal.  Nursing note and vitals reviewed.      Urgent Care Treatments / Results:   Orders Placed This Encounter  Procedures  .  DG Knee Complete 4 Views Right  . US Venous Img Upper Uni Left  . Apply knee brace/sleeve    LABS: PLEASE NOTE: all labs that were ordered this encounter are listed, however only abnormal results are displayed. Labs Reviewed - No data to display  EKG: -None  RADIOLOGY: US Venous Img Upper Uni Left  Result Date: 04/16/2019 CLINICAL DATA:  Left upper arm pain, swelling, and bruising after getting COVID vaccine 3 days ago. EXAM: LEFT UPPER EXTREMITY VENOUS DOPPLER ULTRASOUND TECHNIQUE: Darlis Wragg-scale sonography with graded compression, as well as color Doppler and duplex ultrasound were performed to evaluate the upper extremity deep venous system from the level of the subclavian vein and including the jugular, axillary, basilic, radial, ulnar and upper cephalic vein. Spectral Doppler was utilized to evaluate flow at rest and with distal augmentation maneuvers. COMPARISON:  None. FINDINGS: Contralateral Subclavian Vein: Respiratory phasicity is normal and symmetric with the symptomatic side. No evidence of thrombus. Normal  compressibility. Internal Jugular Vein: No evidence of thrombus. Normal compressibility, respiratory phasicity and response to augmentation. Subclavian Vein: No evidence of thrombus. Normal compressibility, respiratory phasicity and response to augmentation. Axillary Vein: No evidence of thrombus. Normal compressibility, respiratory phasicity and response to augmentation. Cephalic Vein: Not visualized due to soft tissue swelling. Basilic Vein: No evidence of thrombus. Normal compressibility, respiratory phasicity and response to augmentation. Brachial Veins: No evidence of thrombus. Normal compressibility, respiratory phasicity and response to augmentation. Radial Veins: No evidence of thrombus. Normal compressibility, respiratory phasicity and response to augmentation. Ulnar Veins: No evidence of thrombus. Normal compressibility, respiratory phasicity and response to augmentation. Venous Reflux:  None visualized. Other Findings:  None visualized. IMPRESSION: No evidence of DVT within the left upper extremity. Electronically Signed   By: Titus Dubin M.D.   On: 04/16/2019 17:27   DG Knee Complete 4 Views Right  Result Date: 04/16/2019 CLINICAL DATA:  Pain swelling and bruising after vaccine. Rheumatoid arthritis. EXAM: RIGHT KNEE - COMPLETE 4+ VIEW COMPARISON:  04/16/2017 FINDINGS: Advanced degenerative change in the patellofemoral joint. Medial and lateral joint space spurring without significant joint space narrowing. Large joint effusion. Calcific loose body in the suprapatellar bursa. Popliteal artery stent extending into the superficial femoral artery. IMPRESSION: Large joint effusion with large calcific loose body. Tricompartmental degenerative change without fracture Electronically Signed   By: Franchot Gallo M.D.   On: 04/16/2019 16:13    PROCEDURES: Procedures  MEDICATIONS RECEIVED THIS VISIT: Medications - No data to display  PERTINENT CLINICAL COURSE NOTES/UPDATES:   Initial Impression /  Assessment and Plan / Urgent Care Course:  Pertinent labs & imaging results that were available during my care of the patient were personally reviewed by me and considered in my medical decision making (see lab/imaging section of note for values and interpretations).  Theresa Phelps is a 59 y.o. female who presents to [facility] today with complaints of Knee Pain and Arm Swelling  Patient is well appearing overall in clinic today. She does not appear to be in any acute distress. Presenting symptoms (see HPI) and exam as documented above. Patient with multiple issues. Will address as follows:  ARM PAIN LEFT upper arm with expensive pain, swelling, and bruising following SARS-CoV-2 vaccine on Saturday. Patient is on warfarin daily. Doppler study performed and it was negative for DVT. Discussed conservative management with rest, ice, and elevation. Patient advised that any worsening will need to be re-evaluated by her PCP. Patient cannot take NSAIDs. APAP is not helping with her  pain. Will send in a short course of Norco 5/325 mg tablets for PRN use.   KNEE PAIN Pain has been worsening x 1 week. Patient is having painful ROM. There is an obvious effusion noted laterally. Diagnostic radiographs of the RIGHT knee revealed no acute abnormalities; no fracture or dislocation. Films revealed a large joint effusion with a large calcific loose body. Discussed will need to be seen by orthopedics for further evaluation and treatment. Name and office contact information provided on today's AVS for Dr. Hessie Knows Patient advised the she will need to contact the office to schedule an appointment to be seen. In the interim, will place patient in a knee brace/sleeve for support and comfort. Patient to limit weight bearing. She has crutches to use at home. She is ambulating with the assistance of a single crutch today. Recommend rest, ice, and elevated of the need to help with her symptoms. Patient may use the  prescribed Norco 5/325 mg tablets for pain.   I have reviewed the follow up and strict return precautions for any new or worsening symptoms. Patient is aware of symptoms that would be deemed urgent/emergent, and would thus require further evaluation either here or in the emergency department. At the time of discharge, she verbalized understanding and consent with the discharge plan as it was reviewed with her. All questions were fielded by provider and/or clinic staff prior to patient discharge.    Final Clinical Impressions / Urgent Care Diagnoses:   Final diagnoses:  Knee effusion, right  Left upper arm pain  Arm swelling  Bruising    New Prescriptions:  [place] Controlled Substance Registry consulted? Yes, I have consulted the [place] Controlled Substances Registry for this patient, and feel the risk/benefit ratio today is favorable for proceeding with this prescription for a controlled substance.  . Discussed use of controlled substance medication to treat her acute pain.  o Reviewed [place] STOP Act regulations  o Clinic does not refill controlled substances over the phone without face to face evaluation.  . Safety precautions reviewed.  o Medications should not be bitten, chewed, sold, or taken with alcohol.  o Avoid use while working, driving, or operating heavy machinery.  o Side effects associated with the use of this particular medication reviewed. - Patient understands that this medication can cause CNS depression, increase her risk of falls, and even lead to overdose that may result in death, if used outside of the parameters that she and I discussed.  With all of this in mind, she knowingly accepts the risks and responsibilities associated with intended course of treatment, and elects to responsibly proceed as discussed.  Meds ordered this encounter  Medications  . HYDROcodone-acetaminophen (NORCO) 5-325 MG tablet    Sig: Take 1 tablet by mouth 3 (three) times daily as needed for moderate  pain.    Dispense:  12 tablet    Refill:  0    Recommended Follow up Care:  Patient encouraged to follow up with the following provider within the specified time frame, or sooner as dictated by the severity of her symptoms. As always, she was instructed that for any urgent/emergent care needs, she should seek care either here or in the emergency department for more immediate evaluation.  Follow-up Information    Schedule an appointment as soon as possible for a visit  with Hessie Knows, MD.   Specialty: Orthopedic Surgery Contact information: [address] [facility] [place] [address] [phone]  NOTE: This note was prepared using Lobby[employer] along with smaller Company secretary. Despite my best ability to proofread, there is the potential that transcriptional errors may still occur from this process, and are completely unintentional.    Karen Kitchens, NP 04/18/19 1007

## 2019-05-27 ENCOUNTER — Encounter: Payer: Self-pay | Admitting: Podiatry

## 2019-05-27 ENCOUNTER — Other Ambulatory Visit: Payer: Self-pay | Admitting: Podiatry

## 2019-05-27 ENCOUNTER — Ambulatory Visit: Payer: Medicaid Other | Admitting: Podiatry

## 2019-05-27 ENCOUNTER — Ambulatory Visit (INDEPENDENT_AMBULATORY_CARE_PROVIDER_SITE_OTHER): Payer: Medicaid Other

## 2019-05-27 ENCOUNTER — Other Ambulatory Visit: Payer: Self-pay

## 2019-05-27 DIAGNOSIS — M2042 Other hammer toe(s) (acquired), left foot: Secondary | ICD-10-CM | POA: Diagnosis not present

## 2019-05-27 DIAGNOSIS — M2041 Other hammer toe(s) (acquired), right foot: Secondary | ICD-10-CM

## 2019-05-27 DIAGNOSIS — L603 Nail dystrophy: Secondary | ICD-10-CM

## 2019-05-27 DIAGNOSIS — I272 Pulmonary hypertension, unspecified: Secondary | ICD-10-CM | POA: Insufficient documentation

## 2019-05-27 DIAGNOSIS — S9031XA Contusion of right foot, initial encounter: Secondary | ICD-10-CM

## 2019-05-27 DIAGNOSIS — I6521 Occlusion and stenosis of right carotid artery: Secondary | ICD-10-CM | POA: Insufficient documentation

## 2019-05-27 NOTE — Progress Notes (Signed)
She presents today concerned about the thickness and possible fungus of her toes and feet.  She continues to have neuropathy type pains.  States that she has had to have stents placed in her thighs and she is also had open heart surgery since she saw [place] last in 17.  Objective: Vital signs are stable alert and oriented x3.  Pulses are palpable.  DP is nonpalpable.  Capillary fill time is immediate moderate severe hammertoe deformities rigid in nature with thick dystrophic nails.  Assessment: Pain in limb secondary to onychomycosis.  Plan: Samples of the toenails were taken today to be sent for pathologic evaluation we will follow-up with her in 1 month.

## 2019-06-07 ENCOUNTER — Encounter (INDEPENDENT_AMBULATORY_CARE_PROVIDER_SITE_OTHER): Payer: Medicaid Other

## 2019-06-07 ENCOUNTER — Ambulatory Visit (INDEPENDENT_AMBULATORY_CARE_PROVIDER_SITE_OTHER): Payer: Medicaid Other | Admitting: Vascular Surgery

## 2019-06-24 ENCOUNTER — Ambulatory Visit (INDEPENDENT_AMBULATORY_CARE_PROVIDER_SITE_OTHER): Payer: Medicaid Other | Admitting: Podiatry

## 2019-06-24 DIAGNOSIS — L603 Nail dystrophy: Secondary | ICD-10-CM

## 2019-06-24 NOTE — Progress Notes (Signed)
This encounter was created in error - please disregard.

## 2019-06-26 ENCOUNTER — Encounter: Payer: Self-pay | Admitting: Podiatry

## 2019-06-27 ENCOUNTER — Other Ambulatory Visit: Payer: Self-pay | Admitting: Internal Medicine

## 2019-06-27 ENCOUNTER — Other Ambulatory Visit: Payer: Self-pay | Admitting: Cardiology

## 2019-06-27 DIAGNOSIS — R0609 Other forms of dyspnea: Secondary | ICD-10-CM

## 2019-06-27 DIAGNOSIS — R079 Chest pain, unspecified: Secondary | ICD-10-CM

## 2019-06-27 DIAGNOSIS — Z951 Presence of aortocoronary bypass graft: Secondary | ICD-10-CM

## 2019-06-27 DIAGNOSIS — Z954 Presence of other heart-valve replacement: Secondary | ICD-10-CM

## 2019-06-27 DIAGNOSIS — R0602 Shortness of breath: Secondary | ICD-10-CM

## 2019-07-03 ENCOUNTER — Ambulatory Visit (INDEPENDENT_AMBULATORY_CARE_PROVIDER_SITE_OTHER): Payer: Medicaid Other | Admitting: Nurse Practitioner

## 2019-07-03 ENCOUNTER — Encounter (INDEPENDENT_AMBULATORY_CARE_PROVIDER_SITE_OTHER): Payer: Medicaid Other

## 2019-07-03 ENCOUNTER — Other Ambulatory Visit: Payer: Self-pay

## 2019-07-03 ENCOUNTER — Ambulatory Visit
Admission: RE | Admit: 2019-07-03 | Discharge: 2019-07-03 | Disposition: A | Discharge status: Home / Self Care | Payer: Medicaid Other | Source: Ambulatory Visit | Attending: Cardiology | Admitting: Cardiology

## 2019-07-03 DIAGNOSIS — R0600 Dyspnea, unspecified: Secondary | ICD-10-CM | POA: Diagnosis present

## 2019-07-03 DIAGNOSIS — R0609 Other forms of dyspnea: Secondary | ICD-10-CM

## 2019-07-03 DIAGNOSIS — Z951 Presence of aortocoronary bypass graft: Secondary | ICD-10-CM

## 2019-07-03 DIAGNOSIS — R079 Chest pain, unspecified: Secondary | ICD-10-CM | POA: Diagnosis present

## 2019-07-03 MED ORDER — TECHNETIUM TC 99M TETROFOSMIN IV KIT
30.0000 | PACK | Freq: Once | INTRAVENOUS | Status: AC | PRN
  Administered 2019-07-03: 31.139 via INTRAVENOUS

## 2019-07-03 MED ORDER — TECHNETIUM TC 99M TETROFOSMIN IV KIT
10.0000 | PACK | Freq: Once | INTRAVENOUS | Status: AC | PRN
  Administered 2019-07-03: 10.37 via INTRAVENOUS

## 2019-07-03 MED ORDER — REGADENOSON 0.4 MG/5ML IV SOLN
0.4000 mg | Freq: Once | INTRAVENOUS | Status: AC
  Administered 2019-07-03: 0.4 mg via INTRAVENOUS

## 2019-07-17 ENCOUNTER — Ambulatory Visit: Admission: RE | Admit: 2019-07-17 | Payer: Medicaid Other | Source: Ambulatory Visit

## 2019-07-19 LAB — NM MYOCAR MULTI W/SPECT W/WALL MOTION / EF
Estimated workload: 1 METS
Exercise duration (min): 1 min
Exercise duration (sec): 21 s
LV dias vol: 166 mL (ref 46–106)
LV sys vol: 79 mL
Peak HR: 71 {beats}/min
Percent HR: 44 %
Rest HR: 48 {beats}/min
SDS: 6
SRS: 30
SSS: 29
TID: 1.14

## 2019-07-26 ENCOUNTER — Ambulatory Visit (INDEPENDENT_AMBULATORY_CARE_PROVIDER_SITE_OTHER): Payer: Medicaid Other | Admitting: Nurse Practitioner

## 2019-07-26 ENCOUNTER — Encounter (INDEPENDENT_AMBULATORY_CARE_PROVIDER_SITE_OTHER): Payer: Medicaid Other

## 2019-07-26 ENCOUNTER — Telehealth: Payer: Self-pay

## 2019-07-26 NOTE — Telephone Encounter (Signed)
Patient notified of results.

## 2019-07-26 NOTE — Telephone Encounter (Signed)
-----   Message from Garrel Ridgel, [place] sent at 06/30/2019  9:15 PM EDT ----- Positive for yeast I will follow with her at her next scheduled appointment

## 2019-07-31 ENCOUNTER — Encounter: Payer: Self-pay | Admitting: Podiatry

## 2019-07-31 ENCOUNTER — Ambulatory Visit (INDEPENDENT_AMBULATORY_CARE_PROVIDER_SITE_OTHER): Payer: Medicaid Other | Admitting: Podiatry

## 2019-07-31 ENCOUNTER — Other Ambulatory Visit: Payer: Self-pay

## 2019-07-31 DIAGNOSIS — L603 Nail dystrophy: Secondary | ICD-10-CM

## 2019-07-31 MED ORDER — ITRACONAZOLE 100 MG PO CAPS: ORAL_CAPSULE | ORAL | 0 refills | Status: DC

## 2019-07-31 NOTE — Progress Notes (Signed)
She presents today for her pathology results.  Objective: Pathology results do demonstrate onychomycosis.  Assessment: Onychomycosis.  Plan: At this point were going to start her on itraconazole and we will follow-up with her in 3 month for blood work. She will take the itraconazole and a pulse dose regimen 2 tablets daily x1 week of each month.

## 2019-09-20 ENCOUNTER — Other Ambulatory Visit: Payer: Self-pay

## 2019-09-20 ENCOUNTER — Ambulatory Visit
Admission: EM | Admit: 2019-09-20 | Discharge: 2019-09-20 | Disposition: A | Discharge status: Home / Self Care | Payer: Medicaid Other

## 2019-09-20 ENCOUNTER — Encounter: Payer: Self-pay | Admitting: Emergency Medicine

## 2019-09-20 DIAGNOSIS — M25461 Effusion, right knee: Secondary | ICD-10-CM | POA: Diagnosis not present

## 2019-09-20 DIAGNOSIS — M069 Rheumatoid arthritis, unspecified: Secondary | ICD-10-CM

## 2019-09-20 DIAGNOSIS — M25561 Pain in right knee: Secondary | ICD-10-CM

## 2019-09-20 MED ORDER — OXYCODONE HCL 5 MG PO TABS: 5.0000 mg | ORAL_TABLET | Freq: Four times a day (QID) | ORAL | 0 refills | Status: AC | PRN

## 2019-09-20 NOTE — ED Provider Notes (Signed)
[facility]    CSN: [id number] Arrival date & time: 09/20/19  1349      History   Chief Complaint Chief Complaint  Patient presents with  . Knee Pain    HPI Theresa Phelps is a 59 y.o. female.   Patient presents for concerns about right knee pain and swelling.  This has been ongoing for the past 2 days.  She says that she woke up and it was swollen.  She denies any injury or falls.  She does have a history of chronic right knee pain due to tricompartmental arthritis as noted on knee x-ray 5 months ago through [facility] urgent care.  She also has a history of rheumatoid arthritis and peripheral vascular disease.  Patient takes Coumadin chronically for arrhythmia.  She has a blood drawn weekly to monitor her Coumadin and says recent values have been in normal range.  Patient has also had defibrillator placed last year.  Patient states that the swelling got so bad a few months ago she had to follow-up with orthopedics and they did a joint aspiration, which she says was mostly bloody fluid.  Patient says the knee feels little bit weak like she could fall and she does not have any brace.  Patient denies any numbness or tingling.  Admits to pain with weightbearing on the knee as well as with trying to straighten the knee completely or fully flex the knee.  She says she has been trying to ice it without relief.  Presents Tylenol is not helping her pain and she cannot take a lot of medications because of her heart problems.  Patient says that she saved 1 oxycodone from 1 year ago and took that which seemed to alleviate the pain for a little while.  Patient denies any fevers or erythema.  She does not have any other concerns today.  HPI  Past Medical History:  Diagnosis Date  . Acid reflux   . Arthritis    rheumatoid arthritis  . Asthma   . High cholesterol   . Peripheral vascular disease [facility])     Patient Active Problem List   Diagnosis Date Noted  . Carotid artery stenosis,  asymptomatic, right 05/27/2019  . Pulmonary hypertension ([place]) 05/27/2019  . Primary osteoarthritis of both knees 12/10/2018  . Centrilobular emphysema ([place]) 11/28/2018  . High risk medication use 11/23/2018  . Rheumatoid arthritis, seropositive ([place]) 11/23/2018  . Red blood cell antibody positive 01/19/2018  . S/P insertion of iliac artery stent 01/16/2018  . S/P mitral valve replacement with metallic valve 10/30/2534  . Warfarin anticoagulation 12/18/2017  . AV block, 1st degree 12/17/2017  . 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[place]) 12/15/2017  . 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 Coronary artery disease involving native coronary artery of native heart 11/10/2017  . Mitral valve insufficiency 11/07/2017  . Bilateral carotid artery stenosis 06/01/2017  . Hyperlipidemia 06/01/2017  . Prediabetes 05/31/2017  . Tobacco use disorder 08/23/2016  . Bilateral hand pain 08/23/2016  . PAD (peripheral artery disease) ([place]) 04/18/2016  . Lump or mass in breast 06/12/2012    Past Surgical History:  Procedure Laterality Date  . BREAST BIOPSY Left 2014  . LOWER EXTREMITY ANGIOGRAPHY Left 05/28/2015   Procedure: Lower Extremity Angiography;  Surgeon: Algernon Huxley, MD;  Location: [place] [facility];  Service: Cardiovascular;  Laterality: Left;  . PERIPHERAL VASCULAR CATHETERIZATION Right 07/16/2015   Procedure: Lower Extremity Angiography;  Surgeon: Algernon Huxley, MD;  Location: [place] [facility];  Service: Cardiovascular;  Laterality: Right;  . PERIPHERAL VASCULAR CATHETERIZATION  07/16/2015   Procedure: Lower Extremity Intervention;  Surgeon: Algernon Huxley, MD;  Location: [place] [facility];  Service: Cardiovascular;;  . RIGHT/LEFT HEART CATH AND CORONARY ANGIOGRAPHY Bilateral 11/07/2017   Procedure: RIGHT/LEFT HEART CATH AND CORONARY ANGIOGRAPHY;  Surgeon: Corey Skains, MD;  Location: [place] [facility];  Service: Cardiovascular;  Laterality: Bilateral;  . TEE WITHOUT CARDIOVERSION N/A 11/07/2017     Procedure: TRANSESOPHAGEAL ECHOCARDIOGRAM (TEE);  Surgeon: Corey Skains, MD;  Location: [facility];  Service: Cardiovascular;  Laterality: N/A;  . WISDOM TOOTH EXTRACTION      OB History    Gravida  2   Para  2   Term      Preterm      AB      Living  2     SAB      TAB      Ectopic      Multiple      Live Births           Obstetric Comments  1st Menstrual Cycle: 11 1st Pregnancy: 18         Home Medications    Prior to Admission medications   Medication Sig Start Date End Date Taking? Authorizing Provider  aspirin 81 MG EC tablet Take by mouth. 02/16/18  Yes [provider]  atorvastatin (LIPITOR) 10 MG tablet Take 10 mg by mouth daily.   Yes [provider]  famotidine (PEPCID) 20 MG tablet Take by mouth. 10/25/18 10/25/19 Yes [provider]  Fluticasone-Salmeterol (ADVAIR DISKUS) 100-50 MCG/DOSE AEPB Inhale 1 puff into the lungs 2 (two) times daily.   Yes [provider]  folic acid (FOLVITE) 1 MG tablet Take 1 mg by mouth daily. 12/10/18  Yes [provider]  furosemide (LASIX) 40 MG tablet furosemide 40 mg tablet   Yes [provider]  warfarin (COUMADIN) 1 MG tablet Take 1 mg by mouth daily. 02/05/19  Yes [provider]  warfarin (COUMADIN) 6 MG tablet Take 6 mg by mouth at bedtime. 02/05/19  Yes [provider]  acetaminophen (TYLENOL) 500 MG tablet Take 2 tablets (1,000 mg total) by mouth every 6 (six) hours as needed. 06/25/17   Betancourt, Aura Fey, NP  albuterol (PROVENTIL HFA;VENTOLIN HFA) 108 (90 Base) MCG/ACT inhaler Inhale 1-2 puffs into the lungs every 6 (six) hours as needed for wheezing or shortness of breath. Use with spacer 08/10/17   Lorin Picket, PA-C  amLODipine (NORVASC) 2.5 MG tablet Take 2.5 mg by mouth daily. 08/07/19   [provider]  atorvastatin (LIPITOR) 80 MG tablet Take 80 mg by mouth daily. 06/26/19   [provider]  EPINEPHrine 0.3 mg/0.3  mL IJ SOAJ injection Inject into the muscle as directed. 11/23/18   [provider]  ezetimibe (ZETIA) 10 MG tablet Take 10 mg by mouth daily. 06/26/19   [provider]  gabapentin (NEURONTIN) 100 MG capsule Take by mouth. 08/03/19   [provider]  itraconazole (SPORANOX) 100 MG capsule Take 1 tablet twice daily x 7 days, then hold for 3 weeks, then repeat cycle 07/31/19   Hyatt, Max T, DPM  oxyCODONE (ROXICODONE) 5 MG immediate release tablet Take 1 tablet (5 mg total) by mouth every 6 (six) hours as needed for up to 5 days for severe pain. 09/20/19 09/25/19  Danton Clap, PA-C  cetirizine (ZYRTEC) 10 MG tablet Take 10 mg by mouth  daily. Reported on 05/28/2015  04/16/19  [provider]  fluticasone (FLONASE) 50 MCG/ACT nasal spray Place 2 sprays into both nostrils daily. 08/10/17 04/16/19  Lorin Picket, PA-C    Family History Family History  Problem Relation Age of Onset  . Hypertension Mother   . AAA (abdominal aortic aneurysm) Father   . Other Father        bowel obstruction    Social History Social History   Tobacco Use  . Smoking status: Former Smoker    Packs/day: 0.50    Years: 20.00    Pack years: 10.00    Types: Cigarettes    Quit date: 12/2018    Years since quitting: 0.7  . Smokeless tobacco: Never Used  Vaping Use  . Vaping Use: Never used  Substance Use Topics  . Alcohol use: Yes    Comment: twice week  . Drug use: No     Allergies   Shellfish allergy, Banana, Garlic, and Fish-derived products   Review of Systems Review of Systems  Constitutional: Negative for chills, diaphoresis, fatigue and fever.  Gastrointestinal: Negative for nausea and vomiting.  Musculoskeletal: Positive for arthralgias, gait problem and joint swelling. Negative for myalgias.  Skin: Negative for color change, pallor, rash and wound.  Neurological: Positive for weakness (localized to knee). Negative for numbness.  Hematological: Bruises/bleeds  easily.     Physical Exam Triage Vital Signs ED Triage Vitals  Enc Vitals Group     BP 09/20/19 1402 122/69     Pulse Rate 09/20/19 1402 61     Resp 09/20/19 1402 14     Temp 09/20/19 1402 98.6 F (37 C)     Temp Source 09/20/19 1402 Oral     SpO2 09/20/19 1402 99 %     Weight 09/20/19 1358 179 lb 0.2 oz (81.2 kg)     Height 09/20/19 1358 5\' 7"  (1.702 m)     Head Circumference --      Peak Flow --      Pain Score 09/20/19 1358 10     Pain Loc --      Pain Edu? --      Excl. in [place]? --    No data found.  Updated Vital Signs BP 122/69 (BP Location: Left Arm)   Pulse 61   Temp 98.6 F (37 C) (Oral)   Resp 14   Ht 5\' 7"  (1.702 m)   Wt 179 lb 0.2 oz (81.2 kg)   SpO2 99%   BMI 28.04 kg/m      Physical Exam Vitals and nursing note reviewed.  Constitutional:      General: She is not in acute distress.    Appearance: Normal appearance. She is not ill-appearing or toxic-appearing.     Comments: She does appear to be in pain  HENT:     Head: Normocephalic and atraumatic.  Eyes:     General: No scleral icterus.       Right eye: No discharge.        Left eye: No discharge.     Conjunctiva/sclera: Conjunctivae normal.  Cardiovascular:     Rate and Rhythm: Normal rate and regular rhythm.     Heart sounds: Normal heart sounds.  Pulmonary:     Effort: Pulmonary effort is normal. No respiratory distress.     Breath sounds: Normal breath sounds.  Musculoskeletal:     Cervical back: Neck supple.     Left knee: Effusion (superior and lateral anterior knee) present.  No erythema or ecchymosis. Decreased range of motion (decreased flexion beyond 110 degrees, mildly reduced extension). Tenderness present over the medial joint line and lateral joint line.  Skin:    General: Skin is dry.     Findings: No bruising, erythema or lesion.  Neurological:     General: No focal deficit present.     Mental Status: She is alert. Mental status is at baseline.     Motor: No weakness.      Gait: Gait abnormal.  Psychiatric:        Mood and Affect: Mood normal.        Behavior: Behavior normal.        Thought Content: Thought content normal.      UC Treatments / Results  Labs (all labs ordered are listed, but only abnormal results are displayed) Labs Reviewed - No data to display  EKG   Radiology No results found.  Procedures Procedures (including critical care time)  Medications Ordered in UC Medications - No data to display  Initial Impression / Assessment and Plan / UC Course  I have reviewed the triage vital signs and the nursing notes.  Pertinent labs & imaging results that were available during my care of the patient were reviewed by me and considered in my medical decision making (see chart for details).   59 year old female with complicated medical history including rheumatoid arthritis, osteoarthritis, and chronic anticoagulant use.  Differentials for diagnosis today include flareup of osteoarthritis, flareup of rheumatoid arthritis, hemarthrosis due to anticoagulant use.  She does have a large effusion on exam today.  Range of motion is limited.  Patient given knee brace.  Patient advised that she may need to follow-up with Ortho for another joint aspiration.  She says she will do that.  Advised her to try to ice and elevate the knee.  She can take Tylenol and/or Roxicodone for pain as needed.  Follow-up with our clinic as needed.   Final Clinical Impressions(s) / UC Diagnoses   Final diagnoses:  Right knee pain, unspecified chronicity  Effusion, right knee  Rheumatoid arthritis involving right knee, unspecified whether rheumatoid factor present [facility])     Discharge Instructions     You have a condition requiring you to follow up with Orthopedics so please call one of the following office for appointment:   [facility] [address] [place], [place] [address] Phone: [phone]  [facility] [address], [place],   [address] Phone: [phone]     ED Prescriptions    Medication Sig Dispense Auth. Provider   oxyCODONE (ROXICODONE) 5 MG immediate release tablet Take 1 tablet (5 mg total) by mouth every 6 (six) hours as needed for up to 5 days for severe pain. 12 tablet Danton Clap, PA-C     I have reviewed the PDMP during this encounter.   Danton Clap, PA-C 09/20/19 1437

## 2019-09-20 NOTE — ED Triage Notes (Signed)
Patient c/o right knee swelling and pain that started on Wed. Night.  Patient denies injury or fall.

## 2019-09-20 NOTE — Discharge Instructions (Addendum)
You have a condition requiring you to follow up with Orthopedics so please call one of the following office for appointment:   [facility] [address] [place], [place] [address] Phone: [phone]  [facility] [address] [place], [place] [address] Phone: [phone]

## 2019-11-06 ENCOUNTER — Ambulatory Visit: Payer: Medicaid Other | Admitting: Podiatry

## 2020-02-11 ENCOUNTER — Other Ambulatory Visit (INDEPENDENT_AMBULATORY_CARE_PROVIDER_SITE_OTHER): Payer: Self-pay | Admitting: Nurse Practitioner

## 2020-02-11 DIAGNOSIS — I739 Peripheral vascular disease, unspecified: Secondary | ICD-10-CM

## 2020-02-11 DIAGNOSIS — I6523 Occlusion and stenosis of bilateral carotid arteries: Secondary | ICD-10-CM

## 2020-02-12 ENCOUNTER — Encounter (INDEPENDENT_AMBULATORY_CARE_PROVIDER_SITE_OTHER): Payer: Self-pay | Admitting: Nurse Practitioner

## 2020-02-12 ENCOUNTER — Ambulatory Visit (INDEPENDENT_AMBULATORY_CARE_PROVIDER_SITE_OTHER): Payer: Medicaid Other

## 2020-02-12 ENCOUNTER — Ambulatory Visit (INDEPENDENT_AMBULATORY_CARE_PROVIDER_SITE_OTHER): Payer: Medicaid Other | Admitting: Nurse Practitioner

## 2020-02-12 ENCOUNTER — Other Ambulatory Visit: Payer: Self-pay

## 2020-02-12 VITALS — BP 152/74 | HR 62 | Ht 70.0 in | Wt 183.0 lb

## 2020-02-12 DIAGNOSIS — E785 Hyperlipidemia, unspecified: Secondary | ICD-10-CM | POA: Diagnosis not present

## 2020-02-12 DIAGNOSIS — I739 Peripheral vascular disease, unspecified: Secondary | ICD-10-CM

## 2020-02-12 DIAGNOSIS — I6523 Occlusion and stenosis of bilateral carotid arteries: Secondary | ICD-10-CM

## 2020-02-12 DIAGNOSIS — M170 Bilateral primary osteoarthritis of knee: Secondary | ICD-10-CM | POA: Diagnosis not present

## 2020-02-19 ENCOUNTER — Encounter (INDEPENDENT_AMBULATORY_CARE_PROVIDER_SITE_OTHER): Payer: Self-pay | Admitting: Nurse Practitioner

## 2020-02-19 NOTE — Progress Notes (Signed)
Subjective:    Patient ID: Theresa Phelps, female    DOB: 08-06-1960, 60 y.o.   MRN: [id number] Chief Complaint  Patient presents with  . Follow-up  . Carotid    1 year    The patient returns to the office for followup and review of the noninvasive studies. There have been no interval changes in lower extremity symptoms. No interval shortening of the patient's claudication distance or development of rest pain symptoms. No new ulcers or wounds have occurred since the last visit. The patient is also following up for her carotid artery stenosis as well.  There have been no significant changes to the patient's overall health care.  here is no history of migraine headaches. There is no history of seizures.  The patient denies amaurosis fugax or recent TIA symptoms. There are no recent neurological changes noted. The patient denies history of DVT, PE or superficial thrombophlebitis. The patient denies recent episodes of angina or shortness of breath.   ABI Rt=1.01 and Lt=1.01  (previous ABI's Rt=0.90 and Lt=0.83) Duplex ultrasound of the triphasic tibial artery waveforms with good toe waveforms bilaterally.   Carotid Duplex done today shows Duplex ultrasound shows 40 to 59% stenosis in the right ICA with 1 to 39% stenosis on the left ICA stenosis bilaterally. The bilateral vertebral arteries have antegrade flow with normal flow hemodynamics in the bilateral subclavian arteries.   Review of Systems  Musculoskeletal: Positive for arthralgias.  All other systems reviewed and are negative.      Objective:   Physical Exam Vitals reviewed.  HENT:     Head: Normocephalic.  Cardiovascular:     Rate and Rhythm: Normal rate.     Pulses:          Dorsalis pedis pulses are 1+ on the right side and 1+ on the left side.       Posterior tibial pulses are 1+ on the right side and 1+ on the left side.  Pulmonary:     Effort: Pulmonary effort is normal.  Skin:    General: Skin is warm and  dry.  Neurological:     Mental Status: She is alert and oriented to person, place, and time.  Psychiatric:        Mood and Affect: Mood normal.        Behavior: Behavior normal.        Thought Content: Thought content normal.        Judgment: Judgment normal.     BP (!) 152/74   Pulse 62   Ht 5\' 10"  (1.778 m)   Wt 183 lb (83 kg)   BMI 26.26 kg/m   Past Medical History:  Diagnosis Date  . Acid reflux   . Arthritis    rheumatoid arthritis  . Asthma   . High cholesterol   . Peripheral vascular disease ([place])     Social History   Socioeconomic History  . Marital status: Single    Spouse name: Not on file  . Number of children: Not on file  . Years of education: Not on file  . Highest education level: Not on file  Occupational History  . Not on file  Tobacco Use  . Smoking status: Former Smoker    Packs/day: 0.50    Years: 20.00    Pack years: 10.00    Types: Cigarettes    Quit date: 12/2018    Years since quitting: 1.2  . Smokeless tobacco: Never Used  Vaping Use  .  Vaping Use: Never used  Substance and Sexual Activity  . Alcohol use: Yes    Comment: twice week  . Drug use: No  . Sexual activity: Yes    Birth control/protection: Post-menopausal  Other Topics Concern  . Not on file  Social History Narrative  . Not on file   Social Determinants of Health   Financial Resource Strain: Not on file  Food Insecurity: Not on file  Transportation Needs: Not on file  Physical Activity: Not on file  Stress: Not on file  Social Connections: Not on file  Intimate Partner Violence: Not on file    Past Surgical History:  Procedure Laterality Date  . BREAST BIOPSY Left 2014  . LOWER EXTREMITY ANGIOGRAPHY Left 05/28/2015   Procedure: Lower Extremity Angiography;  Surgeon: Algernon Huxley, MD;  Location: [facility];  Service: Cardiovascular;  Laterality: Left;  . PERIPHERAL VASCULAR CATHETERIZATION Right 07/16/2015   Procedure: Lower Extremity Angiography;   Surgeon: Algernon Huxley, MD;  Location: [facility];  Service: Cardiovascular;  Laterality: Right;  . PERIPHERAL VASCULAR CATHETERIZATION  07/16/2015   Procedure: Lower Extremity Intervention;  Surgeon: Algernon Huxley, MD;  Location: [facility];  Service: Cardiovascular;;  . RIGHT/LEFT HEART CATH AND CORONARY ANGIOGRAPHY Bilateral 11/07/2017   Procedure: RIGHT/LEFT HEART CATH AND CORONARY ANGIOGRAPHY;  Surgeon: Corey Skains, MD;  Location: [facility];  Service: Cardiovascular;  Laterality: Bilateral;  . TEE WITHOUT CARDIOVERSION N/A 11/07/2017   Procedure: TRANSESOPHAGEAL ECHOCARDIOGRAM (TEE);  Surgeon: Corey Skains, MD;  Location: [facility];  Service: Cardiovascular;  Laterality: N/A;  . WISDOM TOOTH EXTRACTION      Family History  Problem Relation Age of Onset  . Hypertension Mother   . AAA (abdominal aortic aneurysm) Father   . Other Father        bowel obstruction    Allergies  Allergen Reactions  . Shellfish Allergy Anaphylaxis  . Banana     Other reaction(s): Other (See Comments) Burning in the mouth  . Garlic     Other reaction(s): Other (See Comments) Pt states arms freeze up and can't talk but no SOB  . Isosorbide Nitrate Itching    Blurred vision  . Fish-Derived Products Rash    CBC Latest Ref Rng & Units 09/08/2014 01/20/2013  WBC 3.6 - 11.0 K/uL 8.4 10.2  Hemoglobin 12.0 - 16.0 g/dL 14.5 14.9  Hematocrit 35.0 - 47.0 % 43.6 45.1  Platelets 150 - 440 K/uL 240 248      CMP     Component Value Date/Time   NA 136 09/26/2014 1745   NA 138 01/20/2013 2320   K 3.7 09/26/2014 1745   K 4.1 01/20/2013 2320   CL 105 09/26/2014 1745   CL 108 (H) 01/20/2013 2320   CO2 23 09/26/2014 1745   CO2 20 (L) 01/20/2013 2320   GLUCOSE 109 (H) 09/26/2014 1745   GLUCOSE 165 (H) 01/20/2013 2320   BUN 6 07/15/2015 1247   BUN 17 01/20/2013 2320   CREATININE 0.88 07/15/2015 1247   CREATININE 1.14 01/20/2013 2320   CALCIUM 8.8 (L) 09/26/2014 1745    CALCIUM 9.2 01/20/2013 2320   PROT 7.5 09/26/2014 1745   PROT 8.5 (H) 01/20/2013 2320   ALBUMIN 3.6 09/26/2014 1745   ALBUMIN 3.7 01/20/2013 2320   AST 21 09/26/2014 1745   AST 14 (L) 01/20/2013 2320   ALT 18 09/26/2014 1745   ALT 22 01/20/2013 2320   ALKPHOS 96 09/26/2014  1745   ALKPHOS 105 01/20/2013 2320   BILITOT 0.2 (L) 09/26/2014 1745   BILITOT 0.4 01/20/2013 2320   GFRNONAA >60 07/15/2015 1247   GFRNONAA 55 (L) 01/20/2013 2320   GFRAA >60 07/15/2015 1247   GFRAA >60 01/20/2013 2320     VAS [place] ABI WITH/WO TBI  Result Date: 02/14/2020 LOWER EXTREMITY DOPPLER STUDY Indications: Peripheral artery disease.  Comparison Study: 06/05/2018 Performing Technologist: Almira Coaster RVS  Examination Guidelines: A complete evaluation includes at minimum, Doppler waveform signals and systolic blood pressure reading at the level of bilateral brachial, anterior tibial, and posterior tibial arteries, when vessel segments are accessible. Bilateral testing is considered an integral part of a complete examination. Photoelectric Plethysmograph (PPG) waveforms and toe systolic pressure readings are included as required and additional duplex testing as needed. Limited examinations for reoccurring indications may be performed as noted.  ABI Findings: +---------+------------------+-----+---------+--------+ Right    Rt Pressure (mmHg)IndexWaveform Comment  +---------+------------------+-----+---------+--------+ Brachial 155                                      +---------+------------------+-----+---------+--------+ ATA      146               0.92 triphasic         +---------+------------------+-----+---------+--------+ PTA      160               1.01 triphasic         +---------+------------------+-----+---------+--------+ Great Toe146               0.92 Normal            +---------+------------------+-----+---------+--------+ +---------+------------------+-----+---------+-------+  Left     Lt Pressure (mmHg)IndexWaveform Comment +---------+------------------+-----+---------+-------+ Brachial 158                                     +---------+------------------+-----+---------+-------+ ATA      157               0.99 triphasic        +---------+------------------+-----+---------+-------+ PTA      159               1.01 triphasic        +---------+------------------+-----+---------+-------+ Great Toe149               0.94 Normal           +---------+------------------+-----+---------+-------+ +-------+-----------+-----------+------------+------------+ ABI/TBIToday's ABIToday's TBIPrevious ABIPrevious TBI +-------+-----------+-----------+------------+------------+ Right  1.01       .92        .90         .53          +-------+-----------+-----------+------------+------------+ Left   1.01       .94        .83         .49          +-------+-----------+-----------+------------+------------+ Bilateral ABIs appear increased compared to prior study on 06/05/2018. Bilateral TBIs appear increased compared to prior study on 06/05/2018.  Summary: Right: Resting right ankle-brachial index is within normal range. No evidence of significant right lower extremity arterial disease. The right toe-brachial index is normal. Left: Resting left ankle-brachial index is within normal range. No evidence of significant left lower extremity arterial disease. The left toe-brachial index is normal.  *See table(s) above for measurements and observations.  Electronically signed by [name] MD on 02/14/2020 at 11:33:26 AM.    Final        Assessment & Plan:   1. Bilateral carotid artery stenosis Recommend:  Given the patient's asymptomatic subcritical stenosis no further invasive testing or surgery at this time.  Duplex ultrasound shows 40 to 59% stenosis in the right ICA with 1 to 39% stenosis on the left ICA stenosis bilaterally.  Continue antiplatelet therapy as  prescribed Continue management of CAD, HTN and Hyperlipidemia Healthy heart diet,  encouraged exercise at least 4 times per week Follow up in 12 months with duplex ultrasound and physical exam  - VAS US CAROTID; Future  2. PAD (peripheral artery disease) (HCC)  Recommend:  The patient has evidence of atherosclerosis of the lower extremities with claudication.  The patient does not voice lifestyle limiting changes at this point in time.  Noninvasive studies do not suggest clinically significant change.  No invasive studies, angiography or surgery at this time The patient should continue walking and begin a more formal exercise program.  The patient should continue antiplatelet therapy and aggressive treatment of the lipid abnormalities  No changes in the patient's medications at this time  The patient should continue wearing graduated compression socks 10-15 mmHg strength to control the mild edema.   - VAS [place] ABI WITH/WO TBI; Future  3. Hyperlipidemia, unspecified hyperlipidemia type Continue statin as ordered and reviewed, no changes at this time   4. Primary osteoarthritis of both knees Continue NSAID medications as already ordered, these medications have been reviewed and there are no changes at this time.  Continued activity and therapy was stressed.    Current Outpatient Medications on File Prior to Visit  Medication Sig Dispense Refill  . acetaminophen (TYLENOL) 500 MG tablet Take 2 tablets (1,000 mg total) by mouth every 6 (six) hours as needed. 30 tablet 0  . albuterol (PROVENTIL HFA;VENTOLIN HFA) 108 (90 Base) MCG/ACT inhaler Inhale 1-2 puffs into the lungs every 6 (six) hours as needed for wheezing or shortness of breath. Use with spacer 1 Inhaler 0  . amLODipine (NORVASC) 2.5 MG tablet Take 2.5 mg by mouth daily.    Marland Kitchen atorvastatin (LIPITOR) 10 MG tablet Take 10 mg by mouth daily.    Marland Kitchen atorvastatin (LIPITOR) 80 MG tablet Take 80 mg by mouth daily.    . clopidogrel  (PLAVIX) 75 MG tablet Take by mouth.    . EPINEPHrine 0.3 mg/0.3 mL IJ SOAJ injection Inject into the muscle as directed.    . ezetimibe (ZETIA) 10 MG tablet Take 10 mg by mouth daily.    . Fluticasone-Salmeterol (ADVAIR) 100-50 MCG/DOSE AEPB Inhale 1 puff into the lungs 2 (two) times daily.    . folic acid (FOLVITE) 1 MG tablet Take 1 mg by mouth daily.    . furosemide (LASIX) 40 MG tablet furosemide 40 mg tablet    . gabapentin (NEURONTIN) 100 MG capsule Take by mouth.    . itraconazole (SPORANOX) 100 MG capsule Take 1 tablet twice daily x 7 days, then hold for 3 weeks, then repeat cycle 42 capsule 0  . metoprolol tartrate (LOPRESSOR) 25 MG tablet Take 25 mg by mouth daily.    . nitroGLYCERIN (NITROSTAT) 0.4 MG SL tablet Place under the tongue.    . warfarin (COUMADIN) 1 MG tablet Take 1 mg by mouth daily.    Marland Kitchen warfarin (COUMADIN) 6 MG tablet Take 6 mg by mouth at bedtime.    Marland Kitchen aspirin 81 MG  EC tablet Take by mouth. (Patient not taking: Reported on 02/12/2020)    . [DISCONTINUED] cetirizine (ZYRTEC) 10 MG tablet Take 10 mg by mouth daily. Reported on 05/28/2015    . [DISCONTINUED] fluticasone (FLONASE) 50 MCG/ACT nasal spray Place 2 sprays into both nostrils daily. 16 g 0   No current facility-administered medications on file prior to visit.    There are no Patient Instructions on file for this visit. No follow-ups on file.   Kris Hartmann, NP

## 2020-04-13 ENCOUNTER — Telehealth: Payer: Self-pay | Admitting: *Deleted

## 2020-04-13 NOTE — Telephone Encounter (Signed)
I'm calling to find out when we scheduled your appointment.  We've been having problems with our computer.  "It's on the refrigerator downstairs.  I'll get my daughter to call you back and tell you."

## 2020-04-15 NOTE — Telephone Encounter (Signed)
I am calling to see when we are supposed to have you scheduled.  Our computer system has been skipping to different months.  "I'm supposed to be scheduled for 04/27/2020 at 1:30 pm.  I've already made arrangements for a ride."  Can you come in at 1:45 pm?  "Yes, that will be fine."

## 2020-04-27 ENCOUNTER — Ambulatory Visit: Payer: Medicaid Other | Admitting: Podiatry

## 2020-04-27 ENCOUNTER — Other Ambulatory Visit: Payer: Self-pay

## 2020-04-27 ENCOUNTER — Encounter: Payer: Self-pay | Admitting: Podiatry

## 2020-04-27 DIAGNOSIS — D2372 Other benign neoplasm of skin of left lower limb, including hip: Secondary | ICD-10-CM | POA: Diagnosis not present

## 2020-04-27 DIAGNOSIS — M79676 Pain in unspecified toe(s): Secondary | ICD-10-CM | POA: Diagnosis not present

## 2020-04-27 DIAGNOSIS — B351 Tinea unguium: Secondary | ICD-10-CM

## 2020-04-27 DIAGNOSIS — D2371 Other benign neoplasm of skin of right lower limb, including hip: Secondary | ICD-10-CM | POA: Diagnosis not present

## 2020-04-27 NOTE — Progress Notes (Signed)
She presents today chief complaint of painful toenails and calluses bilaterally.  Objective: Toenails are long thick yellow dystrophic-like mycotic.  Multiple reactive hyper keratomas.  Assessment: Pain in limb secondary to onychomycosis and benign skin lesions.  Plan: Debridement of benign skin lesions debridement of painful elongated toenails hammertoe deformities.

## 2020-08-03 ENCOUNTER — Ambulatory Visit: Payer: Medicaid Other | Admitting: Podiatry

## 2020-09-03 ENCOUNTER — Emergency Department: Payer: Medicaid Other

## 2020-09-03 ENCOUNTER — Inpatient Hospital Stay
Admission: EM | Admit: 2020-09-03 | Discharge: 2020-09-07 | DRG: 853 | Disposition: A | Discharge status: Home / Self Care | Payer: Medicaid Other | Attending: Internal Medicine | Admitting: Internal Medicine

## 2020-09-03 ENCOUNTER — Ambulatory Visit
Admission: EM | Admit: 2020-09-03 | Discharge: 2020-09-03 | Disposition: A | Discharge status: Other Acute Inpatient Hospital | Payer: Medicaid Other | Attending: Family Medicine | Admitting: Family Medicine

## 2020-09-03 ENCOUNTER — Encounter: Payer: Self-pay | Admitting: Emergency Medicine

## 2020-09-03 ENCOUNTER — Other Ambulatory Visit: Payer: Self-pay

## 2020-09-03 DIAGNOSIS — Z8249 Family history of ischemic heart disease and other diseases of the circulatory system: Secondary | ICD-10-CM

## 2020-09-03 DIAGNOSIS — R0602 Shortness of breath: Secondary | ICD-10-CM | POA: Diagnosis not present

## 2020-09-03 DIAGNOSIS — Z952 Presence of prosthetic heart valve: Secondary | ICD-10-CM

## 2020-09-03 DIAGNOSIS — Z87891 Personal history of nicotine dependence: Secondary | ICD-10-CM | POA: Diagnosis not present

## 2020-09-03 DIAGNOSIS — K625 Hemorrhage of anus and rectum: Secondary | ICD-10-CM

## 2020-09-03 DIAGNOSIS — I4891 Unspecified atrial fibrillation: Secondary | ICD-10-CM | POA: Diagnosis not present

## 2020-09-03 DIAGNOSIS — A419 Sepsis, unspecified organism: Secondary | ICD-10-CM | POA: Diagnosis present

## 2020-09-03 DIAGNOSIS — K219 Gastro-esophageal reflux disease without esophagitis: Secondary | ICD-10-CM | POA: Diagnosis present

## 2020-09-03 DIAGNOSIS — R739 Hyperglycemia, unspecified: Secondary | ICD-10-CM | POA: Diagnosis present

## 2020-09-03 DIAGNOSIS — K610 Anal abscess: Secondary | ICD-10-CM | POA: Diagnosis present

## 2020-09-03 DIAGNOSIS — I272 Pulmonary hypertension, unspecified: Secondary | ICD-10-CM | POA: Diagnosis present

## 2020-09-03 DIAGNOSIS — E669 Obesity, unspecified: Secondary | ICD-10-CM | POA: Diagnosis present

## 2020-09-03 DIAGNOSIS — Z91013 Allergy to seafood: Secondary | ICD-10-CM

## 2020-09-03 DIAGNOSIS — B9562 Methicillin resistant Staphylococcus aureus infection as the cause of diseases classified elsewhere: Secondary | ICD-10-CM | POA: Diagnosis present

## 2020-09-03 DIAGNOSIS — Z91018 Allergy to other foods: Secondary | ICD-10-CM

## 2020-09-03 DIAGNOSIS — R509 Fever, unspecified: Secondary | ICD-10-CM | POA: Diagnosis not present

## 2020-09-03 DIAGNOSIS — L0231 Cutaneous abscess of buttock: Secondary | ICD-10-CM | POA: Diagnosis present

## 2020-09-03 DIAGNOSIS — Z951 Presence of aortocoronary bypass graft: Secondary | ICD-10-CM | POA: Diagnosis not present

## 2020-09-03 DIAGNOSIS — R6521 Severe sepsis with septic shock: Secondary | ICD-10-CM | POA: Diagnosis present

## 2020-09-03 DIAGNOSIS — E876 Hypokalemia: Secondary | ICD-10-CM | POA: Diagnosis present

## 2020-09-03 DIAGNOSIS — E7800 Pure hypercholesterolemia, unspecified: Secondary | ICD-10-CM | POA: Diagnosis present

## 2020-09-03 DIAGNOSIS — L03317 Cellulitis of buttock: Secondary | ICD-10-CM | POA: Diagnosis present

## 2020-09-03 DIAGNOSIS — Z6829 Body mass index (BMI) 29.0-29.9, adult: Secondary | ICD-10-CM | POA: Diagnosis not present

## 2020-09-03 DIAGNOSIS — M069 Rheumatoid arthritis, unspecified: Secondary | ICD-10-CM | POA: Diagnosis present

## 2020-09-03 DIAGNOSIS — I739 Peripheral vascular disease, unspecified: Secondary | ICD-10-CM | POA: Diagnosis present

## 2020-09-03 DIAGNOSIS — Z20822 Contact with and (suspected) exposure to covid-19: Secondary | ICD-10-CM | POA: Diagnosis present

## 2020-09-03 DIAGNOSIS — N179 Acute kidney failure, unspecified: Secondary | ICD-10-CM | POA: Diagnosis present

## 2020-09-03 DIAGNOSIS — Z79899 Other long term (current) drug therapy: Secondary | ICD-10-CM

## 2020-09-03 DIAGNOSIS — I2510 Atherosclerotic heart disease of native coronary artery without angina pectoris: Secondary | ICD-10-CM | POA: Diagnosis present

## 2020-09-03 DIAGNOSIS — K612 Anorectal abscess: Secondary | ICD-10-CM

## 2020-09-03 DIAGNOSIS — Z7901 Long term (current) use of anticoagulants: Secondary | ICD-10-CM

## 2020-09-03 LAB — CBC
HCT: 36.6 % (ref 36.0–46.0)
Hemoglobin: 12.3 g/dL (ref 12.0–15.0)
MCH: 29.4 pg (ref 26.0–34.0)
MCHC: 33.6 g/dL (ref 30.0–36.0)
MCV: 87.4 fL (ref 80.0–100.0)
Platelets: 209 10*3/uL (ref 150–400)
RBC: 4.19 MIL/uL (ref 3.87–5.11)
RDW: 12.7 % (ref 11.5–15.5)
WBC: 34 10*3/uL — ABNORMAL HIGH (ref 4.0–10.5)
nRBC: 0 % (ref 0.0–0.2)

## 2020-09-03 LAB — CREATININE, SERUM
Creatinine, Ser: 1.1 mg/dL — ABNORMAL HIGH (ref 0.44–1.00)
GFR, Estimated: 57 mL/min — ABNORMAL LOW (ref >60)

## 2020-09-03 LAB — CBC WITH DIFFERENTIAL/PLATELET
Abs Immature Granulocytes: 1.66 10*3/uL — ABNORMAL HIGH (ref 0.00–0.07)
Basophils Absolute: 0.1 10*3/uL (ref 0.0–0.1)
Basophils Relative: 0 %
Eosinophils Absolute: 0 10*3/uL (ref 0.0–0.5)
Eosinophils Relative: 0 %
HCT: 37.6 % (ref 36.0–46.0)
Hemoglobin: 12.9 g/dL (ref 12.0–15.0)
Immature Granulocytes: 6 %
Lymphocytes Relative: 4 %
Lymphs Abs: 1 10*3/uL (ref 0.7–4.0)
MCH: 29.3 pg (ref 26.0–34.0)
MCHC: 34.3 g/dL (ref 30.0–36.0)
MCV: 85.5 fL (ref 80.0–100.0)
Monocytes Absolute: 2.3 10*3/uL — ABNORMAL HIGH (ref 0.1–1.0)
Monocytes Relative: 8 %
Neutro Abs: 22.4 10*3/uL — ABNORMAL HIGH (ref 1.7–7.7)
Neutrophils Relative %: 82 %
Platelets: 222 10*3/uL (ref 150–400)
RBC: 4.4 MIL/uL (ref 3.87–5.11)
RDW: 12.7 % (ref 11.5–15.5)
Smear Review: NORMAL
WBC: 27.3 10*3/uL — ABNORMAL HIGH (ref 4.0–10.5)
nRBC: 0 % (ref 0.0–0.2)

## 2020-09-03 LAB — URINALYSIS, COMPLETE (UACMP) WITH MICROSCOPIC
Bilirubin Urine: NEGATIVE
Glucose, UA: NEGATIVE mg/dL
Ketones, ur: NEGATIVE mg/dL
Nitrite: NEGATIVE
Protein, ur: NEGATIVE mg/dL
RBC / HPF: >50 RBC/hpf — ABNORMAL HIGH (ref 0–5)
Specific Gravity, Urine: 1.027 (ref 1.005–1.030)
pH: 5 (ref 5.0–8.0)

## 2020-09-03 LAB — COMPREHENSIVE METABOLIC PANEL
ALT: 15 U/L (ref 0–44)
AST: 22 U/L (ref 15–41)
Albumin: 2.9 g/dL — ABNORMAL LOW (ref 3.5–5.0)
Alkaline Phosphatase: 75 U/L (ref 38–126)
Anion gap: 8 (ref 5–15)
BUN: 22 mg/dL (ref 8–23)
CO2: 21 mmol/L — ABNORMAL LOW (ref 22–32)
Calcium: 7.8 mg/dL — ABNORMAL LOW (ref 8.9–10.3)
Chloride: 97 mmol/L — ABNORMAL LOW (ref 98–111)
Creatinine, Ser: 1.34 mg/dL — ABNORMAL HIGH (ref 0.44–1.00)
GFR, Estimated: 45 mL/min — ABNORMAL LOW (ref >60)
Glucose, Bld: 208 mg/dL — ABNORMAL HIGH (ref 70–99)
Potassium: 3.3 mmol/L — ABNORMAL LOW (ref 3.5–5.1)
Sodium: 126 mmol/L — ABNORMAL LOW (ref 135–145)
Total Bilirubin: 1.1 mg/dL (ref 0.3–1.2)
Total Protein: 6.8 g/dL (ref 6.5–8.1)

## 2020-09-03 LAB — RESP PANEL BY RT-PCR (FLU A&B, COVID) ARPGX2
Influenza A by PCR: NEGATIVE
Influenza B by PCR: NEGATIVE
SARS Coronavirus 2 by RT PCR: NEGATIVE

## 2020-09-03 LAB — APTT: aPTT: 60 seconds — ABNORMAL HIGH (ref 24–36)

## 2020-09-03 LAB — PROCALCITONIN: Procalcitonin: 14.08 ng/mL

## 2020-09-03 LAB — PROTIME-INR
INR: 5.1 (ref 0.8–1.2)
Prothrombin Time: 47.5 seconds — ABNORMAL HIGH (ref 11.4–15.2)

## 2020-09-03 LAB — LACTIC ACID, PLASMA
Lactic Acid, Venous: 1.2 mmol/L (ref 0.5–1.9)
Lactic Acid, Venous: 1.5 mmol/L (ref 0.5–1.9)
Lactic Acid, Venous: 1.6 mmol/L (ref 0.5–1.9)

## 2020-09-03 LAB — HEMOGLOBIN A1C
Hgb A1c MFr Bld: 6.1 % — ABNORMAL HIGH (ref 4.8–5.6)
Mean Plasma Glucose: 128.37 mg/dL

## 2020-09-03 MED ORDER — SODIUM CHLORIDE 0.9 % IV BOLUS (SEPSIS)
1000.0000 mL | Freq: Once | INTRAVENOUS | Status: AC
  Administered 2020-09-03: 1000 mL via INTRAVENOUS

## 2020-09-03 MED ORDER — SODIUM CHLORIDE 0.9 % IV SOLN
2.0000 g | Freq: Two times a day (BID) | INTRAVENOUS | Status: DC
  Administered 2020-09-04: 2 g via INTRAVENOUS
  Filled 2020-09-03 (×3): qty 2

## 2020-09-03 MED ORDER — ONDANSETRON HCL 4 MG/2ML IJ SOLN
4.0000 mg | Freq: Once | INTRAMUSCULAR | Status: AC
  Administered 2020-09-03: 4 mg via INTRAVENOUS
  Filled 2020-09-03: qty 2

## 2020-09-03 MED ORDER — MORPHINE SULFATE (PF) 4 MG/ML IV SOLN
4.0000 mg | Freq: Once | INTRAVENOUS | Status: AC
  Administered 2020-09-03: 4 mg via INTRAVENOUS
  Filled 2020-09-03: qty 1

## 2020-09-03 MED ORDER — FAMOTIDINE 20 MG IN NS 100 ML IVPB
20.0000 mg | Freq: Two times a day (BID) | INTRAVENOUS | Status: DC
  Administered 2020-09-03 – 2020-09-05 (×4): 20 mg via INTRAVENOUS
  Filled 2020-09-03 (×6): qty 100

## 2020-09-03 MED ORDER — LACTATED RINGERS IV SOLN: INTRAVENOUS | Status: DC

## 2020-09-03 MED ORDER — ACETAMINOPHEN 325 MG PO TABS
650.0000 mg | ORAL_TABLET | ORAL | Status: DC | PRN
  Administered 2020-09-03 – 2020-09-05 (×3): 650 mg via ORAL
  Filled 2020-09-03 (×3): qty 2

## 2020-09-03 MED ORDER — SODIUM CHLORIDE 0.9 % IV SOLN
2.0000 g | Freq: Once | INTRAVENOUS | Status: AC
  Administered 2020-09-03: 2 g via INTRAVENOUS
  Filled 2020-09-03: qty 2

## 2020-09-03 MED ORDER — METRONIDAZOLE 500 MG/100ML IV SOLN
500.0000 mg | Freq: Once | INTRAVENOUS | Status: AC
  Administered 2020-09-03: 500 mg via INTRAVENOUS
  Filled 2020-09-03: qty 100

## 2020-09-03 MED ORDER — VANCOMYCIN HCL 1250 MG/250ML IV SOLN
1250.0000 mg | INTRAVENOUS | Status: DC
  Filled 2020-09-03: qty 250

## 2020-09-03 MED ORDER — MIDODRINE HCL 5 MG PO TABS
10.0000 mg | ORAL_TABLET | Freq: Three times a day (TID) | ORAL | Status: DC
  Administered 2020-09-04 – 2020-09-05 (×4): 10 mg via ORAL
  Filled 2020-09-03 (×4): qty 2

## 2020-09-03 MED ORDER — SODIUM CHLORIDE 0.9 % IV SOLN
250.0000 mL | INTRAVENOUS | Status: DC | PRN
Start: 2020-09-03 — End: 2020-09-06

## 2020-09-03 MED ORDER — ONDANSETRON HCL 4 MG/2ML IJ SOLN
4.0000 mg | Freq: Four times a day (QID) | INTRAMUSCULAR | Status: DC | PRN
  Administered 2020-09-05: 4 mg via INTRAVENOUS
  Filled 2020-09-03: qty 2

## 2020-09-03 MED ORDER — SODIUM CHLORIDE 0.9 % IV SOLN: INTRAVENOUS | Status: DC

## 2020-09-03 MED ORDER — SODIUM CHLORIDE 0.9% FLUSH: 3.0000 mL | INTRAVENOUS | Status: DC | PRN

## 2020-09-03 MED ORDER — SODIUM CHLORIDE 0.9 % IV BOLUS
1000.0000 mL | Freq: Once | INTRAVENOUS | Status: AC
  Administered 2020-09-03: 1000 mL via INTRAVENOUS

## 2020-09-03 MED ORDER — NOREPINEPHRINE 4 MG/250ML-% IV SOLN
0.0000 ug/min | INTRAVENOUS | Status: DC
  Administered 2020-09-03: 6 ug/min via INTRAVENOUS
  Administered 2020-09-03: 2 ug/min via INTRAVENOUS
  Filled 2020-09-03: qty 250

## 2020-09-03 MED ORDER — DOCUSATE SODIUM 100 MG PO CAPS: 100.0000 mg | ORAL_CAPSULE | Freq: Two times a day (BID) | ORAL | Status: DC | PRN

## 2020-09-03 MED ORDER — SODIUM CHLORIDE 0.9% FLUSH
3.0000 mL | Freq: Two times a day (BID) | INTRAVENOUS | Status: DC
Start: 2020-09-03 — End: 2020-09-06
  Administered 2020-09-03 – 2020-09-06 (×5): 3 mL via INTRAVENOUS

## 2020-09-03 MED ORDER — POTASSIUM CHLORIDE 10 MEQ/100ML IV SOLN
10.0000 meq | INTRAVENOUS | Status: AC
  Administered 2020-09-03 (×2): 10 meq via INTRAVENOUS
  Filled 2020-09-03 (×2): qty 100

## 2020-09-03 MED ORDER — ACETAMINOPHEN 325 MG PO TABS
650.0000 mg | ORAL_TABLET | Freq: Once | ORAL | Status: AC
  Administered 2020-09-03: 650 mg via ORAL

## 2020-09-03 MED ORDER — VANCOMYCIN HCL 750 MG/150ML IV SOLN
750.0000 mg | Freq: Once | INTRAVENOUS | Status: AC
  Administered 2020-09-03: 750 mg via INTRAVENOUS
  Filled 2020-09-03 (×2): qty 150

## 2020-09-03 MED ORDER — IOHEXOL 350 MG/ML SOLN
80.0000 mL | Freq: Once | INTRAVENOUS | Status: AC | PRN
  Administered 2020-09-03: 80 mL via INTRAVENOUS

## 2020-09-03 MED ORDER — POLYETHYLENE GLYCOL 3350 17 G PO PACK: 17.0000 g | PACK | Freq: Every day | ORAL | Status: DC | PRN

## 2020-09-03 MED ORDER — VANCOMYCIN HCL IN DEXTROSE 1-5 GM/200ML-% IV SOLN
1000.0000 mg | Freq: Once | INTRAVENOUS | Status: AC
  Administered 2020-09-03: 1000 mg via INTRAVENOUS
  Filled 2020-09-03: qty 200

## 2020-09-03 NOTE — ED Triage Notes (Signed)
Pt presents today with c/o of "hemorrhoids" with bright red blood x 2 days. She c/o of foot pain (bilateral) x 2. Denies injury to feet.   She also appears to be SOB, RR 30 bpm, 02 sat 100%. Febrile 103.2 Denies chest pain.  She was on 8/30 by Cardiologist.   She reports being dropped off by son and will call for transport home.

## 2020-09-03 NOTE — ED Notes (Signed)
ED provider remains at bedside, aware of current vitals - see charted vitals. Plan is to reassess blood pressure once currently infusing liter infuses and then assess need for vasopressor.

## 2020-09-03 NOTE — Consult Note (Signed)
Pharmacy Antibiotic Note  Theresa Phelps is a 60 y.o. female admitted on 09/03/2020 with left gluteal cellulitis. Pharmacy has been consulted for vancomycin and cefepime dosing.  Plan: Initiate cefepime 2 gram Q12H based on renal function Vancomycin 1000 mg x1 given in ED, will order 750 mg x 1 for total LD of 1750 mg  Initiate vancomycin 1250 mg Q24H. Goal AUC 400-550 Estimated AUC 460/Cmin 11.6 Scr 1.34, IBW, Vd 0.72 Monitor renal function for dose adjustments Follow up cultures   Height: '5\' 10"'$  (177.8 cm) Weight: 85.7 kg (189 lb) IBW/kg (Calculated) : 68.5  Temp (24hrs), Avg:101.3 F (38.5 C), Min:99.3 F (37.4 C), Max:103.2 F (39.6 C)  Recent Labs  Lab 09/03/20 1400 09/03/20 1410 09/03/20 1643  WBC 27.3*  --   --   CREATININE 1.34*  --   --   LATICACIDVEN  --  1.2 1.5    Estimated Creatinine Clearance: 52.5 mL/min (A) (by C-G formula based on SCr of 1.34 mg/dL (H)).    Allergies  Allergen Reactions   Shellfish Allergy Anaphylaxis   Banana     Other reaction(s): Other (See Comments) Burning in the mouth   Garlic     Other reaction(s): Other (See Comments) Pt states arms freeze up and can't talk but no SOB   Isosorbide Nitrate Itching    Blurred vision   Fish-Derived Products Rash    Antimicrobials this admission: 9/1 cefepime >>  9/1 vancomycin >>  9/1 metronidazole x1   Dose adjustments this admission: N/a  Microbiology results: 9/1 BCx: sent 9/1 UCx: sent    Thank you for allowing pharmacy to be a part of this patient's care.  Dorothe Pea, PharmD, BCPS Clinical Pharmacist   09/03/2020 7:21 PM

## 2020-09-03 NOTE — Sepsis Progress Note (Signed)
Notified provider of need to order repeat lactic acid. ° °

## 2020-09-03 NOTE — ED Notes (Signed)
Provider at bedside

## 2020-09-03 NOTE — ED Notes (Signed)
Notified daughter, April Henkels via telephone call of mom being transferred to local ER.

## 2020-09-03 NOTE — ED Provider Notes (Signed)
[facility] Emergency Department Provider Note  Time seen: 2:19 PM  I have reviewed the triage vital signs and the nursing notes.   HISTORY  Chief Complaint Code Sepsis   HPI Theresa Phelps is a 60 y.o. female with a past medical history of gastric reflux, asthma, hyperlipidemia, CAD, CABG, presents to the emergency department for rectal pain.  Patient went to urgent care earlier today for rectal pain which she thought was due to hemorrhoid.  On examination patient found to have large left gluteal cellulitis/abscess with blood and pus present on rectal examination consistent with rectal/anal abscess with gluteal spread.  Patient was febrile to 103, transferred to the emergency department via EMS for further work-up and treatment.  Here the patient states 2 days of rectal pain.  No history of abscess previously.  No history of GI bleeding.  Patient appears to be on Coumadin.   Past Medical History:  Diagnosis Date   Acid reflux    Arthritis    rheumatoid arthritis   Asthma    High cholesterol    Peripheral vascular disease ([place])     Patient Active Problem List   Diagnosis Date Noted   Carotid artery stenosis, asymptomatic, right 05/27/2019   Pulmonary hypertension ([place]) 05/27/2019   Primary osteoarthritis of both knees 12/10/2018   Centrilobular emphysema ([place]) 11/28/2018   High risk medication use 11/23/2018   Rheumatoid arthritis, seropositive ([place]) 11/23/2018   Red blood cell antibody positive 01/19/2018   S/P insertion of iliac artery stent 01/16/2018   S/P mitral valve replacement with metallic valve [id number]   Warfarin anticoagulation 12/18/2017   AV block, 1st degree 12/17/2017   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[place]) 12/15/2017   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/14/2017   Coronary artery disease involving native coronary artery of native heart 11/10/2017   Mitral valve insufficiency 11/07/2017   Bilateral carotid artery stenosis 06/01/2017    Hyperlipidemia 06/01/2017   Prediabetes 05/31/2017   Tobacco use disorder 08/23/2016   Bilateral hand pain 08/23/2016   PAD (peripheral artery disease) ([place]) 04/18/2016   Lump or mass in breast 06/12/2012    Past Surgical History:  Procedure Laterality Date   BREAST BIOPSY Left 2014   LOWER EXTREMITY ANGIOGRAPHY Left 05/28/2015   Procedure: Lower Extremity Angiography;  Surgeon: Algernon Huxley, MD;  Location: [place] [facility];  Service: Cardiovascular;  Laterality: Left;   PERIPHERAL VASCULAR CATHETERIZATION Right 07/16/2015   Procedure: Lower Extremity Angiography;  Surgeon: Algernon Huxley, MD;  Location: [place] [facility];  Service: Cardiovascular;  Laterality: Right;   PERIPHERAL VASCULAR CATHETERIZATION  07/16/2015   Procedure: Lower Extremity Intervention;  Surgeon: Algernon Huxley, MD;  Location: [place] [facility];  Service: Cardiovascular;;   RIGHT/LEFT HEART CATH AND CORONARY ANGIOGRAPHY Bilateral 11/07/2017   Procedure: RIGHT/LEFT HEART CATH AND CORONARY ANGIOGRAPHY;  Surgeon: Corey Skains, MD;  Location: [place] [facility];  Service: Cardiovascular;  Laterality: Bilateral;   TEE WITHOUT CARDIOVERSION N/A 11/07/2017   Procedure: TRANSESOPHAGEAL ECHOCARDIOGRAM (TEE);  Surgeon: Corey Skains, MD;  Location: [facility];  Service: Cardiovascular;  Laterality: N/A;   WISDOM TOOTH EXTRACTION      Prior to Admission medications   Medication Sig Start Date End Date Taking? Authorizing Provider  acetaminophen (TYLENOL) 500 MG tablet Take 2 tablets (1,000 mg total) by mouth every 6 (six) hours as needed. 06/25/17   Betancourt, Aura Fey, NP  amLODipine (NORVASC) 2.5 MG tablet Take 2.5 mg by mouth daily. 08/07/19  [provider]  atorvastatin (LIPITOR) 10 MG tablet Take 10 mg by mouth daily.    [provider]  atorvastatin (LIPITOR) 80 MG tablet Take 80 mg by mouth daily. 06/26/19   [provider]  EPINEPHrine 0.3 mg/0.3 mL IJ SOAJ injection Inject into the  muscle as directed. 11/23/18   [provider]  ezetimibe (ZETIA) 10 MG tablet Take 10 mg by mouth daily. 06/26/19   [provider]  Fluticasone-Salmeterol (ADVAIR) 100-50 MCG/DOSE AEPB Inhale 1 puff into the lungs 2 (two) times daily.    [provider]  folic acid (FOLVITE) 1 MG tablet Take 1 mg by mouth daily. 12/10/18   [provider]  furosemide (LASIX) 40 MG tablet furosemide 40 mg tablet    [provider]  gabapentin (NEURONTIN) 100 MG capsule Take by mouth. 08/03/19   [provider]  isosorbide mononitrate (IMDUR) 30 MG 24 hr tablet isosorbide mononitrate ER 30 mg tablet,extended release 24 hr    [provider]  metoprolol tartrate (LOPRESSOR) 25 MG tablet Take 25 mg by mouth daily. 10/20/19   [provider]  nitroGLYCERIN (NITROSTAT) 0.4 MG SL tablet Place under the tongue. 07/22/19 07/21/20  [provider]  oxyCODONE (OXY IR/ROXICODONE) 5 MG immediate release tablet oxycodone 5 mg tablet  TAKE 1 TABLET BY MOUTH EVERY 6 HOURS AS NEEDED FOR UP TO 5 DAYS FOR SEVERE PAIN    [provider]  warfarin (COUMADIN) 1 MG tablet Take 1 mg by mouth daily. 02/05/19   [provider]  warfarin (COUMADIN) 6 MG tablet Take 6 mg by mouth at bedtime. 02/05/19   [provider]  cetirizine (ZYRTEC) 10 MG tablet Take 10 mg by mouth daily. Reported on 05/28/2015  04/16/19  [provider]  fluticasone (FLONASE) 50 MCG/ACT nasal spray Place 2 sprays into both nostrils daily. 08/10/17 04/16/19  Lorin Picket, PA-C    Allergies  Allergen Reactions   Shellfish Allergy Anaphylaxis   Banana     Other reaction(s): Other (See Comments) Burning in the mouth   Garlic     Other reaction(s): Other (See Comments) Pt states arms freeze up and can't talk but no SOB   Isosorbide Nitrate Itching    Blurred vision   Fish-Derived Products Rash    Family History  Problem Relation Age of Onset    Hypertension Mother    AAA (abdominal aortic aneurysm) Father    Other Father        bowel obstruction    Social History Social History   Tobacco Use   Smoking status: Former    Packs/day: 0.50    Years: 20.00    Pack years: 10.00    Types: Cigarettes    Quit date: 12/2018    Years since quitting: 1.7   Smokeless tobacco: Never  Vaping Use   Vaping Use: Never used  Substance Use Topics   Alcohol use: Yes    Comment: twice week   Drug use: No    Review of Systems Constitutional: Positive for fever Cardiovascular: Negative for chest pain. Respiratory: Negative for shortness of breath. Gastrointestinal: Positive for rectal pain. Genitourinary: Negative for urinary compaints Musculoskeletal: Does state right leg pain x1 or 2 days as well.  Unknown cause. Neurological: Negative for headache All other ROS negative  ____________________________________________   PHYSICAL EXAM:  Constitutional: Alert and oriented. Well appearing and in no distress. Eyes: Normal exam ENT      Head: Normocephalic and atraumatic.  Mouth/Throat: Mucous membranes are moist. Cardiovascular: Normal rate, regular rhythm.  Respiratory: Normal respiratory effort without tachypnea nor retractions. Breath sounds are clear  Gastrointestinal: Soft and nontender. No distention.  On rectal examination patient has tender indurated area to the left gluteal region with some dried blood around the anus from her prior rectal examination. Musculoskeletal: Some tenderness to the right lower extremity but neurovascularly intact, warm to the touch.  No signs of obvious trauma. Neurologic:  Normal speech and language. No gross focal neurologic deficits  Skin:  Skin is warm, dry and intact.  Psychiatric: Mood and affect are normal.   ____________________________________________    EKG  EKG viewed and interpreted by myself shows what appears to be a sinus rhythm at 87 bpm with a narrow QRS, normal axis,  normal intervals, inferolateral T wave inversions present.  ____________________________________________    RADIOLOGY  CT scan is pending  ____________________________________________   INITIAL IMPRESSION / ASSESSMENT AND PLAN / ED COURSE  Pertinent labs & imaging results that were available during my care of the patient were reviewed by me and considered in my medical decision making (see chart for details).   Patient presents emergency department for rectal pain found to have what appears to be a large rectal/perirectal abscess and cellulitis.  We will start on broad-spectrum antibiotics as the patient meets sepsis criteria.  We will check labs, cultures.  We will obtain CT imaging of the pelvis with contrast to further evaluate the depth and degree of abscess/infection.  Patient will require surgical consultation once CT has resulted in admission to the hospital.  YULINDA SCOVEL was evaluated in Emergency Department on 09/03/2020 for the symptoms described in the history of present illness. She was evaluated in the context of the global COVID-19 pandemic, which necessitated consideration that the patient might be at risk for infection with the SARS-CoV-2 virus that causes COVID-19. Institutional protocols and algorithms that pertain to the evaluation of patients at risk for COVID-19 are in a state of rapid change based on information released by regulatory bodies including the CDC and federal and state organizations. These policies and algorithms were followed during the patient's care in the ED.  CRITICAL CARE Performed by: Harvest Dark   Total critical care time: 30 minutes  Critical care time was exclusive of separately billable procedures and treating other patients.  Critical care was necessary to treat or prevent imminent or life-threatening deterioration.  Critical care was time spent personally by me on the following activities: development of treatment plan with patient  and/or surrogate as well as nursing, discussions with consultants, evaluation of patient's response to treatment, examination of patient, obtaining history from patient or surrogate, ordering and performing treatments and interventions, ordering and review of laboratory studies, ordering and review of radiographic studies, pulse oximetry and re-evaluation of patient's condition.  ____________________________________________   FINAL CLINICAL IMPRESSION(S) / ED DIAGNOSES  Perirectal abscess Sepsis   Harvest Dark, MD 09/03/20 1423

## 2020-09-03 NOTE — ED Notes (Addendum)
Attending provider paged by secretary.  New EKG obtained, showing pt is in an A Fib/A Flutter rhythm which is new. Pt denies chest pain or symptoms with this.

## 2020-09-03 NOTE — H&P (Signed)
NAME:  Theresa Phelps, MRN:  [id number], DOB:  05-Sep-1960, LOS: 0 ADMISSION DATE:  09/03/2020 CONSULTATION DATE:  09/03/2020  REFERRING MD:  Starleen Blue CHIEF COMPLAINT:  low BP   History of Present Illness:  60 y.o. female with a past medical history of gastric reflux, asthma, hyperlipidemia, CAD, CABG, presents to the emergency department for rectal pain.    On examination patient found to have large left gluteal cellulitis/abscess with blood and pus    Patient was febrile to 103, transferred to the emergency department via EMS for further work-up and treatment.    Here the patient states 2 days of rectal pain.  No history of abscess previously.  No history of GI bleeding.  Patient appears to be on Coumadin No history of DM   CT PELVIS does NOT show abscess +inflammation left gluteal region  Significant Hospital Events: Including procedures, antibiotic start and stop dates in addition to other pertinent events   9/1 CT pelvis 1. Perianal and left-sided perineal and gluteal inflammation consistent with cellulitis. No abscess identified. 2. Ectatic right common iliac artery with a suspected underlying short-segment dissection.  9/1 given 3 L IVF's placed on pressors     Micro Data:  COVID NEG BLOOD CULTURES PENDING  Antimicrobials:   Antibiotics Given (last 72 hours)     Date/Time Action Medication Dose Rate   09/03/20 1438 New Bag/Given   ceFEPIme (MAXIPIME) 2 g in sodium chloride 0.9 % 100 mL IVPB 2 g 200 mL/hr   09/03/20 1500 New Bag/Given   metroNIDAZOLE (FLAGYL) IVPB 500 mg 500 mg 100 mL/hr   09/03/20 1610 New Bag/Given  [broad spectrum antibiotics given first]   vancomycin (VANCOCIN) IVPB 1000 mg/200 mL premix 1,000 mg 200 mL/hr             Objective   Blood pressure (!) 87/34, pulse (!) 55, temperature 99.3 F (37.4 C), temperature source Oral, resp. rate 17, height '5\' 10"'$  (1.778 m), weight 85.7 kg, SpO2 96 %.        Intake/Output Summary (Last 24  hours) at 09/03/2020 1813 Last data filed at 09/03/2020 1742 Gross per 24 hour  Intake 3050 ml  Output --  Net 3050 ml   Filed Weights   09/03/20 1423  Weight: 85.7 kg     Review of Systems:  Gen:  Denies  fever, sweats, chills weight loss  HEENT: Denies blurred vision, double vision, ear pain, eye pain, hearing loss, nose bleeds, sore throat Cardiac:  No dizziness, chest pain or heaviness, chest tightness,edema, No JVD Resp:   No cough, -sputum production, -shortness of breath,-wheezing, -hemoptysis,  Gi: Denies swallowing difficulty, stomach pain, nausea or vomiting, diarrhea, constipation, bowel incontinence Gu:  Denies bladder incontinence, burning urine Ext:   left buttock pain Skin: Denies  skin rash, easy bruising or bleeding or hives Endoc:  Denies polyuria, polydipsia , polyphagia or weight change Psych:   Denies depression, insomnia or hallucinations  Other:  All other systems negative   PHYSICAL EXAMINATION:  GENERAL:ill appearing,  EYES: Pupils equal, round, reactive to light.  No scleral icterus.  MOUTH: Moist mucosal membrane.  NECK: Supple.  PULMONARY: +rhonchi, +wheezing CARDIOVASCULAR: S1 and S2.  No murmurs  GASTROINTESTINAL: Soft, nontender, -distended. Positive bowel sounds.  MUSCULOSKELETAL: tender indurated area to the left gluteal region with some dried blood around the anus  NEUROLOGIC: obtunded SKIN:intact,warm,dry     Labs/imaging that I havepersonally reviewed  (right click and "Reselect all SmartList Selections" daily)  ASSESSMENT AND PLAN SYNOPSIS  60 yo obese AAM with acute sepsis present on admission with Left Gluteal Cellulitis Patient given 3L fluids and now starting on pressors  SEPTIC shock SOURCE-left gluteal cellulitis -use vasopressors to keep MAP>65 as needed -follow up cultures -emperic ABX -consider stress dose steroids -aggressive IV fluid Resuscitation Start [place] ICU monitoring   ACUTE  KIDNEY INJURY/Renal Failure -continue Foley Catheter-assess need -Avoid nephrotoxic agents -Follow urine output, BMP -Ensure adequate renal perfusion, optimize oxygenation -Renal dose medications   Intake/Output Summary (Last 24 hours) at 09/03/2020 1813 Last data filed at 09/03/2020 1742 Gross per 24 hour  Intake 3050 ml  Output --  Net 3050 ml   BMP Latest Ref Rng & Units 09/03/2020 07/15/2015 05/28/2015  Glucose 70 - 99 mg/dL 208(H) - -  BUN 8 - 23 mg/dL '22 6 11  '$ Creatinine 0.44 - 1.00 mg/dL 1.34(H) 0.88 1.03(H)  Sodium 135 - 145 mmol/L 126(L) - -  Potassium 3.5 - 5.1 mmol/L 3.3(L) - -  Chloride 98 - 111 mmol/L 97(L) - -  CO2 22 - 32 mmol/L 21(L) - -  Calcium 8.9 - 10.3 mg/dL 7.8(L) - -     INFECTIOUS DISEASE -continue antibiotics as prescribed -follow up cultures  ENDO-NEW ONSET DM - ICU hypoglycemic\Hyperglycemia protocol -check FSBS per protocol   GI GI PROPHYLAXIS as indicated  NUTRITIONAL STATUS DIET-->as tolerated Constipation protocol as indicated   ELECTROLYTES -follow labs as needed -replace as needed -pharmacy consultation and following     Best practice (right click and "Reselect all SmartList Selections" daily)  DVT prophylaxis: Subcutaneous Heparin Mobility:  bed rest  Code Status:  FULL Disposition:ICU  Labs   CBC: Recent Labs  Lab 09/03/20 1400  WBC 27.3*  NEUTROABS 22.4*  HGB 12.9  HCT 37.6  MCV 85.5  PLT [id number]    Basic Metabolic Panel: Recent Labs  Lab 09/03/20 1400  NA 126*  K 3.3*  CL 97*  CO2 21*  GLUCOSE 208*  BUN 22  CREATININE 1.34*  CALCIUM 7.8*   GFR: Estimated Creatinine Clearance: 52.5 mL/min (A) (by C-G formula based on SCr of 1.34 mg/dL (H)). Recent Labs  Lab 09/03/20 1400 09/03/20 1410 09/03/20 1643  WBC 27.3*  --   --   LATICACIDVEN  --  1.2 1.5    Liver Function Tests: Recent Labs  Lab 09/03/20 1400  AST 22  ALT 15  ALKPHOS 75  BILITOT 1.1  PROT 6.8  ALBUMIN 2.9*   No results for  input(s): LIPASE, AMYLASE in the last 168 hours. No results for input(s): AMMONIA in the last 168 hours.  ABG No results found for: PHART, PCO2ART, PO2ART, HCO3, TCO2, ACIDBASEDEF, O2SAT   Coagulation Profile: Recent Labs  Lab 09/03/20 1400  INR 5.1*    Cardiac Enzymes: No results for input(s): CKTOTAL, CKMB, CKMBINDEX, TROPONINI in the last 168 hours.  HbA1C: No results found for: HGBA1C  CBG: No results for input(s): GLUCAP in the last 168 hours.   Past Medical History:  She,  has a past medical history of Acid reflux, Arthritis, Asthma, High cholesterol, and Peripheral vascular disease ([place]).   Surgical History:   Past Surgical History:  Procedure Laterality Date   BREAST BIOPSY Left 2014   LOWER EXTREMITY ANGIOGRAPHY Left 05/28/2015   Procedure: Lower Extremity Angiography;  Surgeon: Algernon Huxley, MD;  Location: [place] [facility];  Service: Cardiovascular;  Laterality: Left;   PERIPHERAL VASCULAR CATHETERIZATION Right 07/16/2015   Procedure: Lower Extremity Angiography;  Surgeon: Algernon Huxley, MD;  Location: [place] [facility];  Service: Cardiovascular;  Laterality: Right;   PERIPHERAL VASCULAR CATHETERIZATION  07/16/2015   Procedure: Lower Extremity Intervention;  Surgeon: Algernon Huxley, MD;  Location: [place] [facility];  Service: Cardiovascular;;   RIGHT/LEFT HEART CATH AND CORONARY ANGIOGRAPHY Bilateral 11/07/2017   Procedure: RIGHT/LEFT HEART CATH AND CORONARY ANGIOGRAPHY;  Surgeon: Corey Skains, MD;  Location: [place] [facility];  Service: Cardiovascular;  Laterality: Bilateral;   TEE WITHOUT CARDIOVERSION N/A 11/07/2017   Procedure: TRANSESOPHAGEAL ECHOCARDIOGRAM (TEE);  Surgeon: Corey Skains, MD;  Location: [facility];  Service: Cardiovascular;  Laterality: N/A;   WISDOM TOOTH EXTRACTION       Social History:   reports that she quit smoking about 21 months ago. Her smoking use included cigarettes. She has a 10.00 pack-year smoking history. She has  never used smokeless tobacco. She reports current alcohol use. She reports that she does not use drugs.   Family History:  Her family history includes AAA (abdominal aortic aneurysm) in her father; Hypertension in her mother; Other in her father.   Allergies Allergies  Allergen Reactions   Shellfish Allergy Anaphylaxis   Banana     Other reaction(s): Other (See Comments) Burning in the mouth   Garlic     Other reaction(s): Other (See Comments) Pt states arms freeze up and can't talk but no SOB   Isosorbide Nitrate Itching    Blurred vision   Fish-Derived Products Rash     Home Medications  Prior to Admission medications   Medication Sig Start Date End Date Taking? Authorizing Provider  atorvastatin (LIPITOR) 80 MG tablet Take 80 mg by mouth daily. 06/26/19  Yes [provider]  ezetimibe (ZETIA) 10 MG tablet Take 10 mg by mouth daily. 06/26/19  Yes [provider]  furosemide (LASIX) 40 MG tablet furosemide 40 mg tablet   Yes [provider]  acetaminophen (TYLENOL) 500 MG tablet Take 2 tablets (1,000 mg total) by mouth every 6 (six) hours as needed. 06/25/17   Betancourt, Aura Fey, NP  amLODipine (NORVASC) 2.5 MG tablet Take 2.5 mg by mouth daily. Patient not taking: No sig reported 08/07/19   [provider]  atorvastatin (LIPITOR) 10 MG tablet Take 10 mg by mouth daily. Patient not taking: No sig reported    [provider]  EPINEPHrine 0.3 mg/0.3 mL IJ SOAJ injection Inject into the muscle as directed. 11/23/18   [provider]  Fluticasone-Salmeterol (ADVAIR) 100-50 MCG/DOSE AEPB Inhale 1 puff into the lungs 2 (two) times daily. Patient not taking: Reported on 09/03/2020    [provider]  folic acid (FOLVITE) 1 MG tablet Take 1 mg by mouth daily. Patient not taking: No sig reported 12/10/18   [provider]  gabapentin (NEURONTIN) 100 MG capsule Take by mouth. Patient not taking: No sig reported 08/03/19    [provider]  isosorbide mononitrate (IMDUR) 30 MG 24 hr tablet isosorbide mononitrate ER 30 mg tablet,extended release 24 hr Patient not taking: No sig reported    [provider]  metoprolol tartrate (LOPRESSOR) 25 MG tablet Take 25 mg by mouth daily. Patient not taking: No sig reported 10/20/19   [provider]  nitroGLYCERIN (NITROSTAT) 0.4 MG SL tablet Place under the tongue. 07/22/19 07/21/20  [provider]  oxyCODONE (OXY IR/ROXICODONE) 5 MG immediate release tablet oxycodone 5 mg tablet  TAKE 1 TABLET BY MOUTH EVERY 6 HOURS AS NEEDED FOR UP TO  5 DAYS FOR SEVERE PAIN Patient not taking: No sig reported    [provider]  warfarin (COUMADIN) 1 MG tablet Take 1 mg by mouth daily. Patient not taking: No sig reported 02/05/19   [provider]  warfarin (COUMADIN) 6 MG tablet Take 6 mg by mouth at bedtime. 02/05/19   [provider]  cetirizine (ZYRTEC) 10 MG tablet Take 10 mg by mouth daily. Reported on 05/28/2015  04/16/19  [provider]  fluticasone (FLONASE) 50 MCG/ACT nasal spray Place 2 sprays into both nostrils daily. 08/10/17 04/16/19  Lorin Picket, PA-C         Critical Care Time devoted to patient care services described in this note is 65 minutes.     Corrin Parker, M.D.  [facility]  Medical Director [place] Director [facility] Cardio-Pulmonary Department

## 2020-09-03 NOTE — ED Provider Notes (Signed)
Patient signed out to me at 63 PM.  60 year old female presenting with a large rectal abscess meeting sepsis criteria.  She is in for fluids antibiotics and pending CT imaging and surgery consult.  Patient's lactate is within normal limits.  CT shows extensive cellulitis but no drainable collection.  I performed a repeat rectal exam given my concern that there was purulence and I cannot identify an obvious fluctuant area.  Patient's blood pressure trended down and despite adequate fluid resuscitation, 2700 cc including fluids by EMS, which is over 30 cc/kg, MAP still below 65.  My evaluation patient still is well-appearing she is awake and alert.  Not tachycardic but she is on a beta-blocker.  Given her persistent hypotension will start Levophed.  I discussed with the ICU provider who will admit the patient.  CRITICAL CARE Performed by: Madelin Headings   Total critical care time: 60 minutes  Critical care time was exclusive of separately billable procedures and treating other patients.  Critical care was necessary to treat or prevent imminent or life-threatening deterioration.  Critical care was time spent personally by me on the following activities: development of treatment plan with patient and/or surrogate as well as nursing, discussions with consultants, evaluation of patient's response to treatment, examination of patient, obtaining history from patient or surrogate, ordering and performing treatments and interventions, ordering and review of laboratory studies, ordering and review of radiographic studies, pulse oximetry and re-evaluation of patient's condition.  Patient with septic shock, critical care activities included fluid resuscitation and repeat assessment of vital signs and initiation of pressors.    Rada Hay, MD 09/03/20 [phone]

## 2020-09-03 NOTE — ED Notes (Signed)
ED provider at bedside.

## 2020-09-03 NOTE — Sepsis Progress Note (Signed)
[facility] is monitoring this sepsis.

## 2020-09-03 NOTE — ED Provider Notes (Signed)
[facility]    CSN: [id number] Arrival date & time: 09/03/20  1201      History   Chief Complaint Chief Complaint  Patient presents with   Hemorrhoids   Foot Pain   Shortness of Breath    HPI Theresa Phelps is a 60 y.o. female.   HPI  60 year old female here for evaluation of rectal bleeding.  Patient was dropped off by her son with a complaint of bright red blood per rectum for the past 2 days.  She is also complaining of bilateral foot pain for the last 2 days as well without any known injury.  She thinks that her dog stepped on her foot.  Patient is tachypneic, dyspneic, and febrile at triage.  She is not having any chest pain.  She is 98-100% SPO2 on room air.  She was evaluated by her cardiologist 2 days ago for chest pain.  She reports that the chest pain has resolved.  Patient states that she is having some abdominal pain on the right side of her abdomen that is not associated with nausea or vomiting.  When inquiring about patient's shortness of breath she states that she gets short of breath sometimes when she plays with her dog but she does not really recognize herself as being short of breath at present and she also denies any cough or upper respiratory symptoms such as runny nose nasal congestion.   Past Medical History:  Diagnosis Date   Acid reflux    Arthritis    rheumatoid arthritis   Asthma    High cholesterol    Peripheral vascular disease ([place])     Patient Active Problem List   Diagnosis Date Noted   Carotid artery stenosis, asymptomatic, right 05/27/2019   Pulmonary hypertension ([place]) 05/27/2019   Primary osteoarthritis of both knees 12/10/2018   Centrilobular emphysema ([place]) 11/28/2018   High risk medication use 11/23/2018   Rheumatoid arthritis, seropositive ([place]) 11/23/2018   Red blood cell antibody positive 01/19/2018   S/P insertion of iliac artery stent 01/16/2018   S/P mitral valve replacement with metallic valve [id number]    Warfarin anticoagulation 12/18/2017   AV block, 1st degree 12/17/2017   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[place]) 12/15/2017   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/14/2017   Coronary artery disease involving native coronary artery of native heart 11/10/2017   Mitral valve insufficiency 11/07/2017   Bilateral carotid artery stenosis 06/01/2017   Hyperlipidemia 06/01/2017   Prediabetes 05/31/2017   Tobacco use disorder 08/23/2016   Bilateral hand pain 08/23/2016   PAD (peripheral artery disease) ([place]) 04/18/2016   Lump or mass in breast 06/12/2012    Past Surgical History:  Procedure Laterality Date   BREAST BIOPSY Left 2014   LOWER EXTREMITY ANGIOGRAPHY Left 05/28/2015   Procedure: Lower Extremity Angiography;  Surgeon: Algernon Huxley, MD;  Location: [place] [facility];  Service: Cardiovascular;  Laterality: Left;   PERIPHERAL VASCULAR CATHETERIZATION Right 07/16/2015   Procedure: Lower Extremity Angiography;  Surgeon: Algernon Huxley, MD;  Location: [place] [facility];  Service: Cardiovascular;  Laterality: Right;   PERIPHERAL VASCULAR CATHETERIZATION  07/16/2015   Procedure: Lower Extremity Intervention;  Surgeon: Algernon Huxley, MD;  Location: [place] [facility];  Service: Cardiovascular;;   RIGHT/LEFT HEART CATH AND CORONARY ANGIOGRAPHY Bilateral 11/07/2017   Procedure: RIGHT/LEFT HEART CATH AND CORONARY ANGIOGRAPHY;  Surgeon: Corey Skains, MD;  Location: [place] [facility];  Service: Cardiovascular;  Laterality: Bilateral;   TEE WITHOUT CARDIOVERSION N/A  11/07/2017   Procedure: TRANSESOPHAGEAL ECHOCARDIOGRAM (TEE);  Surgeon: Corey Skains, MD;  Location: [facility];  Service: Cardiovascular;  Laterality: N/A;   WISDOM TOOTH EXTRACTION      OB History     Gravida  2   Para  2   Term      Preterm      AB      Living  2      SAB      IAB      Ectopic      Multiple      Live Births           Obstetric Comments  1st Menstrual Cycle: 11 1st Pregnancy: 18          Home  Medications    Prior to Admission medications   Medication Sig Start Date End Date Taking? Authorizing Provider  acetaminophen (TYLENOL) 500 MG tablet Take 2 tablets (1,000 mg total) by mouth every 6 (six) hours as needed. 06/25/17   Betancourt, Aura Fey, NP  amLODipine (NORVASC) 2.5 MG tablet Take 2.5 mg by mouth daily. 08/07/19   [provider]  atorvastatin (LIPITOR) 10 MG tablet Take 10 mg by mouth daily.    [provider]  atorvastatin (LIPITOR) 80 MG tablet Take 80 mg by mouth daily. 06/26/19   [provider]  EPINEPHrine 0.3 mg/0.3 mL IJ SOAJ injection Inject into the muscle as directed. 11/23/18   [provider]  ezetimibe (ZETIA) 10 MG tablet Take 10 mg by mouth daily. 06/26/19   [provider]  Fluticasone-Salmeterol (ADVAIR) 100-50 MCG/DOSE AEPB Inhale 1 puff into the lungs 2 (two) times daily.    [provider]  folic acid (FOLVITE) 1 MG tablet Take 1 mg by mouth daily. 12/10/18   [provider]  furosemide (LASIX) 40 MG tablet furosemide 40 mg tablet    [provider]  gabapentin (NEURONTIN) 100 MG capsule Take by mouth. 08/03/19   [provider]  isosorbide mononitrate (IMDUR) 30 MG 24 hr tablet isosorbide mononitrate ER 30 mg tablet,extended release 24 hr    [provider]  metoprolol tartrate (LOPRESSOR) 25 MG tablet Take 25 mg by mouth daily. 10/20/19   [provider]  nitroGLYCERIN (NITROSTAT) 0.4 MG SL tablet Place under the tongue. 07/22/19 07/21/20  [provider]  oxyCODONE (OXY IR/ROXICODONE) 5 MG immediate release tablet oxycodone 5 mg tablet  TAKE 1 TABLET BY MOUTH EVERY 6 HOURS AS NEEDED FOR UP TO 5 DAYS FOR SEVERE PAIN    [provider]  warfarin (COUMADIN) 1 MG tablet Take 1 mg by mouth daily. 02/05/19   [provider]  warfarin (COUMADIN) 6 MG tablet Take 6 mg by mouth at bedtime. 02/05/19   [provider]  cetirizine (ZYRTEC) 10 MG  tablet Take 10 mg by mouth daily. Reported on 05/28/2015  04/16/19  [provider]  fluticasone (FLONASE) 50 MCG/ACT nasal spray Place 2 sprays into both nostrils daily. 08/10/17 04/16/19  Lorin Picket, PA-C    Family History Family History  Problem Relation Age of Onset   Hypertension Mother    AAA (abdominal aortic aneurysm) Father    Other Father        bowel obstruction    Social History Social History   Tobacco Use   Smoking status: Former    Packs/day: 0.50    Years: 20.00    Pack years: 10.00    Types: Cigarettes    Quit  date: 12/2018    Years since quitting: 1.7   Smokeless tobacco: Never  Vaping Use   Vaping Use: Never used  Substance Use Topics   Alcohol use: Yes    Comment: twice week   Drug use: No     Allergies   Shellfish allergy, Banana, Garlic, Isosorbide nitrate, and Fish-derived products   Review of Systems Review of Systems  Constitutional:  Positive for fever.  Respiratory:  Positive for shortness of breath.   Gastrointestinal:  Positive for abdominal pain. Negative for anal bleeding and rectal pain.    Physical Exam Triage Vital Signs ED Triage Vitals  Enc Vitals Group     BP 09/03/20 1243 (!) 98/54     Pulse Rate 09/03/20 1243 (!) 110     Resp 09/03/20 1243 (!) 30     Temp 09/03/20 1243 (!) 103.2 F (39.6 C)     Temp src --      SpO2 09/03/20 1243 98 %     Weight --      Height --      Head Circumference --      Peak Flow --      Pain Score 09/03/20 1244 10     Pain Loc --      Pain Edu? --      Excl. in [place]? --    No data found.  Updated Vital Signs BP (!) 98/54 (BP Location: Left Arm)   Pulse (!) 110   Temp (!) 103.2 F (39.6 C)   Resp (!) 30   SpO2 98%   Visual Acuity Right Eye Distance:   Left Eye Distance:   Bilateral Distance:    Right Eye Near:   Left Eye Near:    Bilateral Near:     Physical Exam Vitals and nursing note reviewed.  Constitutional:      General: She is in acute distress.      Appearance: Normal appearance. She is obese. She is ill-appearing.  HENT:     Head: Normocephalic and atraumatic.  Cardiovascular:     Rate and Rhythm: Normal rate and regular rhythm.     Pulses: Normal pulses.     Heart sounds: Normal heart sounds. No murmur heard.   No gallop.  Pulmonary:     Effort: Pulmonary effort is normal.     Breath sounds: Normal breath sounds. No wheezing, rhonchi or rales.  Abdominal:     General: Bowel sounds are normal.     Palpations: Abdomen is soft.     Tenderness: There is no abdominal tenderness. There is no guarding or rebound.  Skin:    General: Skin is warm and dry.     Capillary Refill: Capillary refill takes less than 2 seconds.     Findings: Erythema present.  Neurological:     General: No focal deficit present.     Mental Status: She is alert and oriented to person, place, and time.  Psychiatric:        Mood and Affect: Mood normal.        Behavior: Behavior normal.        Thought Content: Thought content normal.        Judgment: Judgment normal.     UC Treatments / Results  Labs (all labs ordered are listed, but only abnormal results are displayed) Labs Reviewed - No data to display  EKG   Radiology No results found.  Procedures Procedures (including critical care time)  Medications Ordered in UC Medications  acetaminophen (TYLENOL) tablet 650 mg (650 mg Oral Given 09/03/20 1254)    Initial Impression / Assessment and Plan / UC Course  I have reviewed the triage vital signs and the nursing notes.  Pertinent labs & imaging results that were available during my care of the patient were reviewed by me and considered in my medical decision making (see chart for details).  Patient is an ill-appearing 60 year old female here for evaluation of 2 days worth of bright red blood per rectum that she thinks is secondary to hemorrhoids because she is having a lot of pain in her bottom when she sits.  She is also complaining of pain in  both feet and she thinks that her dog may have stepped on but she is unsure.  Patient is tachypneic with respirate of 30 and hypotensive with a blood pressure of 98/54.  She is also febrile with a temperature of 103.2 orally.  Patient is not hypoxic her SPO2 is 98% on room air.  Patient states that she does not feel short of breath despite her increased work of breathing and the fact that she has to recover after speaking in full sentences.  Patient's lung sounds are clear to auscultation all fields.  She is endorsing some abdomen pain on the right-hand side.  Abdomen is soft and nontender to palpation.  She has positive bowel sounds all 4 quadrants.  Patient assisted onto her side for rectal exam and rectal exam reveals the presence of pus and bright red blood from the rectal opening.  There is also a large area of erythema and induration that encompasses the medial aspect of the entire right buttock.  This area is tender to palpation.  There is no fluctuance appreciated.  Given patient's compromised hemodynamic status, age, fever, and presentation of blood and pus with rectum there is concern for possible rectal fistula versus abscess and I have recommended that she go to the emergency department for evaluation.  EMS was called.  Report was given to [facility] first responders.  Report was called to [facility] the charge nurse and [facility] ER.   Final Clinical Impressions(s) / UC Diagnoses   Final diagnoses:  Rectal bleeding  Fever, unspecified  Shortness of breath  Abscess of anal and rectal regions     Discharge Instructions      Please go to [facility] ER via EMS for treatment of your rectal abscess.     ED Prescriptions   None    PDMP not reviewed this encounter.   Margarette Canada, NP 09/03/20 1316

## 2020-09-03 NOTE — ED Notes (Signed)
EMS here at bedside.

## 2020-09-03 NOTE — Discharge Instructions (Addendum)
Please go to [facility] ER via EMS for treatment of your rectal abscess.

## 2020-09-03 NOTE — ED Triage Notes (Signed)
Pt arrives via ems from urgent care, she presented to urgent care with c/o possible hemorrhoids and symptoms of bright red rectal bleeding and rectal pain x2 days. Urgent care reported to EMS a rectal exam that revealed bright red blood and purulent drainage.    See EMS vitals - EMS initiated code sepsis, IV and administered 74m NS bolus.  Pt a&o x4 upon arrival to ED.

## 2020-09-03 NOTE — ED Notes (Signed)
Pt to CT

## 2020-09-03 NOTE — Consult Note (Signed)
PHARMACY -  BRIEF ANTIBIOTIC NOTE   Pharmacy has received consult(s) for sepsis from an ED provider.  The patient's profile has been reviewed for ht/wt/allergies/indication/available labs.    One time order(s) placed for vancomycin + cefepime  Further antibiotics/pharmacy consults should be ordered by admitting physician if indicated.                       Thank you, Oswald Hillock 09/03/2020  2:19 PM

## 2020-09-03 NOTE — Consult Note (Signed)
CODE SEPSIS - PHARMACY COMMUNICATION  **Broad Spectrum Antibiotics should be administered within 1 hour of Sepsis diagnosis**  Time Code Sepsis Called/Page Received: 1407  Antibiotics Ordered: cefepime and vancomycin and flagyl   Time of 1st antibiotic administration: 1438  Additional action taken by pharmacy: None    Oswald Hillock ,PharmD Clinical Pharmacist  09/03/2020  3:06 PM

## 2020-09-03 NOTE — ED Notes (Signed)
Attending provider notified of vital signs, see charted vital signs. Pt is a&o x4, resting comfortably in ED stretcher with family at bedside. Awaiting further orders.

## 2020-09-04 ENCOUNTER — Inpatient Hospital Stay: Payer: Medicaid Other | Admitting: Anesthesiology

## 2020-09-04 ENCOUNTER — Inpatient Hospital Stay: Payer: Medicaid Other

## 2020-09-04 ENCOUNTER — Encounter
Admission: EM | Disposition: A | Discharge status: Home / Self Care | Payer: Self-pay | Source: Home / Self Care | Attending: Pulmonary Disease

## 2020-09-04 DIAGNOSIS — K610 Anal abscess: Secondary | ICD-10-CM | POA: Diagnosis present

## 2020-09-04 DIAGNOSIS — L0231 Cutaneous abscess of buttock: Secondary | ICD-10-CM

## 2020-09-04 DIAGNOSIS — R6521 Severe sepsis with septic shock: Secondary | ICD-10-CM | POA: Diagnosis not present

## 2020-09-04 DIAGNOSIS — A419 Sepsis, unspecified organism: Secondary | ICD-10-CM | POA: Diagnosis not present

## 2020-09-04 DIAGNOSIS — N179 Acute kidney failure, unspecified: Secondary | ICD-10-CM | POA: Diagnosis present

## 2020-09-04 HISTORY — PX: INCISION AND DRAINAGE ABSCESS: SHX5864

## 2020-09-04 LAB — MRSA NEXT GEN BY PCR, NASAL: MRSA by PCR Next Gen: NOT DETECTED

## 2020-09-04 LAB — PROTIME-INR
INR: 8 (ref 0.8–1.2)
Prothrombin Time: 67 seconds — ABNORMAL HIGH (ref 11.4–15.2)

## 2020-09-04 LAB — BLOOD CULTURE ID PANEL (REFLEXED) - BCID2

## 2020-09-04 LAB — CBC
HCT: 36.7 % (ref 36.0–46.0)
Hemoglobin: 12.5 g/dL (ref 12.0–15.0)
MCH: 29.8 pg (ref 26.0–34.0)
MCHC: 34.1 g/dL (ref 30.0–36.0)
MCV: 87.6 fL (ref 80.0–100.0)
Platelets: 192 10*3/uL (ref 150–400)
RBC: 4.19 MIL/uL (ref 3.87–5.11)
RDW: 12.8 % (ref 11.5–15.5)
WBC: 21.9 10*3/uL — ABNORMAL HIGH (ref 4.0–10.5)
nRBC: 0 % (ref 0.0–0.2)

## 2020-09-04 LAB — BASIC METABOLIC PANEL
Anion gap: 5 (ref 5–15)
BUN: 14 mg/dL (ref 8–23)
CO2: 21 mmol/L — ABNORMAL LOW (ref 22–32)
Calcium: 7.6 mg/dL — ABNORMAL LOW (ref 8.9–10.3)
Chloride: 108 mmol/L (ref 98–111)
Creatinine, Ser: 0.83 mg/dL (ref 0.44–1.00)
GFR, Estimated: >60 mL/min (ref >60)
Glucose, Bld: 132 mg/dL — ABNORMAL HIGH (ref 70–99)
Potassium: 3.6 mmol/L (ref 3.5–5.1)
Sodium: 134 mmol/L — ABNORMAL LOW (ref 135–145)

## 2020-09-04 LAB — HIV ANTIBODY (ROUTINE TESTING W REFLEX): HIV Screen 4th Generation wRfx: NONREACTIVE

## 2020-09-04 LAB — PROCALCITONIN: Procalcitonin: 12.31 ng/mL

## 2020-09-04 LAB — GLUCOSE, CAPILLARY
Glucose-Capillary: 130 mg/dL — ABNORMAL HIGH (ref 70–99)
Glucose-Capillary: 167 mg/dL — ABNORMAL HIGH (ref 70–99)

## 2020-09-04 LAB — MAGNESIUM: Magnesium: 1.9 mg/dL (ref 1.7–2.4)

## 2020-09-04 LAB — PHOSPHORUS: Phosphorus: 1.9 mg/dL — ABNORMAL LOW (ref 2.5–4.6)

## 2020-09-04 SURGERY — INCISION AND DRAINAGE, ABSCESS
Anesthesia: General | Laterality: Right

## 2020-09-04 MED ORDER — SODIUM CHLORIDE 0.9 % IV BOLUS
500.0000 mL | Freq: Once | INTRAVENOUS | Status: AC
  Administered 2020-09-04: 500 mL via INTRAVENOUS

## 2020-09-04 MED ORDER — PHENYLEPHRINE HCL (PRESSORS) 10 MG/ML IV SOLN
INTRAVENOUS | Status: DC | PRN
  Administered 2020-09-04 (×2): 100 ug via INTRAVENOUS

## 2020-09-04 MED ORDER — KETAMINE HCL 10 MG/ML IJ SOLN
INTRAMUSCULAR | Status: DC | PRN
  Administered 2020-09-04: 20 mg via INTRAVENOUS

## 2020-09-04 MED ORDER — OXYCODONE HCL 5 MG/5ML PO SOLN
5.0000 mg | Freq: Once | ORAL | Status: DC | PRN
Start: 2020-09-04 — End: 2020-09-04

## 2020-09-04 MED ORDER — FENTANYL CITRATE (PF) 100 MCG/2ML IJ SOLN
INTRAMUSCULAR | Status: AC
  Filled 2020-09-04: qty 2

## 2020-09-04 MED ORDER — MIDAZOLAM HCL 2 MG/2ML IJ SOLN
INTRAMUSCULAR | Status: AC
  Filled 2020-09-04: qty 2

## 2020-09-04 MED ORDER — ACETAMINOPHEN 10 MG/ML IV SOLN: 1000.0000 mg | Freq: Once | INTRAVENOUS | Status: DC | PRN

## 2020-09-04 MED ORDER — AMIODARONE IV BOLUS ONLY 150 MG/100ML
150.0000 mg | Freq: Once | INTRAVENOUS | Status: AC
  Administered 2020-09-04: 150 mg via INTRAVENOUS
  Filled 2020-09-04: qty 100

## 2020-09-04 MED ORDER — ONDANSETRON HCL 4 MG/2ML IJ SOLN
INTRAMUSCULAR | Status: DC | PRN
Start: 2020-09-04 — End: 2020-09-04
  Administered 2020-09-04: 4 mg via INTRAVENOUS

## 2020-09-04 MED ORDER — EPHEDRINE SULFATE 50 MG/ML IJ SOLN
INTRAMUSCULAR | Status: DC | PRN
  Administered 2020-09-04 (×3): 5 mg via INTRAVENOUS

## 2020-09-04 MED ORDER — FENTANYL CITRATE (PF) 100 MCG/2ML IJ SOLN: 25.0000 ug | INTRAMUSCULAR | Status: DC | PRN

## 2020-09-04 MED ORDER — AMIODARONE HCL IN DEXTROSE 360-4.14 MG/200ML-% IV SOLN
60.0000 mg/h | INTRAVENOUS | Status: AC
  Filled 2020-09-04: qty 200

## 2020-09-04 MED ORDER — GUAIFENESIN-DM 100-10 MG/5ML PO SYRP
5.0000 mL | ORAL_SOLUTION | ORAL | Status: DC | PRN
  Administered 2020-09-05: 5 mL via ORAL
  Filled 2020-09-04: qty 5

## 2020-09-04 MED ORDER — PHENYLEPHRINE HCL (PRESSORS) 10 MG/ML IV SOLN
INTRAVENOUS | Status: AC
  Filled 2020-09-04: qty 1

## 2020-09-04 MED ORDER — KETAMINE HCL 50 MG/5ML IJ SOSY
PREFILLED_SYRINGE | INTRAMUSCULAR | Status: AC
  Filled 2020-09-04: qty 5

## 2020-09-04 MED ORDER — PROPOFOL 10 MG/ML IV BOLUS
INTRAVENOUS | Status: AC
  Filled 2020-09-04: qty 20

## 2020-09-04 MED ORDER — CHLORHEXIDINE GLUCONATE CLOTH 2 % EX PADS
6.0000 | MEDICATED_PAD | Freq: Every day | CUTANEOUS | Status: DC
  Administered 2020-09-05 – 2020-09-06 (×2): 6 via TOPICAL

## 2020-09-04 MED ORDER — DEXAMETHASONE SODIUM PHOSPHATE 10 MG/ML IJ SOLN
INTRAMUSCULAR | Status: DC | PRN
  Administered 2020-09-04: 10 mg via INTRAVENOUS

## 2020-09-04 MED ORDER — VANCOMYCIN HCL IN DEXTROSE 1-5 GM/200ML-% IV SOLN
1000.0000 mg | Freq: Two times a day (BID) | INTRAVENOUS | Status: DC
  Administered 2020-09-04 – 2020-09-06 (×5): 1000 mg via INTRAVENOUS
  Filled 2020-09-04 (×9): qty 200

## 2020-09-04 MED ORDER — SODIUM CHLORIDE 0.9 % IV SOLN
2.0000 g | Freq: Three times a day (TID) | INTRAVENOUS | Status: DC
  Administered 2020-09-04 – 2020-09-07 (×9): 2 g via INTRAVENOUS
  Filled 2020-09-04 (×11): qty 2

## 2020-09-04 MED ORDER — AMIODARONE HCL IN DEXTROSE 360-4.14 MG/200ML-% IV SOLN
30.0000 mg/h | INTRAVENOUS | Status: DC
  Administered 2020-09-04: 30 mg/h via INTRAVENOUS

## 2020-09-04 MED ORDER — POTASSIUM PHOSPHATES 15 MMOLE/5ML IV SOLN
15.0000 mmol | Freq: Once | INTRAVENOUS | Status: AC
  Administered 2020-09-04: 15 mmol via INTRAVENOUS
  Filled 2020-09-04: qty 5

## 2020-09-04 MED ORDER — MIDAZOLAM HCL 2 MG/2ML IJ SOLN
INTRAMUSCULAR | Status: DC | PRN
  Administered 2020-09-04 (×2): 1 mg via INTRAVENOUS

## 2020-09-04 MED ORDER — MAGNESIUM SULFATE 2 GM/50ML IV SOLN
2.0000 g | Freq: Once | INTRAVENOUS | Status: AC
  Administered 2020-09-04: 2 g via INTRAVENOUS
  Filled 2020-09-04: qty 50

## 2020-09-04 MED ORDER — LIDOCAINE HCL (CARDIAC) PF 100 MG/5ML IV SOSY
PREFILLED_SYRINGE | INTRAVENOUS | Status: DC | PRN
  Administered 2020-09-04: 100 mg via INTRAVENOUS

## 2020-09-04 MED ORDER — OXYCODONE HCL 5 MG PO TABS: 5.0000 mg | ORAL_TABLET | Freq: Once | ORAL | Status: DC | PRN

## 2020-09-04 MED ORDER — FENTANYL CITRATE (PF) 100 MCG/2ML IJ SOLN
50.0000 ug | INTRAMUSCULAR | Status: DC | PRN
Start: 2020-09-04 — End: 2020-09-06
  Administered 2020-09-04 (×3): 50 ug via INTRAVENOUS
  Filled 2020-09-04 (×3): qty 2

## 2020-09-04 MED ORDER — FENTANYL CITRATE (PF) 100 MCG/2ML IJ SOLN
INTRAMUSCULAR | Status: DC | PRN
  Administered 2020-09-04 (×4): 25 ug via INTRAVENOUS

## 2020-09-04 MED ORDER — PROPOFOL 500 MG/50ML IV EMUL
INTRAVENOUS | Status: DC | PRN
  Administered 2020-09-04: 120 ug/kg/min via INTRAVENOUS

## 2020-09-04 MED ORDER — METOPROLOL TARTRATE 5 MG/5ML IV SOLN
5.0000 mg | Freq: Once | INTRAVENOUS | Status: AC
  Administered 2020-09-04: 5 mg via INTRAVENOUS
  Filled 2020-09-04: qty 5

## 2020-09-04 MED ORDER — ONDANSETRON HCL 4 MG/2ML IJ SOLN: 4.0000 mg | Freq: Once | INTRAMUSCULAR | Status: DC | PRN

## 2020-09-04 MED ORDER — GLYCOPYRROLATE 0.2 MG/ML IJ SOLN
INTRAMUSCULAR | Status: DC | PRN
  Administered 2020-09-04: .2 mg via INTRAVENOUS

## 2020-09-04 SURGICAL SUPPLY — 19 items
BLADE SURG 15 STRL LF DISP TIS (BLADE) ×1 IMPLANT
BLADE SURG 15 STRL SS (BLADE) ×2
BNDG CONFORM 2 STRL LF (GAUZE/BANDAGES/DRESSINGS) ×2 IMPLANT
BNDG GAUZE 1X2.1 STRL (MISCELLANEOUS) ×2 IMPLANT
ELECT REM PT RETURN 9FT ADLT (ELECTROSURGICAL) ×2
ELECTRODE REM PT RTRN 9FT ADLT (ELECTROSURGICAL) ×1 IMPLANT
GAUZE 4X4 16PLY ~~LOC~~+RFID DBL (SPONGE) ×2 IMPLANT
GAUZE SPONGE 4X4 12PLY STRL (GAUZE/BANDAGES/DRESSINGS) ×2 IMPLANT
GLOVE SURG ENC MOIS LTX SZ6.5 (GLOVE) ×2 IMPLANT
GLOVE SURG UNDER POLY LF SZ6.5 (GLOVE) ×2 IMPLANT
GOWN STRL REUS W/ TWL LRG LVL3 (GOWN DISPOSABLE) ×2 IMPLANT
GOWN STRL REUS W/TWL LRG LVL3 (GOWN DISPOSABLE) ×4
MANIFOLD NEPTUNE II (INSTRUMENTS) ×2 IMPLANT
NEEDLE HYPO 22GX1.5 SAFETY (NEEDLE) ×2 IMPLANT
NS IRRIG 1000ML POUR BTL (IV SOLUTION) ×2 IMPLANT
PACK BASIN MINOR ARMC (MISCELLANEOUS) ×2 IMPLANT
SOL PREP PVP 2OZ (MISCELLANEOUS) ×2
SOLUTION PREP PVP 2OZ (MISCELLANEOUS) ×1 IMPLANT
WATER STERILE IRR 500ML POUR (IV SOLUTION) ×2 IMPLANT

## 2020-09-04 NOTE — Progress Notes (Signed)
PHARMACY - PHYSICIAN COMMUNICATION CRITICAL VALUE ALERT - BLOOD CULTURE IDENTIFICATION (BCID)  Theresa Phelps is an 60 y.o. female who presented to [facility] on 09/03/2020 with a chief complaint of BRBPR, low BP, gluteal cellulitis  Assessment:  9/1  blood culture with GPC 1 of 4 bottles.  BCID detects staphylococcus species (not S aureus, epidermidis).  Septic shock due to gluteal cellulitis.    Name of physician (or Provider) Contacted: Rosilyn Mings, NP  Current antibiotics: vancomycin, cefepime, metronidazole  Changes to prescribed antibiotics recommended:  Patient is on recommended antibiotics - No changes needed  Results for orders placed or performed during the hospital encounter of 09/03/20  Blood Culture ID Panel (Reflexed) (Collected: 09/03/2020  2:10 PM)  Result Value Ref Range   Enterococcus faecalis NOT DETECTED NOT DETECTED   Enterococcus Faecium NOT DETECTED NOT DETECTED   Listeria monocytogenes NOT DETECTED NOT DETECTED   Staphylococcus species DETECTED (A) NOT DETECTED   Staphylococcus aureus (BCID) NOT DETECTED NOT DETECTED   Staphylococcus epidermidis NOT DETECTED NOT DETECTED   Staphylococcus lugdunensis NOT DETECTED NOT DETECTED   Streptococcus species NOT DETECTED NOT DETECTED   Streptococcus agalactiae NOT DETECTED NOT DETECTED   Streptococcus pneumoniae NOT DETECTED NOT DETECTED   Streptococcus pyogenes NOT DETECTED NOT DETECTED   A.calcoaceticus-baumannii NOT DETECTED NOT DETECTED   Bacteroides fragilis NOT DETECTED NOT DETECTED   Enterobacterales NOT DETECTED NOT DETECTED   Enterobacter cloacae complex NOT DETECTED NOT DETECTED   Escherichia coli NOT DETECTED NOT DETECTED   Klebsiella aerogenes NOT DETECTED NOT DETECTED   Klebsiella oxytoca NOT DETECTED NOT DETECTED   Klebsiella pneumoniae NOT DETECTED NOT DETECTED   Proteus species NOT DETECTED NOT DETECTED   Salmonella species NOT DETECTED NOT DETECTED   Serratia marcescens NOT DETECTED NOT DETECTED    Haemophilus influenzae NOT DETECTED NOT DETECTED   Neisseria meningitidis NOT DETECTED NOT DETECTED   Pseudomonas aeruginosa NOT DETECTED NOT DETECTED   Stenotrophomonas maltophilia NOT DETECTED NOT DETECTED   Candida albicans NOT DETECTED NOT DETECTED   Candida auris NOT DETECTED NOT DETECTED   Candida glabrata NOT DETECTED NOT DETECTED   Candida krusei NOT DETECTED NOT DETECTED   Candida parapsilosis NOT DETECTED NOT DETECTED   Candida tropicalis NOT DETECTED NOT DETECTED   Cryptococcus neoformans/gattii NOT DETECTED NOT DETECTED   Doreene Eland, PharmD, BCPS.   Work Cell: [phone] 09/04/2020 12:42 PM

## 2020-09-04 NOTE — Progress Notes (Signed)
NAME:  Theresa Phelps, MRN:  [id number], DOB:  06/21/1960, LOS: 1 ADMISSION DATE:  09/03/2020 CONSULTATION DATE:  09/04/2020  REFERRING MD:  Starleen Blue CHIEF COMPLAINT:  low BP   History of Present Illness:  60 y.o. female with a past medical history of gastric reflux, asthma, hyperlipidemia, CAD, CABG, presents to the emergency department for rectal pain.    On examination patient found to have large left gluteal cellulitis/abscess with blood and pus   Patient was febrile to 103, transferred to the emergency department via EMS for further work-up and treatment.    Here the patient states she has had 2 days of rectal pain PTA.  No history of abscess previously.  No history of GI bleeding.  Patient is on Coumadin for prosthetic heart valve. No history of DM  Initial CT PELVIS did NOT show abscess +inflammation left gluteal region  Significant Hospital Events: Including procedures, antibiotic start and stop dates in addition to other pertinent events   9/1 CT pelvis 1. Perianal and left-sided perineal and gluteal inflammation consistent with cellulitis. No abscess identified. 2. Ectatic right common iliac artery with a suspected underlying short-segment dissection.  9/1 given 3 L IVF's placed on pressors  9/2 developed A. fib with RVR on amiodarone infusion  Micro Data:  COVID NEG BLOOD CULTURES >> positive for staph  Antimicrobials:   Antibiotics Given (last 72 hours)     Date/Time Action Medication Dose Rate   09/03/20 1438 New Bag/Given   ceFEPIme (MAXIPIME) 2 g in sodium chloride 0.9 % 100 mL IVPB 2 g 200 mL/hr   09/03/20 1500 New Bag/Given   metroNIDAZOLE (FLAGYL) IVPB 500 mg 500 mg 100 mL/hr   09/03/20 1610 New Bag/Given  [broad spectrum antibiotics given first]   vancomycin (VANCOCIN) IVPB 1000 mg/200 mL premix 1,000 mg 200 mL/hr   09/03/20 2151 New Bag/Given  [waiting on med from pharmacy]   vancomycin (VANCOREADY) IVPB 750 mg/150 mL 750 mg 150 mL/hr   09/04/20  0232 New Bag/Given   ceFEPIme (MAXIPIME) 2 g in sodium chloride 0.9 % 100 mL IVPB 2 g 200 mL/hr   09/04/20 1118 New Bag/Given   vancomycin (VANCOCIN) IVPB 1000 mg/200 mL premix 1,000 mg 200 mL/hr   09/04/20 1255 New Bag/Given   ceFEPIme (MAXIPIME) 2 g in sodium chloride 0.9 % 100 mL IVPB 2 g 200 mL/hr      Scheduled Meds:  Chlorhexidine Gluconate Cloth  6 each Topical Q0600   famotidine (PEPCID) IV  20 mg Intravenous Q12H   midodrine  10 mg Oral TID WC   sodium chloride flush  3 mL Intravenous Q12H   Continuous Infusions:  sodium chloride     sodium chloride 400 mL/hr at 09/04/20 1415   amiodarone Stopped (09/04/20 1139)   ceFEPime (MAXIPIME) IV 2 g (09/04/20 1255)   norepinephrine (LEVOPHED) Adult infusion Stopped (09/04/20 0112)   potassium PHOSPHATE IVPB (in mmol) 43 mL/hr at 09/04/20 1200   vancomycin 200 mL/hr at 09/04/20 1200   PRN Meds:.sodium chloride, acetaminophen, docusate sodium, fentaNYL (SUBLIMAZE) injection, ondansetron (ZOFRAN) IV, polyethylene glycol, sodium chloride flush   Subjective: No overt complaint today feels less pain.  She does feel her heart racing.  She current heart rate had gone up to the 140s.  A. fib with RVR.  She does not endorse any chest pain.  No shortness of breath.  Mentating well.  Objective   Blood pressure (!) 95/54, pulse (!) 56, temperature 99 F (37.2 C), temperature source Oral,  resp. rate 12, height '5\' 10"'$  (1.778 m), weight 91 kg, SpO2 97 %.       Intake/Output Summary (Last 24 hours) at 09/04/2020 1339 Last data filed at 09/04/2020 1200 Gross per 24 hour  Intake 6895.62 ml  Output 1500 ml  Net 5395.62 ml    Filed Weights   09/03/20 1423 09/04/20 0429  Weight: 85.7 kg 91 kg     Review of Systems: 10 point review of systems was performed and is as noted otherwise negative.  PHYSICAL EXAMINATION: GENERAL: Well-developed well-nourished woman, no acute distress.  Is laying on her side due to gluteal pain. HEAD:  Normocephalic, atraumatic.  EYES: Pupils equal, round, reactive to light.  No scleral icterus.  MOUTH: Oral mucosa moist, no thrush. NECK: Supple. No thyromegaly. Trachea midline. No JVD.  No adenopathy. PULMONARY: Good air entry bilaterally.  No adventitious sounds. CARDIOVASCULAR: S1 and S2.  Irregular rate and rhythm with RVR. ABDOMEN: Soft, nondistended, nontender, normal active bowel sounds. MUSCULOSKELETAL: No joint deformity, no clubbing, no edema.  NEUROLOGIC: Awake, alert, fully interactive. SKIN: Intact,warm,dry.  Is a left gluteal abscess with drainage noted please see photos on Elizabeth, NP's note (9/2, 2:22 AM) PSYCH: Mood and behavior normal.        ASSESSMENT & PLAN: Sepsis with septic shock due to Left Gluteal Cellulitis/abscess Lactic: 1.2, Baseline PCT: 14.08, UA: + pyuria, CT: Perianal and left-sided perineal and gluteal inflammation consistent with cellulitis  Initial interventions/workup included: 2 L of NS & Cefepime/ Vancomycin/ Metronidazole - Supplemental oxygen as needed, to maintain SpO2 > 90% - f/u cultures, trend lactic/ PCT - Daily CBC - monitor WBC/ fever curve - IV antibiotics: cefepime & vancomycin &Metronidazole - IV volume challenge - Continue Leophed to maintain MAP> 65 - Strict I/O's: Goal UOP >0.5 mL/kg/hr - Persistent hypotension consider stress dose steroids  - General surgery consult for incision and drainage, pus draining from wound at high risk for Fornier' gangrene given the location, contacted Dr. Peyton Najjar     Afib+RVR  Hx:Pos-op Afib with RVR, Mitral Valve replacement with metallic valve - EKG+Telemetry, monitor for worsening QTC prolongation while on Amiodarone - Troponins - TSH, FT4 - Metoprolol 25 PO bid as BP permits # Thromboembolism Risk Management with Warfarin per pharmacy protocol, goal INR 2-3. - TEEcho to exclude LAA thombus and embolus risk first,  - Amiodarone '150mg'$  IV bolus, then '1mg'$ /min x6 hrs, then 0.'5mg'$ /min for  18 hours - IV normal saline challenge - Extra extra magnesium 2 g IV x1     Intake/Output Summary (Last 24 hours) at 09/04/2020 1339 Last data filed at 09/04/2020 1200 Gross per 24 hour  Intake 6895.62 ml  Output 1500 ml  Net 5395.62 ml    BMP Latest Ref Rng & Units 09/04/2020 09/03/2020 09/03/2020  Glucose 70 - 99 mg/dL 132(H) - 208(H)  BUN 8 - 23 mg/dL 14 - 22  Creatinine 0.44 - 1.00 mg/dL 0.83 1.10(H) 1.34(H)  Sodium 135 - 145 mmol/L 134(L) - 126(L)  Potassium 3.5 - 5.1 mmol/L 3.6 - 3.3(L)  Chloride 98 - 111 mmol/L 108 - 97(L)  CO2 22 - 32 mmol/L 21(L) - 21(L)  Calcium 8.9 - 10.3 mg/dL 7.6(L) - 7.8(L)     Best practice (right click and "Reselect all SmartList Selections" daily)  DVT prophylaxis: On anticoagulation Mobility:  bed rest  Code Status:  FULL Disposition:ICU  Labs   CBC: Recent Labs  Lab 09/03/20 1400 09/03/20 2014 09/04/20 0520  WBC 27.3* 34.0* 21.9*  NEUTROABS 22.4*  --   --  HGB 12.9 12.3 12.5  HCT 37.6 36.6 36.7  MCV 85.5 87.4 87.6  PLT 222 209 192     Basic Metabolic Panel: Recent Labs  Lab 09/03/20 1400 09/03/20 2014 09/04/20 0520  NA 126*  --  134*  K 3.3*  --  3.6  CL 97*  --  108  CO2 21*  --  21*  GLUCOSE 208*  --  132*  BUN 22  --  14  CREATININE 1.34* 1.10* 0.83  CALCIUM 7.8*  --  7.6*  MG  --   --  1.9  PHOS  --   --  1.9*    GFR: Estimated Creatinine Clearance: 87.1 mL/min (by C-G formula based on SCr of 0.83 mg/dL). Recent Labs  Lab 09/03/20 1400 09/03/20 1410 09/03/20 1643 09/03/20 2014 09/04/20 0520  PROCALCITON  --   --   --  14.08 12.31  WBC 27.3*  --   --  34.0* 21.9*  LATICACIDVEN  --  1.2 1.5 1.6  --      Liver Function Tests: Recent Labs  Lab 09/03/20 1400  AST 22  ALT 15  ALKPHOS 75  BILITOT 1.1  PROT 6.8  ALBUMIN 2.9*    No results for input(s): LIPASE, AMYLASE in the last 168 hours. No results for input(s): AMMONIA in the last 168 hours.  ABG    Component Value Date/Time   PHART 7.46 (H)  09/04/2020 0500   PCO2ART 27 (L) 09/04/2020 0500   PO2ART 81 (L) 09/04/2020 0500   HCO3 19.2 (L) 09/04/2020 0500   ACIDBASEDEF 3.3 (H) 09/04/2020 0500   O2SAT 96.5 09/04/2020 0500     Coagulation Profile: Recent Labs  Lab 09/03/20 1400  INR 5.1*     Cardiac Enzymes: No results for input(s): CKTOTAL, CKMB, CKMBINDEX, TROPONINI in the last 168 hours.  HbA1C: Hgb A1c MFr Bld  Date/Time Value Ref Range Status  09/03/2020 08:14 PM 6.1 (H) 4.8 - 5.6 % Final    Comment:    (NOTE) Pre diabetes:          5.7%-6.4%  Diabetes:              >6.4%  Glycemic control for   <7.0% adults with diabetes     CBG: Recent Labs  Lab 09/04/20 0051 09/04/20 0712  GLUCAP 167* 130*    Allergies Allergies  Allergen Reactions   Shellfish Allergy Anaphylaxis   Banana     Other reaction(s): Other (See Comments) Burning in the mouth   Garlic     Other reaction(s): Other (See Comments) Pt states arms freeze up and can't talk but no SOB   Isosorbide Nitrate Itching    Blurred vision   Fish-Derived Products Rash      Patient was discussed in multidisciplinary rounds with ICU team.   Level 3 follow-up (35 minutes)  C. Derrill Kay, MD Advanced Bronchoscopy PCCM [facility]    *This note was dictated using voice recognition software/Dragon.  Despite best efforts to proofread, errors can occur which can change the meaning.  Any change was purely unintentional.

## 2020-09-04 NOTE — Progress Notes (Signed)
eLink Physician-Brief Progress Note Patient Name: Theresa Phelps DOB: [birth year] MRN: [id number]   Date of Service  09/04/2020  HPI/Events of Note  Patient admitted to the ICU secondary to septic shock from left gluteal cellulitis.  eICU Interventions  New Patient Evaluation.        Kerry Kass Melinda Pottinger 09/04/2020, 1:34 AM

## 2020-09-04 NOTE — Consult Note (Signed)
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 Pharmacy consulted for electrolyte mgmt during ICU admission.  Labs: (scr 1.34>1.1>0.83) Na: 126>134 (LR > NS '@100ml'$ /hr) K: 3.3>3.6 Cl: 97>108 Ca : 8.1 (corrected w/ Alb 2.9) Phos: 1.9 Mg: 1.9  Goal of Therapy:  Lytes WNL  Plan:  Lytes significantly improved this AM, AKI resolved w/ MIVF. Hypophosphotemia, 1.9 today will replete with 15 mmol Kphos x1 MD ordered 2g IV MgSO (9/02)  Lorna Dibble ,PharmD, [facility] Clinical Pharmacist 09/04/2020 8:53 AM

## 2020-09-04 NOTE — Consult Note (Signed)
Pharmacy Antibiotic Note  Theresa Phelps is a 60 y.o. female admitted on 09/03/2020 with left gluteal cellulitis. Pharmacy has been consulted for vancomycin and cefepime dosing.  Plan: Renal function improved (scr 1.34>0.83), no growth in cultures '@24hrs'$ , MRSA PCR negative, surgical I/D TBD by gen surg today. Increase cefepime 2 gram Q12H>Q8H based on renal function Vancomycin 1.75g loaded; then increase maintenance dose from 1.25g q24 to vanc 1g q12h Initiate vancomycin 1g mg Q12H. Goal AUC 400-550 Estimated AUC 474/Cmin 13.7 Scr 0.83, IBW, Vd 0.72 Pt stopped metronidazole Monitor renal function for dose adjustments Follow up cultures   Height: '5\' 10"'$  (177.8 cm) Weight: 91 kg (200 lb 9.9 oz) IBW/kg (Calculated) : 68.5  Temp (24hrs), Avg:99.9 F (37.7 C), Min:97.9 F (36.6 C), Max:103.2 F (39.6 C)  Recent Labs  Lab 09/03/20 1400 09/03/20 1410 09/03/20 1643 09/03/20 2014 09/04/20 0520  WBC 27.3*  --   --  34.0* 21.9*  CREATININE 1.34*  --   --  1.10* 0.83  LATICACIDVEN  --  1.2 1.5 1.6  --      Estimated Creatinine Clearance: 87.1 mL/min (by C-G formula based on SCr of 0.83 mg/dL).    Allergies  Allergen Reactions   Shellfish Allergy Anaphylaxis   Banana     Other reaction(s): Other (See Comments) Burning in the mouth   Garlic     Other reaction(s): Other (See Comments) Pt states arms freeze up and can't talk but no SOB   Isosorbide Nitrate Itching    Blurred vision   Fish-Derived Products Rash    Antimicrobials this admission: 9/1 cefepime >>  9/1 vancomycin >>  9/1 metronidazole x1   Dose adjustments this admission: Cefepime and vancomycin adjusted 9/02 (see plan for details)  Microbiology results: 9/1 BCx: [facility] 9/1 UCx: Pending   Thank you for allowing pharmacy to be a part of this patient's care.  Lorna Dibble, PharmD, [facility] Clinical Pharmacist   09/04/2020 8:45 AM

## 2020-09-04 NOTE — Transfer of Care (Signed)
Immediate Anesthesia Transfer of Care Note  Patient: Theresa Phelps  Procedure(s) Performed: INCISION AND DRAINAGE ABSCESS-Perianal (Right)  Patient Location: PACU  Anesthesia Type:General  Level of Consciousness: drowsy  Airway & Oxygen Therapy: Patient Spontanous Breathing and Patient connected to face mask oxygen  Post-op Assessment: Report given to RN and Post -op Vital signs reviewed and stable  Post vital signs: Reviewed and stable  Last Vitals:  Vitals Value Taken Time  BP 95/50 09/04/20 1424  Temp    Pulse 55 09/04/20 1427  Resp 20 09/04/20 1427  SpO2 100 % 09/04/20 1427  Vitals shown include unvalidated device data.  Last Pain:  Vitals:   09/04/20 0800  TempSrc: Oral  PainSc:          Complications: No notable events documented.

## 2020-09-04 NOTE — Consult Note (Signed)
SURGICAL CONSULTATION NOTE   HISTORY OF PRESENT ILLNESS (HPI):  60 y.o. female presented to [facility] ED for evaluation of perianal pain since 2 days ago. Patient reports she initially thought that there were hemorrhoids.  She was treated for hemorrhoid but the pain continue to get worse.  She developed cellulitis.  Pain does not radiate to the Parth body.  There has been no alleviating or aggravating factors.  At the ED she was found with sepsis.  Source was found to be perineal abscess.  Patient with critical INR upon admission.  Treatment received and last INR was reported as 1.1.  CT scan of the pelvis shows severe inflammation of the perianal area.  I personally believe the images.  No obvious fluid collection seen on the CT scan.  On physical exam patient was found with partially draining abscess.  Severely tender to palpation.  Associated cellulitis.  Surgery is consulted by Dr. Patsey Berthold in this context for evaluation and management of perianal abscess.  PAST MEDICAL HISTORY (PMH):  Past Medical History:  Diagnosis Date   Acid reflux    Arthritis    rheumatoid arthritis   Asthma    High cholesterol    Peripheral vascular disease ([place])      PAST SURGICAL HISTORY ([place]):  Past Surgical History:  Procedure Laterality Date   BREAST BIOPSY Left 2014   LOWER EXTREMITY ANGIOGRAPHY Left 05/28/2015   Procedure: Lower Extremity Angiography;  Surgeon: Algernon Huxley, MD;  Location: [place] [facility];  Service: Cardiovascular;  Laterality: Left;   PERIPHERAL VASCULAR CATHETERIZATION Right 07/16/2015   Procedure: Lower Extremity Angiography;  Surgeon: Algernon Huxley, MD;  Location: [place] [facility];  Service: Cardiovascular;  Laterality: Right;   PERIPHERAL VASCULAR CATHETERIZATION  07/16/2015   Procedure: Lower Extremity Intervention;  Surgeon: Algernon Huxley, MD;  Location: [place] [facility];  Service: Cardiovascular;;   RIGHT/LEFT HEART CATH AND CORONARY ANGIOGRAPHY Bilateral 11/07/2017    Procedure: RIGHT/LEFT HEART CATH AND CORONARY ANGIOGRAPHY;  Surgeon: Corey Skains, MD;  Location: [place] [facility];  Service: Cardiovascular;  Laterality: Bilateral;   TEE WITHOUT CARDIOVERSION N/A 11/07/2017   Procedure: TRANSESOPHAGEAL ECHOCARDIOGRAM (TEE);  Surgeon: Corey Skains, MD;  Location: [facility];  Service: Cardiovascular;  Laterality: N/A;   WISDOM TOOTH EXTRACTION       MEDICATIONS:  Prior to Admission medications   Medication Sig Start Date End Date Taking? Authorizing Provider  atorvastatin (LIPITOR) 80 MG tablet Take 80 mg by mouth daily. 06/26/19  Yes [provider]  ezetimibe (ZETIA) 10 MG tablet Take 10 mg by mouth daily. 06/26/19  Yes [provider]  furosemide (LASIX) 40 MG tablet furosemide 40 mg tablet   Yes [provider]  acetaminophen (TYLENOL) 500 MG tablet Take 2 tablets (1,000 mg total) by mouth every 6 (six) hours as needed. 06/25/17   Betancourt, Aura Fey, NP  amLODipine (NORVASC) 2.5 MG tablet Take 2.5 mg by mouth daily. Patient not taking: No sig reported 08/07/19   [provider]  atorvastatin (LIPITOR) 10 MG tablet Take 10 mg by mouth daily. Patient not taking: No sig reported    [provider]  EPINEPHrine 0.3 mg/0.3 mL IJ SOAJ injection Inject into the muscle as directed. 11/23/18   [provider]  Fluticasone-Salmeterol (ADVAIR) 100-50 MCG/DOSE AEPB Inhale 1 puff into the lungs 2 (two) times daily. Patient not taking: Reported on 09/03/2020    [provider]  folic acid (FOLVITE) 1 MG tablet  Take 1 mg by mouth daily. Patient not taking: No sig reported 12/10/18   [provider]  gabapentin (NEURONTIN) 100 MG capsule Take by mouth. Patient not taking: No sig reported 08/03/19   [provider]  isosorbide mononitrate (IMDUR) 30 MG 24 hr tablet isosorbide mononitrate ER 30 mg tablet,extended release 24 hr Patient not taking: No sig reported    [provider]   metoprolol tartrate (LOPRESSOR) 25 MG tablet Take 25 mg by mouth daily. Patient not taking: No sig reported 10/20/19   [provider]  nitroGLYCERIN (NITROSTAT) 0.4 MG SL tablet Place under the tongue. 07/22/19 07/21/20  [provider]  oxyCODONE (OXY IR/ROXICODONE) 5 MG immediate release tablet oxycodone 5 mg tablet  TAKE 1 TABLET BY MOUTH EVERY 6 HOURS AS NEEDED FOR UP TO 5 DAYS FOR SEVERE PAIN Patient not taking: No sig reported    [provider]  warfarin (COUMADIN) 1 MG tablet Take 1 mg by mouth daily. Patient not taking: No sig reported 02/05/19   [provider]  warfarin (COUMADIN) 6 MG tablet Take 6 mg by mouth at bedtime. 02/05/19   [provider]  cetirizine (ZYRTEC) 10 MG tablet Take 10 mg by mouth daily. Reported on 05/28/2015  04/16/19  [provider]  fluticasone (FLONASE) 50 MCG/ACT nasal spray Place 2 sprays into both nostrils daily. 08/10/17 04/16/19  Lorin Picket, PA-C     ALLERGIES:  Allergies  Allergen Reactions   Shellfish Allergy Anaphylaxis   Banana     Other reaction(s): Other (See Comments) Burning in the mouth   Garlic     Other reaction(s): Other (See Comments) Pt states arms freeze up and can't talk but no SOB   Isosorbide Nitrate Itching    Blurred vision   Fish-Derived Products Rash     SOCIAL HISTORY:  Social History   Socioeconomic History   Marital status: Single    Spouse name: Not on file   Number of children: Not on file   Years of education: Not on file   Highest education level: Not on file  Occupational History   Not on file  Tobacco Use   Smoking status: Former    Packs/day: 0.50    Years: 20.00    Pack years: 10.00    Types: Cigarettes    Quit date: 12/2018    Years since quitting: 1.7   Smokeless tobacco: Never  Vaping Use   Vaping Use: Never used  Substance and Sexual Activity   Alcohol use: Yes    Comment: twice week   Drug use: No   Sexual activity: Yes    Birth  control/protection: Post-menopausal  Other Topics Concern   Not on file  Social History Narrative   Not on file   Social Determinants of Health   Financial Resource Strain: Not on file  Food Insecurity: Not on file  Transportation Needs: Not on file  Physical Activity: Not on file  Stress: Not on file  Social Connections: Not on file  Intimate Partner Violence: Not on file      FAMILY HISTORY:  Family History  Problem Relation Age of Onset   Hypertension Mother    AAA (abdominal aortic aneurysm) Father    Other Father        bowel obstruction     REVIEW OF SYSTEMS:  Constitutional: denies weight loss, fever, chills, or sweats  Eyes: denies any other vision changes, history of eye injury  ENT: denies sore throat, hearing  problems  Respiratory: denies shortness of breath, wheezing  Cardiovascular: denies chest pain, palpitations  Gastrointestinal: Denies abdominal pain, nausea and vomiting Genitourinary: denies burning with urination or urinary frequency Musculoskeletal: denies any other joint pains or cramps  Skin: denies any other rashes or skin discolorations  Neurological: denies any other headache, dizziness, weakness  Psychiatric: denies any other depression, anxiety   All other review of systems were negative   VITAL SIGNS:  Temp:  [97.9 F (36.6 C)-99.8 F (37.7 C)] 99 F (37.2 C) (09/02 0800) Pulse Rate:  [44-150] 57 (09/02 1230) Resp:  [15-29] 22 (09/02 1230) BP: (61-156)/(34-85) 108/55 (09/02 1230) SpO2:  [90 %-100 %] 96 % (09/02 1230) Weight:  [85.7 kg-91 kg] 91 kg (09/02 0429)     Height: '5\' 10"'$  (177.8 cm) Weight: 91 kg BMI (Calculated): 28.79   INTAKE/OUTPUT:  This shift: Total I/O In: 1649.6 [I.V.:334.9; IV Piggyback:1314.7] Out: 1000 [Urine:1000]  Last 2 shifts: '@IOLAST2SHIFTS'$ @   PHYSICAL EXAM:  Constitutional:  -- Normal body habitus  -- Awake, alert, and oriented x3  Eyes:  -- Pupils equally round and reactive to light  -- No scleral  icterus  Ear, nose, and throat:  -- No jugular venous distension  Pulmonary:  -- No crackles  -- Equal breath sounds bilaterally -- Breathing non-labored at rest Cardiovascular:  -- S1, S2 present  -- No pericardial rubs Gastrointestinal:  -- Abdomen soft, nontender, non-distended, no guarding or rebound tenderness -- No abdominal masses appreciated, pulsatile or otherwise  --There is a small opening perianally with purulence.  Induration of the left buttock. Musculoskeletal and Integumentary:  -- Wounds: Left buttock open wound -- Extremities: B/L UE and LE FROM, hands and feet warm, no edema  Neurologic:  -- Motor function: intact and symmetric -- Sensation: intact and symmetric   Labs:  CBC Latest Ref Rng & Units 09/04/2020 09/03/2020 09/03/2020  WBC 4.0 - 10.5 K/uL 21.9(H) 34.0(H) 27.3(H)  Hemoglobin 12.0 - 15.0 g/dL 12.5 12.3 12.9  Hematocrit 36.0 - 46.0 % 36.7 36.6 37.6  Platelets 150 - 400 K/uL 192 209 222   CMP Latest Ref Rng & Units 09/04/2020 09/03/2020 09/03/2020  Glucose 70 - 99 mg/dL 132(H) - 208(H)  BUN 8 - 23 mg/dL 14 - 22  Creatinine 0.44 - 1.00 mg/dL 0.83 1.10(H) 1.34(H)  Sodium 135 - 145 mmol/L 134(L) - 126(L)  Potassium 3.5 - 5.1 mmol/L 3.6 - 3.3(L)  Chloride 98 - 111 mmol/L 108 - 97(L)  CO2 22 - 32 mmol/L 21(L) - 21(L)  Calcium 8.9 - 10.3 mg/dL 7.6(L) - 7.8(L)  Total Protein 6.5 - 8.1 g/dL - - 6.8  Total Bilirubin 0.3 - 1.2 mg/dL - - 1.1  Alkaline Phos 38 - 126 U/L - - 75  AST 15 - 41 U/L - - 22  ALT 0 - 44 U/L - - 15    Imaging studies:  CT scan of the pelvis shows severe stranding and an inflammatory signs of the perianal area.  No obvious fluid collection.  Assessment/Plan:  60 y.o. female with sepsis due to perianal abscess, complicated by pertinent comorbidities including coronary artery disease, mitral valve insufficiency, AV block first-degree, carotid artery stenosis, history of atrial fibrillation, pulmonary hypertension, hyperlipidemia.  Patient  with renal abscess with sepsis.  Elevated white cell count.  Currently draining spontaneously but not enough to control source of infection.  I recommend formal incision and drainage of the abscess at the OR due to patient pain.  Patient was oriented about the  plan.  INR normalized.  I discussed the case with ICU team which I agree the patient is a candidate for incision and drainage.  Patient consented to proceed with incision and drainage of perianal abscess.  Arnold Long, MD

## 2020-09-04 NOTE — Progress Notes (Signed)
    BRIEF OVERNIGHT PROGRESS REPORT   SUBJECTIVE:Patient seen and examined at the bedside per RN request. Rn report new onset Afib with RVR with rate as high as 150s.  OBJECTIVE:On arrival to the bedside, she was afebrile with blood pressure 116/71 mm Hg and pulse rate 148 beats/min. There were no focal neurological deficits; she was alert and oriented x4, with only complaints of left gluteal pain.Skin assessment as below:      ASSESSMENT & PLAN: Sepsis with septic shock due to suspected Left Gluteal Cellulitis Lactic: 1.2, Baseline PCT: 14.08, UA: + pyuria, CT: Perianal and left-sided perineal and gluteal inflammation consistent with cellulitis  Initial interventions/workup included: 2 L of NS & Cefepime/ Vancomycin/ Metronidazole - Supplemental oxygen as needed, to maintain SpO2 > 90% - f/u cultures, trend lactic/ PCT - Daily CBC - monitor WBC/ fever curve - IV antibiotics: cefepime & vancomycin &Metronidazole - IVF hydration as needed - Continue Leophed to maintain MAP> 65 - Strict I/O's: Goal UOP >0.5 mL/kg/hr - Persistent hypotension consider stress dose steroids  - General surgery consult for incision and drainage, pus draining from wound at high risk for Fornier' gangrene given the location.   Afib+RVR  Hx:Pos-op Afib with RVR, Mitral Valve replacement with metallic valve - EKG+Telemetry, monitor for worsening QTC prolongation while on Amiodarone - Troponins - TSH, FT4 - Metoprolol 25 PO bid as BP permits # Thromboembolism Risk Management with Warfarin per pharmacy protocol, goal INR 2-3. - TEEcho to exclude LAA thombus and embolus risk first,  - Amiodarone '150mg'$  IV bolus, then '1mg'$ /min x6 hrs, then 0.'5mg'$ /min for 18 hours - Cardiology consult as appropriate    Rufina Falco, DNP, CCRN, FNP-C, AGACNP-BC Acute Care Nurse Practitioner  [place] Pager: [phone] [place] at [facility]

## 2020-09-04 NOTE — Op Note (Signed)
Preoperative diagnosis: Perianal abscess   Postoperative diagnosis: Perianal abscess  Procedure: Incision and drainage of of perianal abscess.  Anesthesia: Sedation  Surgeon: Dr. Windell Moment, MD  Wound Classification: Contaminated  Indications: Patient is a 60 y.o. female  presented with an abscess on the perianal area.   Findings:  1. Abscess on the left perianal area.  2. Abundant purulent secretions drained and cultured 3. Adequate hemostasis.   Description of procedure: The patient was placed in the right decubitus position and monitored anesthesia anesthesia was induced. The perianal area was prepped and draped in the usual sterile fashion. A timeout was completed verifying correct patient, procedure, site, positioning, and implant(s) and/or special equipment prior to beginning this procedure.  A skin incision was made on the left perianal aera. The wound was opened and purulent secretions was drained. With a hemostat and Yankauer blunt dissection of septas was done to be able to drain the abscess completely.  The cavity flushed with saline until clear saline was aspirated. The wound was packed and wound left opened. Sterile dressing left in place.   The patient tolerated the procedure well and was taken to the postanesthesia care unit in satisfactory condition.   Specimen: Perianal abscess culture  Complications: None  Estimated Blood Loss: 2 mL

## 2020-09-04 NOTE — Anesthesia Procedure Notes (Signed)
Procedure Name: MAC Date/Time: 09/04/2020 1:54 PM Performed by: Lily Peer, Quinnton Bury, CRNA Pre-anesthesia Checklist: Patient identified, Emergency Drugs available, Suction available, Patient being monitored and Timeout performed Patient Re-evaluated:Patient Re-evaluated prior to induction Oxygen Delivery Method: Simple face mask Induction Type: IV induction

## 2020-09-04 NOTE — ED Notes (Signed)
Attempted to call report at this time 

## 2020-09-04 NOTE — Anesthesia Preprocedure Evaluation (Signed)
Anesthesia Evaluation  Patient identified by MRN, date of birth, ID band Patient awake    Reviewed: Allergy & Precautions, NPO status , Patient's Chart, lab work & pertinent test results  History of Anesthesia Complications Negative for: history of anesthetic complications  Airway Mallampati: III  TM Distance: >3 FB Neck ROM: Full    Dental  (+) Edentulous Upper, Edentulous Lower   Pulmonary asthma , neg sleep apnea, COPD, Patient abstained from smoking.Not current smoker, former smoker,    Pulmonary exam normal breath sounds clear to auscultation       Cardiovascular Exercise Tolerance: Poor METS(-) hypertension+ CAD, + Past MI, + Cardiac Stents, + CABG and + Peripheral Vascular Disease  + dysrhythmias Atrial Fibrillation + Cardiac Defibrillator  Rhythm:Regular Rate:Bradycardia - Systolic murmurs TTE [id number] (stented after this) INTERPRETATION  REGIONALLY-IMPAIRED LV WITH PRESERVED LEFT VENTRICULAR SYSTOLIC FUNCTION; ESTIMATED EF =  40%  NORMAL RIGHT VENTRICULAR SYSTOLIC FUNCTION  MILD VALVULAR REGURGITATION (See above)  NO VALVULAR STENOSIS  MILD LA ENLARGEMENT  NORMAL FUNCTION OF MITRAL VALVE PROSTHESIS  BASAL-TO-APICAL INFERIOR POSTERIOR WALL HYPOKINESIS     Neuro/Psych negative neurological ROS  negative psych ROS   GI/Hepatic GERD  ,(+)     (-) substance abuse  ,   Endo/Other  neg diabetes  Renal/GU negative Renal ROS     Musculoskeletal   Abdominal   Peds  Hematology   Anesthesia Other Findings Past Medical History: No date: Acid reflux No date: Arthritis     Comment:  rheumatoid arthritis No date: Asthma No date: High cholesterol No date: Peripheral vascular disease (HCC)  Reproductive/Obstetrics                             Anesthesia Physical Anesthesia Plan  ASA: 3  Anesthesia Plan: General   Post-op Pain Management:    Induction: Intravenous  PONV Risk Score  and Plan: 3 and Ondansetron, Propofol infusion, TIVA and Treatment may vary due to age or medical condition  Airway Management Planned: Nasal Cannula and Natural Airway  Additional Equipment: None  Intra-op Plan:   Post-operative Plan:   Informed Consent: I have reviewed the patients History and Physical, chart, labs and discussed the procedure including the risks, benefits and alternatives for the proposed anesthesia with the patient or authorized representative who has indicated his/her understanding and acceptance.     Dental advisory given  Plan Discussed with: CRNA and Surgeon  Anesthesia Plan Comments: (Discussed risks of anesthesia with patient, including possibility of difficulty with spontaneous ventilation under anesthesia necessitating airway intervention, PONV, and rare risks such as cardiac or respiratory or neurological events, and allergic reactions. Patient understands.)        Anesthesia Quick Evaluation

## 2020-09-05 ENCOUNTER — Encounter: Payer: Self-pay | Admitting: General Surgery

## 2020-09-05 DIAGNOSIS — I4891 Unspecified atrial fibrillation: Secondary | ICD-10-CM | POA: Diagnosis not present

## 2020-09-05 DIAGNOSIS — R6521 Severe sepsis with septic shock: Secondary | ICD-10-CM | POA: Diagnosis not present

## 2020-09-05 DIAGNOSIS — L0231 Cutaneous abscess of buttock: Secondary | ICD-10-CM | POA: Diagnosis not present

## 2020-09-05 DIAGNOSIS — A419 Sepsis, unspecified organism: Secondary | ICD-10-CM | POA: Diagnosis not present

## 2020-09-05 LAB — BASIC METABOLIC PANEL
Anion gap: 7 (ref 5–15)
BUN: 13 mg/dL (ref 8–23)
CO2: 21 mmol/L — ABNORMAL LOW (ref 22–32)
Calcium: 8.2 mg/dL — ABNORMAL LOW (ref 8.9–10.3)
Chloride: 106 mmol/L (ref 98–111)
Creatinine, Ser: 0.87 mg/dL (ref 0.44–1.00)
GFR, Estimated: >60 mL/min (ref >60)
Glucose, Bld: 297 mg/dL — ABNORMAL HIGH (ref 70–99)
Potassium: 4.5 mmol/L (ref 3.5–5.1)
Sodium: 134 mmol/L — ABNORMAL LOW (ref 135–145)

## 2020-09-05 LAB — CBC WITH DIFFERENTIAL/PLATELET
Abs Immature Granulocytes: 0.17 10*3/uL — ABNORMAL HIGH (ref 0.00–0.07)
Basophils Absolute: 0.1 10*3/uL (ref 0.0–0.1)
Basophils Relative: 0 %
Eosinophils Absolute: 0 10*3/uL (ref 0.0–0.5)
Eosinophils Relative: 0 %
HCT: 34 % — ABNORMAL LOW (ref 36.0–46.0)
Hemoglobin: 11.5 g/dL — ABNORMAL LOW (ref 12.0–15.0)
Immature Granulocytes: 1 %
Lymphocytes Relative: 5 %
Lymphs Abs: 1.1 10*3/uL (ref 0.7–4.0)
MCH: 30.3 pg (ref 26.0–34.0)
MCHC: 33.8 g/dL (ref 30.0–36.0)
MCV: 89.5 fL (ref 80.0–100.0)
Monocytes Absolute: 1.1 10*3/uL — ABNORMAL HIGH (ref 0.1–1.0)
Monocytes Relative: 4 %
Neutro Abs: 21.8 10*3/uL — ABNORMAL HIGH (ref 1.7–7.7)
Neutrophils Relative %: 90 %
Platelets: 218 10*3/uL (ref 150–400)
RBC: 3.8 MIL/uL — ABNORMAL LOW (ref 3.87–5.11)
RDW: 13.2 % (ref 11.5–15.5)
Smear Review: NORMAL
WBC: 24.2 10*3/uL — ABNORMAL HIGH (ref 4.0–10.5)
nRBC: 0 % (ref 0.0–0.2)

## 2020-09-05 LAB — MAGNESIUM: Magnesium: 2.5 mg/dL — ABNORMAL HIGH (ref 1.7–2.4)

## 2020-09-05 LAB — PROTIME-INR
INR: 6.4 (ref 0.8–1.2)
Prothrombin Time: 55.9 seconds — ABNORMAL HIGH (ref 11.4–15.2)

## 2020-09-05 LAB — PHOSPHORUS: Phosphorus: 2.8 mg/dL (ref 2.5–4.6)

## 2020-09-05 LAB — URINE CULTURE: Culture: <10000 — AB

## 2020-09-05 LAB — PROCALCITONIN: Procalcitonin: 7.05 ng/mL

## 2020-09-05 LAB — TSH: TSH: 0.486 u[IU]/mL (ref 0.350–4.500)

## 2020-09-05 LAB — T4, FREE: Free T4: 1.09 ng/dL (ref 0.61–1.12)

## 2020-09-05 MED ORDER — GABAPENTIN 100 MG PO CAPS
100.0000 mg | ORAL_CAPSULE | Freq: Once | ORAL | Status: AC
  Administered 2020-09-05: 100 mg via ORAL
  Filled 2020-09-05: qty 1

## 2020-09-05 NOTE — Progress Notes (Signed)
Pt with asymptomatic bradycardia, EKG done, provider aware and in unit. Pain controlled with PRN tylenol and fentanly, pt refuses bath and request delay on dressing change. No other significant events.

## 2020-09-05 NOTE — Plan of Care (Signed)
  Problem: Education: Goal: Knowledge of General Education information will improve Description Including pain rating scale, medication(s)/side effects and non-pharmacologic comfort measures Outcome: Progressing   Problem: Health Behavior/Discharge Planning: Goal: Ability to manage health-related needs will improve Outcome: Progressing   Problem: Clinical Measurements: Goal: Ability to maintain clinical measurements within normal limits will improve Outcome: Progressing Goal: Will remain free from infection Outcome: Progressing Goal: Diagnostic test results will improve Outcome: Progressing Goal: Respiratory complications will improve Outcome: Progressing Goal: Cardiovascular complication will be avoided Outcome: Progressing   Problem: Activity: Goal: Risk for activity intolerance will decrease Outcome: Progressing   Problem: Nutrition: Goal: Adequate nutrition will be maintained Outcome: Progressing   Problem: Coping: Goal: Level of anxiety will decrease Outcome: Progressing   Problem: Elimination: Goal: Will not experience complications related to bowel motility Outcome: Progressing Goal: Will not experience complications related to urinary retention Outcome: Progressing   Problem: Pain Managment: Goal: General experience of comfort will improve Outcome: Progressing   Problem: Safety: Goal: Ability to remain free from injury will improve Outcome: Progressing   Problem: Skin Integrity: Goal: Risk for impaired skin integrity will decrease Outcome: Progressing   Problem: Clinical Measurements: Goal: Postoperative complications will be avoided or minimized Outcome: Progressing   Problem: Skin Integrity: Goal: Demonstration of wound healing without infection will improve Outcome: Progressing   

## 2020-09-05 NOTE — Progress Notes (Signed)
Patient ID: Theresa Phelps, female   DOB: 1960/11/15, 60 y.o.   MRN: [id number]     [place] [facility] Day(s): 2.   Interval History: Patient seen and examined, no acute events or new complaints overnight. Patient reports feeling better.  Endorses that the pain has improved.  Vital signs in last 24 hours: [min-max] current  Temp:  [96.1 F (35.6 C)-98.2 F (36.8 C)] 96.1 F (35.6 C) (09/03 0800) Pulse Rate:  [35-73] 35 (09/03 0800) Resp:  [12-25] 18 (09/03 0800) BP: (89-149)/(44-87) 148/71 (09/03 0800) SpO2:  [90 %-100 %] 92 % (09/03 0800) Weight:  [92 kg] 92 kg (09/03 0500)     Height: '5\' 10"'$  (177.8 cm) Weight: 92 kg BMI (Calculated): 29.1   Physical Exam:  Constitutional: alert, cooperative and no distress  Rectal: Wound without any purulence.  Induration improving.  Cellulitis improving.  Labs:  CBC Latest Ref Rng & Units 09/05/2020 09/04/2020 09/03/2020  WBC 4.0 - 10.5 K/uL 24.2(H) 21.9(H) 34.0(H)  Hemoglobin 12.0 - 15.0 g/dL 11.5(L) 12.5 12.3  Hematocrit 36.0 - 46.0 % 34.0(L) 36.7 36.6  Platelets 150 - 400 K/uL 218 192 209   CMP Latest Ref Rng & Units 09/05/2020 09/04/2020 09/03/2020  Glucose 70 - 99 mg/dL 297(H) 132(H) -  BUN 8 - 23 mg/dL 13 14 -  Creatinine 0.44 - 1.00 mg/dL 0.87 0.83 1.10(H)  Sodium 135 - 145 mmol/L 134(L) 134(L) -  Potassium 3.5 - 5.1 mmol/L 4.5 3.6 -  Chloride 98 - 111 mmol/L 106 108 -  CO2 22 - 32 mmol/L 21(L) 21(L) -  Calcium 8.9 - 10.3 mg/dL 8.2(L) 7.6(L) -  Total Protein 6.5 - 8.1 g/dL - - -  Total Bilirubin 0.3 - 1.2 mg/dL - - -  Alkaline Phos 38 - 126 U/L - - -  AST 15 - 41 U/L - - -  ALT 0 - 44 U/L - - -    Imaging studies: No new pertinent imaging studies   Assessment/Plan:  60 y.o. female with sepsis due to perianal abscess 1 Day Post-Op s/p incision and drainage.  Surgical area improving.  I personally change the dressing today.  I put tape of the gauze as a packing.  No purulence, improved cellulitis and induration.  No  recommendation is to keep simple local care with small packing and gauze on the outside for absorption.  Continue IV antibiotic therapy due to her cardiac history and still elevated white blood cell count.  No further surgical management needed.  Continue medical management as per primary team.  Arnold Long, MD

## 2020-09-05 NOTE — Progress Notes (Signed)
NAME:  Theresa Phelps, MRN:  [id number], DOB:  1960/03/29, LOS: 2 ADMISSION DATE:  09/03/2020 CONSULTATION DATE:  09/05/2020  REFERRING MD:  Starleen Blue CHIEF COMPLAINT:  low BP   History of Present Illness:  60 y.o. female with a past medical history of gastric reflux, asthma, hyperlipidemia, CAD, CABG, presents to the emergency department for rectal pain.    On examination patient found to have large left gluteal cellulitis/abscess with blood and pus   Patient was febrile to 103, transferred to the emergency department via EMS for further work-up and treatment.    Here the patient states she has had 2 days of rectal pain PTA.  No history of abscess previously.  No history of GI bleeding.  Patient is on Coumadin for prosthetic heart valve. No history of DM  Initial CT PELVIS did NOT show abscess +inflammation left gluteal region  Significant Hospital Events: Including procedures, antibiotic start and stop dates in addition to other pertinent events   9/1 CT pelvis 1. Perianal and left-sided perineal and gluteal inflammation consistent with cellulitis. No abscess identified. 2. Ectatic right common iliac artery with a suspected underlying short-segment dissection.  9/1 given 3 L IVF's placed on pressors  9/2 recurrent A. fib with RVR on amiodarone infusion, debridement of left gluteal abscess  9/3 amiodarone off, no recurrent episodes of RVR no need for pressors.  Doing well transfer out of SDU  Micro Data:  COVID NEG BLOOD CULTURES >> positive for staph  Antimicrobials:   Antibiotics Given (last 72 hours)     Date/Time Action Medication Dose Rate   09/03/20 1438 New Bag/Given   ceFEPIme (MAXIPIME) 2 g in sodium chloride 0.9 % 100 mL IVPB 2 g 200 mL/hr   09/03/20 1500 New Bag/Given   metroNIDAZOLE (FLAGYL) IVPB 500 mg 500 mg 100 mL/hr   09/03/20 1610 New Bag/Given  [broad spectrum antibiotics given first]   vancomycin (VANCOCIN) IVPB 1000 mg/200 mL premix 1,000 mg 200  mL/hr   09/03/20 2151 New Bag/Given  [waiting on med from pharmacy]   vancomycin (VANCOREADY) IVPB 750 mg/150 mL 750 mg 150 mL/hr   09/04/20 0232 New Bag/Given   ceFEPIme (MAXIPIME) 2 g in sodium chloride 0.9 % 100 mL IVPB 2 g 200 mL/hr   09/04/20 1118 New Bag/Given   vancomycin (VANCOCIN) IVPB 1000 mg/200 mL premix 1,000 mg 200 mL/hr   09/04/20 1255 New Bag/Given   ceFEPIme (MAXIPIME) 2 g in sodium chloride 0.9 % 100 mL IVPB 2 g 200 mL/hr   09/04/20 2349 New Bag/Given   vancomycin (VANCOCIN) IVPB 1000 mg/200 mL premix 1,000 mg 200 mL/hr   09/05/20 0211 New Bag/Given   ceFEPIme (MAXIPIME) 2 g in sodium chloride 0.9 % 100 mL IVPB 2 g 200 mL/hr   09/05/20 0601 New Bag/Given   ceFEPIme (MAXIPIME) 2 g in sodium chloride 0.9 % 100 mL IVPB 2 g 200 mL/hr      Scheduled Meds:  Chlorhexidine Gluconate Cloth  6 each Topical Q0600   famotidine (PEPCID) IV  20 mg Intravenous Q12H   midodrine  10 mg Oral TID WC   sodium chloride flush  3 mL Intravenous Q12H   Continuous Infusions:  sodium chloride     sodium chloride 400 mL/hr at 09/04/20 1415   amiodarone Stopped (09/04/20 1139)   ceFEPime (MAXIPIME) IV 2 g (09/05/20 0601)   norepinephrine (LEVOPHED) Adult infusion Stopped (09/04/20 0112)   vancomycin Stopped (09/05/20 0100)   PRN Meds:.sodium chloride, acetaminophen, docusate sodium, fentaNYL (  SUBLIMAZE) injection, guaiFENesin-dextromethorphan, ondansetron (ZOFRAN) IV, polyethylene glycol, sodium chloride flush   Subjective: No overt complaint today feels less pain.  She does feel her heart racing.  She current heart rate had gone up to the 140s.  A. fib with RVR.  She does not endorse any chest pain.  No shortness of breath.  Mentating well.  Objective   Blood pressure (!) 148/71, pulse (!) 35, temperature 97.6 F (36.4 C), temperature source Oral, resp. rate 18, height '5\' 10"'$  (1.778 m), weight 92 kg, SpO2 92 %.       Intake/Output Summary (Last 24 hours) at 09/05/2020 0914 Last data  filed at 09/05/2020 [id number] Gross per 24 hour  Intake 1781.02 ml  Output 950 ml  Net 831.02 ml    Filed Weights   09/03/20 1423 09/04/20 0429 09/05/20 0500  Weight: 85.7 kg 91 kg 92 kg     Review of Systems: 10 point review of systems was performed and is as noted otherwise negative.  PHYSICAL EXAMINATION: GENERAL: Well-developed well-nourished woman, no acute distress.  Sitting up in breakfast.   HEAD: Normocephalic, atraumatic.  EYES: Pupils equal, round, reactive to light.  No scleral icterus.  MOUTH: Oral mucosa moist, no thrush. NECK: Supple. No thyromegaly. Trachea midline. No JVD.  No adenopathy. PULMONARY: Good air entry bilaterally.  No adventitious sounds. CARDIOVASCULAR: S1 and S2.  Sinus bradycardia, rate 46, crisp valve sounds. ABDOMEN: Soft, nondistended, nontender, normoactive bowel sounds.  Tolerating p.o. intake well. MUSCULOSKELETAL: No joint deformity, no clubbing, no edema.  NEUROLOGIC: Awake, alert, fully interactive. SKIN: Intact,warm,dry.  Status post left gluteal abscess drainage.   PSYCH: Mood and behavior normal.     ASSESSMENT & PLAN: Sepsis with septic shock due to Left Gluteal Cellulitis/abscess Sepsis physiology RESOLVED Lactic: 1.2, Baseline PCT: 14.08, UA: + pyuria, CT: Perianal and left-sided perineal and gluteal inflammation consistent with cellulitis, clinically consistent with abscess Initial interventions/workup included: 2 L of NS & Cefepime/ Vancomycin/ Metronidazole - Supplemental oxygen as needed, to maintain SpO2 > 90% - f/u cultures, trend lactic/ PCT - Daily CBC - monitor WBC/ fever curve - IV antibiotics: cefepime & vancomycin &Metronidazole - Follow-up on cultures and narrow antibiotics once cultures known - IV volume challenge - Off of pressors - Strict I/O's: Goal UOP >0.5 mL/kg/hr - S/P post I&D 9/2 - Marked improvement overall can transfer out of stepdown  Afib+RVR  Now bradycardid Hx:Pos-op Afib with RVR, Mitral Valve  replacement with metallic valve - EKG+Telemetry - TSH, FT4, ordered - D/C [place] as may be causing reflex bradycardia. - Thromboembolism Risk Management with Warfarin per pharmacy protocol, goal INR 2-3  Hyperglycemia No prior history of diabetes -Carb modified diet -Continue to monitor     Intake/Output Summary (Last 24 hours) at 09/05/2020 0914 Last data filed at 09/05/2020 [id number] Gross per 24 hour  Intake 1781.02 ml  Output 950 ml  Net 831.02 ml    BMP Latest Ref Rng & Units 09/05/2020 09/04/2020 09/03/2020  Glucose 70 - 99 mg/dL 297(H) 132(H) -  BUN 8 - 23 mg/dL 13 14 -  Creatinine 0.44 - 1.00 mg/dL 0.87 0.83 1.10(H)  Sodium 135 - 145 mmol/L 134(L) 134(L) -  Potassium 3.5 - 5.1 mmol/L 4.5 3.6 -  Chloride 98 - 111 mmol/L 106 108 -  CO2 22 - 32 mmol/L 21(L) 21(L) -  Calcium 8.9 - 10.3 mg/dL 8.2(L) 7.6(L) -     Best practice (right click and "Reselect all SmartList Selections" daily)  DVT prophylaxis: On anticoagulation  Mobility:  bed rest  Code Status:  FULL Disposition:ICU  Labs   CBC: Recent Labs  Lab 09/03/20 1400 09/03/20 2014 09/04/20 0520 09/05/20 0529  WBC 27.3* 34.0* 21.9* 24.2*  NEUTROABS 22.4*  --   --  21.8*  HGB 12.9 12.3 12.5 11.5*  HCT 37.6 36.6 36.7 34.0*  MCV 85.5 87.4 87.6 89.5  PLT 222 209 192 218     Basic Metabolic Panel: Recent Labs  Lab 09/03/20 1400 09/03/20 2014 09/04/20 0520 09/05/20 0529  NA 126*  --  134* 134*  K 3.3*  --  3.6 4.5  CL 97*  --  108 106  CO2 21*  --  21* 21*  GLUCOSE 208*  --  132* 297*  BUN 22  --  14 13  CREATININE 1.34* 1.10* 0.83 0.87  CALCIUM 7.8*  --  7.6* 8.2*  MG  --   --  1.9 2.5*  PHOS  --   --  1.9* 2.8    GFR: Estimated Creatinine Clearance: 83.5 mL/min (by C-G formula based on SCr of 0.87 mg/dL). Recent Labs  Lab 09/03/20 1400 09/03/20 1410 09/03/20 1643 09/03/20 2014 09/04/20 0520 09/05/20 0529  PROCALCITON  --   --   --  14.08 12.31 7.05  WBC 27.3*  --   --  34.0* 21.9* 24.2*   LATICACIDVEN  --  1.2 1.5 1.6  --   --      Liver Function Tests: Recent Labs  Lab 09/03/20 1400  AST 22  ALT 15  ALKPHOS 75  BILITOT 1.1  PROT 6.8  ALBUMIN 2.9*    No results for input(s): LIPASE, AMYLASE in the last 168 hours. No results for input(s): AMMONIA in the last 168 hours.  ABG    Component Value Date/Time   PHART 7.46 (H) 09/04/2020 0500   PCO2ART 27 (L) 09/04/2020 0500   PO2ART 81 (L) 09/04/2020 0500   HCO3 19.2 (L) 09/04/2020 0500   ACIDBASEDEF 3.3 (H) 09/04/2020 0500   O2SAT 96.5 09/04/2020 0500     Coagulation Profile: Recent Labs  Lab 09/03/20 1400 09/04/20 1137 09/05/20 0529  INR 5.1* 8.0* 6.4*     Cardiac Enzymes: No results for input(s): CKTOTAL, CKMB, CKMBINDEX, TROPONINI in the last 168 hours.  HbA1C: Hgb A1c MFr Bld  Date/Time Value Ref Range Status  09/03/2020 08:14 PM 6.1 (H) 4.8 - 5.6 % Final    Comment:    (NOTE) Pre diabetes:          5.7%-6.4%  Diabetes:              >6.4%  Glycemic control for   <7.0% adults with diabetes     CBG: Recent Labs  Lab 09/04/20 0051 09/04/20 0712  GLUCAP 167* 130*     Allergies Allergies  Allergen Reactions   Shellfish Allergy Anaphylaxis   Banana     Other reaction(s): Other (See Comments) Burning in the mouth   Garlic     Other reaction(s): Other (See Comments) Pt states arms freeze up and can't talk but no SOB   Isosorbide Nitrate Itching    Blurred vision   Fish-Derived Products Rash         Level 3 follow-up (35 minutes)  C. Derrill Kay, MD Advanced Bronchoscopy P[facility]    *This note was dictated using voice recognition software/Dragon.  Despite best efforts to proofread, errors can occur which can change the meaning.  Any change was purely unintentional.

## 2020-09-05 NOTE — Consult Note (Signed)
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 Pharmacy consulted for electrolyte mgmt during ICU admission.   Goal of Therapy:  Lytes WNL  Plan:  No replacement needed today.  F/u with BMP with AM labs.   Oswald Hillock ,PharmD Clinical Pharmacist 09/05/2020 8:43 AM

## 2020-09-05 NOTE — Anesthesia Postprocedure Evaluation (Signed)
Anesthesia Post Note  Patient: Theresa Phelps  Procedure(s) Performed: INCISION AND DRAINAGE ABSCESS-Perianal (Right)  Patient location during evaluation: ICU Anesthesia Type: General Level of consciousness: awake and awake and alert Pain management: pain level controlled Vital Signs Assessment: post-procedure vital signs reviewed and stable Respiratory status: spontaneous breathing Cardiovascular status: stable Postop Assessment: no apparent nausea or vomiting Anesthetic complications: no   No notable events documented.   Last Vitals:  Vitals:   09/05/20 0500 09/05/20 0600  BP: (!) 149/66 136/65  Pulse: (!) 37 (!) 38  Resp: 16 16  Temp:    SpO2: 98% 96%    Last Pain:  Vitals:   09/05/20 0400  TempSrc:   PainSc: Asleep                 Arita Miss

## 2020-09-06 DIAGNOSIS — K610 Anal abscess: Secondary | ICD-10-CM

## 2020-09-06 DIAGNOSIS — N179 Acute kidney failure, unspecified: Secondary | ICD-10-CM

## 2020-09-06 LAB — CBC WITH DIFFERENTIAL/PLATELET
Abs Immature Granulocytes: 0.13 10*3/uL — ABNORMAL HIGH (ref 0.00–0.07)
Basophils Absolute: 0 10*3/uL (ref 0.0–0.1)
Basophils Relative: 0 %
Eosinophils Absolute: 0.1 10*3/uL (ref 0.0–0.5)
Eosinophils Relative: 1 %
HCT: 31.4 % — ABNORMAL LOW (ref 36.0–46.0)
Hemoglobin: 10.4 g/dL — ABNORMAL LOW (ref 12.0–15.0)
Immature Granulocytes: 1 %
Lymphocytes Relative: 10 %
Lymphs Abs: 2 10*3/uL (ref 0.7–4.0)
MCH: 29.2 pg (ref 26.0–34.0)
MCHC: 33.1 g/dL (ref 30.0–36.0)
MCV: 88.2 fL (ref 80.0–100.0)
Monocytes Absolute: 1 10*3/uL (ref 0.1–1.0)
Monocytes Relative: 5 %
Neutro Abs: 15.9 10*3/uL — ABNORMAL HIGH (ref 1.7–7.7)
Neutrophils Relative %: 83 %
Platelets: 211 10*3/uL (ref 150–400)
RBC: 3.56 MIL/uL — ABNORMAL LOW (ref 3.87–5.11)
RDW: 13.2 % (ref 11.5–15.5)
WBC: 19.1 10*3/uL — ABNORMAL HIGH (ref 4.0–10.5)
nRBC: 0 % (ref 0.0–0.2)

## 2020-09-06 LAB — BASIC METABOLIC PANEL
Anion gap: 6 (ref 5–15)
BUN: 13 mg/dL (ref 8–23)
CO2: 22 mmol/L (ref 22–32)
Calcium: 8.2 mg/dL — ABNORMAL LOW (ref 8.9–10.3)
Chloride: 109 mmol/L (ref 98–111)
Creatinine, Ser: 0.77 mg/dL (ref 0.44–1.00)
GFR, Estimated: >60 mL/min (ref >60)
Glucose, Bld: 203 mg/dL — ABNORMAL HIGH (ref 70–99)
Potassium: 3.9 mmol/L (ref 3.5–5.1)
Sodium: 137 mmol/L (ref 135–145)

## 2020-09-06 LAB — PROTIME-INR
INR: 4.5 (ref 0.8–1.2)
Prothrombin Time: 43 seconds — ABNORMAL HIGH (ref 11.4–15.2)

## 2020-09-06 LAB — CULTURE, BLOOD (ROUTINE X 2)

## 2020-09-06 LAB — VANCOMYCIN, TROUGH: Vancomycin Tr: 17 ug/mL (ref 15–20)

## 2020-09-06 LAB — MAGNESIUM: Magnesium: 2.4 mg/dL (ref 1.7–2.4)

## 2020-09-06 LAB — VANCOMYCIN, PEAK: Vancomycin Pk: 34 ug/mL (ref 30–40)

## 2020-09-06 LAB — PHOSPHORUS: Phosphorus: 1.9 mg/dL — ABNORMAL LOW (ref 2.5–4.6)

## 2020-09-06 MED ORDER — VANCOMYCIN HCL 750 MG/150ML IV SOLN
750.0000 mg | Freq: Two times a day (BID) | INTRAVENOUS | Status: DC
  Administered 2020-09-06 – 2020-09-07 (×2): 750 mg via INTRAVENOUS
  Filled 2020-09-06 (×3): qty 150

## 2020-09-06 MED ORDER — ATORVASTATIN CALCIUM 20 MG PO TABS
80.0000 mg | ORAL_TABLET | Freq: Every day | ORAL | Status: DC
  Administered 2020-09-06 – 2020-09-07 (×2): 80 mg via ORAL
  Filled 2020-09-06 (×2): qty 4

## 2020-09-06 MED ORDER — OXYCODONE HCL 5 MG PO TABS
5.0000 mg | ORAL_TABLET | ORAL | Status: DC | PRN
Start: 2020-09-06 — End: 2020-09-07

## 2020-09-06 MED ORDER — TRAMADOL HCL 50 MG PO TABS
50.0000 mg | ORAL_TABLET | Freq: Four times a day (QID) | ORAL | Status: DC | PRN
Start: 2020-09-06 — End: 2020-09-07

## 2020-09-06 MED ORDER — EZETIMIBE 10 MG PO TABS
10.0000 mg | ORAL_TABLET | Freq: Every day | ORAL | Status: DC
  Administered 2020-09-06 – 2020-09-07 (×2): 10 mg via ORAL
  Filled 2020-09-06 (×4): qty 1

## 2020-09-06 MED ORDER — WARFARIN - PHARMACIST DOSING INPATIENT: Freq: Every day | Status: DC

## 2020-09-06 MED ORDER — K PHOS MONO-SOD PHOS DI & MONO 155-852-130 MG PO TABS
500.0000 mg | ORAL_TABLET | ORAL | Status: AC
  Administered 2020-09-06 (×4): 500 mg via ORAL
  Filled 2020-09-06 (×4): qty 2

## 2020-09-06 NOTE — Progress Notes (Signed)
Stage  TAHJ PRALLE  [id number] DOB: [birth year] DOA: 09/03/2020 PCP: Patient, No Pcp Per (Inactive)    Brief Narrative:  60 year old with a history of GERD, asthma, HLD, and CAD status post CABG who presented to the ED with complaints of rectal pain and on exam was found to have a large left gluteal cellulitis with abscess and a fever to 103.  Significant Events:  9/1 admit via ER -CT pelvis without evidence of gluteal abscess 9/2 atrial fibrillation with RVR -amiodarone infusion initiated 9/2 I&D of left gluteal abscess 9/3 amiodarone discontinued -RVR resolved -transfer out of ICU  Consultants:  PCCM General Surgery  Code Status: FULL CODE  Antimicrobials:  Cefepime 9/1 > Vancomycin 9/1 >  DVT prophylaxis: Warfarin  Subjective: Resting comfortably in bed.  States she feels much better.  Is anxious to go home.  Denies chest pain fever chills nausea or vomiting.  Assessment & Plan:  Septic shock POA due to left perianal abscess Sepsis now resolved -off pressors w/ a stable blood pressure - status post I&D of abscess 9/2 - wound care per Gen Surgery - d/c home likely 9/5 or 9/6  Atrial fibrillation with acute RVR Encountered in the postoperative period -has spontaneously converted back to NSR  - TSH normal -   Hyperglycemia No prior history of DM -A1c 6.1 therefore not yet consistent with diagnosis of diabetes  Status post mitral valve replacement mechanical valve  PAD   Family Communication:  Status is: Inpatient  Remains inpatient appropriate because:Inpatient level of care appropriate due to severity of illness  Dispo: The patient is from: Home              Anticipated d/c is to: Home              Patient currently is not medically stable to d/c.   Difficult to place patient No  Objective: Blood pressure (!) 123/57, pulse (!) 43, temperature 98.2 F (36.8 C), temperature source Oral, resp. rate (!) 23, height '5\' 10"'$  (1.778 m), weight 91.4 kg, SpO2 97  %.  Intake/Output Summary (Last 24 hours) at 09/06/2020 1104 Last data filed at 09/06/2020 0900 Gross per 24 hour  Intake 1540 ml  Output 1750 ml  Net -210 ml   Filed Weights   09/04/20 0429 09/05/20 0500 09/06/20 0500  Weight: 91 kg 92 kg 91.4 kg    Examination: General: No acute respiratory distress Lungs: Clear to auscultation bilaterally without wheezes or crackles Cardiovascular: Regular rate and rhythm without murmur gallop or rub normal S1 and S2 Abdomen: Nontender, nondistended, soft, bowel sounds positive, no rebound, no ascites, no appreciable mass Extremities: No significant cyanosis, clubbing, or edema bilateral lower extremities  CBC: Recent Labs  Lab 09/03/20 1400 09/03/20 2014 09/04/20 0520 09/05/20 0529 09/06/20 0532  WBC 27.3*   < > 21.9* 24.2* 19.1*  NEUTROABS 22.4*  --   --  21.8* 15.9*  HGB 12.9   < > 12.5 11.5* 10.4*  HCT 37.6   < > 36.7 34.0* 31.4*  MCV 85.5   < > 87.6 89.5 88.2  PLT 222   < > 192 218 211   < > = values in this interval not displayed.   Basic Metabolic Panel: Recent Labs  Lab 09/04/20 0520 09/05/20 0529 09/06/20 0532  NA 134* 134* 137  K 3.6 4.5 3.9  CL 108 106 109  CO2 21* 21* 22  GLUCOSE 132* 297* 203*  BUN '14 13 13  '$ CREATININE 0.83 0.87 0.77  CALCIUM 7.6* 8.2* 8.2*  MG 1.9 2.5* 2.4  PHOS 1.9* 2.8 1.9*   GFR: Estimated Creatinine Clearance: 90.6 mL/min (by C-G formula based on SCr of 0.77 mg/dL).  Liver Function Tests: Recent Labs  Lab 09/03/20 1400  AST 22  ALT 15  ALKPHOS 75  BILITOT 1.1  PROT 6.8  ALBUMIN 2.9*   Coagulation Profile: Recent Labs  Lab 09/03/20 1400 09/04/20 1137 09/05/20 0529 09/06/20 0532  INR 5.1* 8.0* 6.4* 4.5*     HbA1C: Hgb A1c MFr Bld  Date/Time Value Ref Range Status  09/03/2020 08:14 PM 6.1 (H) 4.8 - 5.6 % Final    Comment:    (NOTE) Pre diabetes:          5.7%-6.4%  Diabetes:              >6.4%  Glycemic control for   <7.0% adults with diabetes     CBG: Recent  Labs  Lab 09/04/20 0051 09/04/20 0712  GLUCAP 167* 130*    Recent Results (from the past 240 hour(s))  Resp Panel by RT-PCR (Flu A&B, Covid) Nasopharyngeal Swab     Status: None   Collection Time: 09/03/20  2:10 PM   Specimen: Nasopharyngeal Swab; Nasopharyngeal(NP) swabs in vial transport medium  Result Value Ref Range Status   SARS Coronavirus 2 by RT PCR NEGATIVE NEGATIVE Final    Comment: (NOTE) SARS-CoV-2 target nucleic acids are NOT DETECTED.  The SARS-CoV-2 RNA is generally detectable in upper respiratory specimens during the acute phase of infection. The lowest concentration of SARS-CoV-2 viral copies this assay can detect is 138 copies/mL. A negative result does not preclude SARS-Cov-2 infection and should not be used as the sole basis for treatment or other patient management decisions. A negative result may occur with  improper specimen collection/handling, submission of specimen other than nasopharyngeal swab, presence of viral mutation(s) within the areas targeted by this assay, and inadequate number of viral copies(<138 copies/mL). A negative result must be combined with clinical observations, patient history, and epidemiological information. The expected result is Negative.  Fact Sheet for Patients:  EntrepreneurPulse.com.au  Fact Sheet for Healthcare Providers:  IncredibleEmployment.be  This test is no t yet approved or cleared by the [place] FDA and  has been authorized for detection and/or diagnosis of SARS-CoV-2 by FDA under an Emergency Use Authorization (EUA). This EUA will remain  in effect (meaning this test can be used) for the duration of the COVID-19 declaration under Section 564(b)(1) of the Act, 21 U.S.C.section 360bbb-3(b)(1), unless the authorization is terminated  or revoked sooner.       Influenza A by PCR NEGATIVE NEGATIVE Final   Influenza B by PCR NEGATIVE NEGATIVE Final    Comment: (NOTE) The  Xpert Xpress SARS-CoV-2/FLU/RSV plus assay is intended as an aid in the diagnosis of influenza from Nasopharyngeal swab specimens and should not be used as a sole basis for treatment. Nasal washings and aspirates are unacceptable for Xpert Xpress SARS-CoV-2/FLU/RSV testing.  Fact Sheet for Patients: EntrepreneurPulse.com.au  Fact Sheet for Healthcare Providers: IncredibleEmployment.be  This test is not yet approved or cleared by the [place] FDA and has been authorized for detection and/or diagnosis of SARS-CoV-2 by FDA under an Emergency Use Authorization (EUA). This EUA will remain in effect (meaning this test can be used) for the duration of the COVID-19 declaration under Section 564(b)(1) of the Act, 21 U.S.C. section 360bbb-3(b)(1), unless the authorization is terminated or revoked.  Performed at [facility], [address] [place], [place] [address]   Blood Culture (routine x 2)     Status: Abnormal   Collection Time: 09/03/20  2:10 PM   Specimen: BLOOD  Result Value Ref Range Status   Specimen Description   Final    BLOOD RFA Performed at [facility], [address], [place], [place] [address]    Special Requests   Final    BOTTLES DRAWN AEROBIC AND ANAEROBIC BCAV Performed at [facility], [address] [place], [place] [address]    Culture  Setup Time   Final    GRAM POSITIVE COCCI IN CLUSTERS AEROBIC BOTTLE ONLY Organism ID to follow CRITICAL RESULT CALLED TO, READ BACK BY AND VERIFIED WITH: [facility] MITCHELL 09/04/20 1115 JGF Performed at [facility], [address] [place], [place] [address]    Culture (A)  Final    STAPHYLOCOCCUS HOMINIS THE SIGNIFICANCE OF ISOLATING THIS ORGANISM FROM A SINGLE SET OF BLOOD CULTURES WHEN MULTIPLE SETS ARE DRAWN IS UNCERTAIN. PLEASE NOTIFY THE MICROBIOLOGY DEPARTMENT WITHIN ONE WEEK IF SPECIATION AND SENSITIVITIES ARE REQUIRED. Performed at  [facility], [place] [address] [place], [place] [address]    Report Status 09/06/2020 FINAL  Final  Blood Culture ID Panel (Reflexed)     Status: Abnormal   Collection Time: 09/03/20  2:10 PM  Result Value Ref Range Status   Enterococcus faecalis NOT DETECTED NOT DETECTED Final   Enterococcus Faecium NOT DETECTED NOT DETECTED Final   Listeria monocytogenes NOT DETECTED NOT DETECTED Final   Staphylococcus species DETECTED (A) NOT DETECTED Final    Comment: CRITICAL RESULT CALLED TO, READ BACK BY AND VERIFIED WITH: SUSAN WATSON 09/04/20 1219 JGF    Staphylococcus aureus (BCID) NOT DETECTED NOT DETECTED Final   Staphylococcus epidermidis NOT DETECTED NOT DETECTED Final   Staphylococcus lugdunensis NOT DETECTED NOT DETECTED Final   Streptococcus species NOT DETECTED NOT DETECTED Final   Streptococcus agalactiae NOT DETECTED NOT DETECTED Final   Streptococcus pneumoniae NOT DETECTED NOT DETECTED Final   Streptococcus pyogenes NOT DETECTED NOT DETECTED Final   A.calcoaceticus-baumannii NOT DETECTED NOT DETECTED Final   Bacteroides fragilis NOT DETECTED NOT DETECTED Final   Enterobacterales NOT DETECTED NOT DETECTED Final   Enterobacter cloacae complex NOT DETECTED NOT DETECTED Final   Escherichia coli NOT DETECTED NOT DETECTED Final   Klebsiella aerogenes NOT DETECTED NOT DETECTED Final   Klebsiella oxytoca NOT DETECTED NOT DETECTED Final   Klebsiella pneumoniae NOT DETECTED NOT DETECTED Final   Proteus species NOT DETECTED NOT DETECTED Final   Salmonella species NOT DETECTED NOT DETECTED Final   Serratia marcescens NOT DETECTED NOT DETECTED Final   Haemophilus influenzae NOT DETECTED NOT DETECTED Final   Neisseria meningitidis NOT DETECTED NOT DETECTED Final   Pseudomonas aeruginosa NOT DETECTED NOT DETECTED Final   Stenotrophomonas maltophilia NOT DETECTED NOT DETECTED Final   Candida albicans NOT DETECTED NOT DETECTED Final   Candida auris NOT DETECTED NOT DETECTED Final    Candida glabrata NOT DETECTED NOT DETECTED Final   Candida krusei NOT DETECTED NOT DETECTED Final   Candida parapsilosis NOT DETECTED NOT DETECTED Final   Candida tropicalis NOT DETECTED NOT DETECTED Final   Cryptococcus neoformans/gattii NOT DETECTED NOT DETECTED Final    Comment: Performed at [facility], [address] [place], [place] [address]  Blood Culture (routine x 2)     Status: None (Preliminary result)   Collection Time: 09/03/20  2:12 PM   Specimen: BLOOD  Result Value Ref  Range Status   Specimen Description BLOOD LFA  Final   Special Requests   Final    BOTTLES DRAWN AEROBIC AND ANAEROBIC Blood Culture results may not be optimal due to an excessive volume of blood received in culture bottles   Culture   Final    NO GROWTH 2 DAYS Performed at [facility], [address] [place], [place] [address]    Report Status PENDING  Incomplete  Urine Culture     Status: Abnormal   Collection Time: 09/03/20  8:14 PM   Specimen: Urine, Random  Result Value Ref Range Status   Specimen Description   Final    URINE, RANDOM Performed at [facility], [address], [place], [place] [address]    Special Requests   Final    NONE Performed at [facility], [address], [place], [place] [address]    Culture (A)  Final    <10,000 COLONIES/mL INSIGNIFICANT GROWTH Performed at [facility], [place] [address] [place], [place] [address]    Report Status 09/05/2020 FINAL  Final  MRSA Next Gen by PCR, Nasal     Status: None   Collection Time: 09/04/20 12:56 AM   Specimen: Nasal Mucosa; Nasal Swab  Result Value Ref Range Status   MRSA by PCR Next Gen NOT DETECTED NOT DETECTED Final    Comment: (NOTE) The GeneXpert MRSA Assay (FDA approved for NASAL specimens only), is one component of a comprehensive MRSA colonization surveillance program. It is not intended to diagnose MRSA infection nor to guide or monitor treatment for MRSA  infections. Test performance is not FDA approved in patients less than 38 years old. Performed at [facility], [address] [place], [place] [address]   Aerobic/Anaerobic Culture w Gram Stain (surgical/deep wound)     Status: None (Preliminary result)   Collection Time: 09/04/20  2:10 PM   Specimen: Abscess  Result Value Ref Range Status   Specimen Description   Final    ABSCESS Performed at [facility], [address] [place], [place] [address]    Special Requests   Final    NONE Performed at [facility], [address] [place], [place] [address]    Gram Stain   Final    FEW WBC PRESENT, PREDOMINANTLY PMN RARE GRAM POSITIVE COCCI    Culture   Final    CULTURE REINCUBATED FOR BETTER GROWTH Performed at [facility], [place] [address], [place], [place] [address]    Report Status PENDING  Incomplete     Scheduled Meds:  Chlorhexidine Gluconate Cloth  6 each Topical Q0600   phosphorus  500 mg Oral Q4H   sodium chloride flush  3 mL Intravenous Q12H   Continuous Infusions:  sodium chloride     sodium chloride 100 mL/hr at 09/05/20 1900   ceFEPime (MAXIPIME) IV Stopped (09/06/20 [id number])   vancomycin 1,000 mg (09/06/20 0852)     LOS: 3 days   Cherene Altes, MD [facility] Office  [phone] Pager - Text Page per Shea Evans  If 7PM-7AM, please contact night-coverage per Amion 09/06/2020, 11:04 AM

## 2020-09-06 NOTE — Consult Note (Signed)
Pharmacy Antibiotic Note  Theresa Phelps is a 60 y.o. female admitted on 09/03/2020 with left gluteal cellulitis. Pharmacy has been consulted for vancomycin and cefepime dosing. GPC 1 of 4 bottles. BCID detects staphylococcus species (not S aureus, epidermidis).  Plan: Continue cefepime 2 g IV Q8H Vank pk 34; Vank tr 17: Change vancomycin from 1000 mg IV q12 hours to 750 mg IV q12h  Estimated AUC 461/Cmin 12.8 Monitor renal function for dose adjustments Follow up cultures   Height: '5\' 10"'$  (177.8 cm) Weight: 91.4 kg (201 lb 8 oz) IBW/kg (Calculated) : 68.5  Temp (24hrs), Avg:97.7 F (36.5 C), Min:96.7 F (35.9 C), Max:98.4 F (36.9 C)  Recent Labs  Lab 09/03/20 1400 09/03/20 1410 09/03/20 1643 09/03/20 2014 09/04/20 0520 09/05/20 0529 09/06/20 0532 09/06/20 1044 09/06/20 2042  WBC 27.3*  --   --  34.0* 21.9* 24.2* 19.1*  --   --   CREATININE 1.34*  --   --  1.10* 0.83 0.87 0.77  --   --   LATICACIDVEN  --  1.2 1.5 1.6  --   --   --   --   --   VANCOTROUGH  --   --   --   --   --   --   --   --  17  VANCOPEAK  --   --   --   --   --   --   --  34  --      Estimated Creatinine Clearance: 90.6 mL/min (by C-G formula based on SCr of 0.77 mg/dL).    Allergies  Allergen Reactions   Shellfish Allergy Anaphylaxis   Banana     Other reaction(s): Other (See Comments) Burning in the mouth   Garlic     Other reaction(s): Other (See Comments) Pt states arms freeze up and can't talk but no SOB   Isosorbide Nitrate Itching    Blurred vision   Fish-Derived Products Rash    Antimicrobials this admission: 9/1 cefepime >>  9/1 vancomycin >>  9/1 metronidazole x1   Dose adjustments this admission: Cefepime and vancomycin adjusted 9/02 (see plan for details) Vancomycin 9/4 based on levels   Microbiology results: 9/1 BCx: GPC 1 of 4 bottles. BCID detects staphylococcus species (not S aureus, epidermidis) 9/1 UCx: insignificant growth  Thank you for allowing pharmacy to be  a part of this patient's care.  Sherilyn Banker, PharmD Clinical Pharmacist   09/06/2020 9:24 PM

## 2020-09-06 NOTE — Progress Notes (Signed)
ANTICOAGULATION CONSULT NOTE - Initial Consult  Pharmacy Consult for warfarin Indication: mechanical valve  Allergies  Allergen Reactions   Shellfish Allergy Anaphylaxis   Banana     Other reaction(s): Other (See Comments) Burning in the mouth   Garlic     Other reaction(s): Other (See Comments) Pt states arms freeze up and can't talk but no SOB   Isosorbide Nitrate Itching    Blurred vision   Fish-Derived Products Rash    Patient Measurements: Height: '5\' 10"'$  (177.8 cm) Weight: 91.4 kg (201 lb 8 oz) IBW/kg (Calculated) : 68.5  Vital Signs: Temp: 97.9 F (36.6 C) (09/04 1500) Temp Source: Oral (09/04 1500) BP: 134/61 (09/04 1700) Pulse Rate: 46 (09/04 1700)  Labs: Recent Labs    09/04/20 0520 09/04/20 1137 09/05/20 0529 09/06/20 0532  HGB 12.5  --  11.5* 10.4*  HCT 36.7  --  34.0* 31.4*  PLT 192  --  218 211  LABPROT  --  67.0* 55.9* 43.0*  INR  --  8.0* 6.4* 4.5*  CREATININE 0.83  --  0.87 0.77    Estimated Creatinine Clearance: 90.6 mL/min (by C-G formula based on SCr of 0.77 mg/dL).   Medical History: Past Medical History:  Diagnosis Date   Acid reflux    Arthritis    rheumatoid arthritis   Asthma    High cholesterol    Peripheral vascular disease (HCC)     Medications:  Warfarin 6 mg at bedtime - last dose was 8/30   Assessment: 60 year old with a history of GERD, asthma, HLD, and CAD status post CABG and mechanical MVR who presented to the ED with complaints of rectal pain. Pharmacy has been consulted for warfarin dosing.   9/4 INR 4.5 - supratherapeutic   Goal of Therapy:  INR 2.5 - 3.5  Monitor platelets by anticoagulation protocol: Yes   Plan:  INR supratherapeutic. Will hold warfarin for tonight Recheck INR with AM labs   Ayzia Day O Shannelle Alguire 09/06/2020,5:36 PM

## 2020-09-06 NOTE — Consult Note (Signed)
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 Pharmacy consulted for electrolyte mgmt during ICU admission.   Goal of Therapy:  Lytes WNL  Plan:  Will order Kphos 2 tab x 4.  F/u with BMP with AM labs.   Oswald Hillock ,PharmD Clinical Pharmacist 09/06/2020 9:32 AM

## 2020-09-07 LAB — CBC
HCT: 34.2 % — ABNORMAL LOW (ref 36.0–46.0)
Hemoglobin: 11.4 g/dL — ABNORMAL LOW (ref 12.0–15.0)
MCH: 28.6 pg (ref 26.0–34.0)
MCHC: 33.3 g/dL (ref 30.0–36.0)
MCV: 85.7 fL (ref 80.0–100.0)
Platelets: 260 10*3/uL (ref 150–400)
RBC: 3.99 MIL/uL (ref 3.87–5.11)
RDW: 13.1 % (ref 11.5–15.5)
WBC: 12.6 10*3/uL — ABNORMAL HIGH (ref 4.0–10.5)
nRBC: 0 % (ref 0.0–0.2)

## 2020-09-07 LAB — BASIC METABOLIC PANEL
Anion gap: 3 — ABNORMAL LOW (ref 5–15)
BUN: 10 mg/dL (ref 8–23)
CO2: 26 mmol/L (ref 22–32)
Calcium: 8.1 mg/dL — ABNORMAL LOW (ref 8.9–10.3)
Chloride: 109 mmol/L (ref 98–111)
Creatinine, Ser: 0.6 mg/dL (ref 0.44–1.00)
GFR, Estimated: >60 mL/min (ref >60)
Glucose, Bld: 119 mg/dL — ABNORMAL HIGH (ref 70–99)
Potassium: 3.3 mmol/L — ABNORMAL LOW (ref 3.5–5.1)
Sodium: 138 mmol/L (ref 135–145)

## 2020-09-07 LAB — PROTIME-INR
INR: 2.8 — ABNORMAL HIGH (ref 0.8–1.2)
Prothrombin Time: 29.1 seconds — ABNORMAL HIGH (ref 11.4–15.2)

## 2020-09-07 MED ORDER — DOXYCYCLINE HYCLATE 100 MG PO TABS: 100.0000 mg | ORAL_TABLET | Freq: Two times a day (BID) | ORAL | 0 refills | Status: AC

## 2020-09-07 MED ORDER — WARFARIN SODIUM 6 MG PO TABS
6.0000 mg | ORAL_TABLET | Freq: Once | ORAL | Status: DC
  Filled 2020-09-07: qty 1

## 2020-09-07 MED ORDER — DOXYCYCLINE HYCLATE 100 MG PO TABS
100.0000 mg | ORAL_TABLET | Freq: Two times a day (BID) | ORAL | Status: DC
  Administered 2020-09-07: 100 mg via ORAL
  Filled 2020-09-07: qty 1

## 2020-09-07 NOTE — Discharge Instructions (Signed)
Use of antibiotic may interact with your couadin dosing. Please make sure you have your INR checked in your usual clinic within the next 2-3 days to assure it is in a safe range.   See your primary care doctor for a check of your medical issues in 7 days.

## 2020-09-07 NOTE — Discharge Summary (Signed)
DISCHARGE SUMMARY  Theresa Phelps  MR#: [id number]  DOB:05-12-1960  Date of Admission: 09/03/2020 Date of Discharge: 09/07/2020  Attending Physician:Tonna Palazzi [name], MD  Patient's QP:[id number], No Pcp Per (Inactive)  Consults: PCCM General Surgery  Disposition: Discharge home  Follow-up Appts:  Follow-up Information     Herbert Pun, MD. Schedule an appointment as soon as possible for a visit in 5 day(s).   Specialty: General Surgery Contact information: [place] [address] [phone]         [facility] Follow up.   Why: Use of antibiotic may interact with your couadin dosing. Please make sure you have your INR checked in your usual clinic within the next 2-3 days to assure it is in a safe range.                Tests Needing Follow-up: -assess wound -assess HR and rhythm  -monitor INR closely w/ use of doxycycline + warfarin   Discharge Diagnoses: Septic shock POA due to left perianal MRSA abscess Acute transient newly appreciated Atrial fibrillation with acute RVR Hyperglycemia Hypokalemia  Status post mitral valve replacement mechanical valve  Initial presentation: 60 year old with a history of GERD, asthma, HLD, and CAD status post CABG who presented to the ED with complaints of rectal pain and on exam was found to have a large left gluteal cellulitis with abscess and a fever to 103.  Hospital Course: 9/1 admit via ER -CT pelvis without evidence of gluteal abscess 9/2 atrial fibrillation with RVR -amiodarone infusion initiated 9/2 I&D of left gluteal abscess 9/3 amiodarone discontinued -RVR resolved -transfer out of ICU  Septic shock POA due to left perianal MRSA abscess Sepsis resolved w/ ICU care - weaned off pressors and maintained stable blood pressure - status post I&D of abscess 9/2 - wound care per Gen Surgery w/ RN teaching pt/family proper wound care prior to d/c - clinically stable  at d/c - to complete 14 full days of abx tx (course extended due to presence of mechanical valve) - of note pt was NOT felt to have a true bacteremia - her 1 of 2 + blood cultures was a species most c/w a contaminant    Acute newly appreciated Atrial fibrillation with acute RVR Encountered in the postoperative period - spontaneously converted back to NSR  - TSH normal - will need to be monitored in outpt setting    Hyperglycemia No prior history of DM - A1c 6.1 therefore not yet consistent with diagnosis of diabetes   Hypokalemia  Due to poor intake -supplemented   Status post mitral valve replacement mechanical valve Reamins on warfarin w/ INR supratherapeutic th/o most of hospital stay      Allergies as of 09/07/2020       Reactions   Shellfish Allergy Anaphylaxis   Banana    Other reaction(s): Other (See Comments) Burning in the mouth   Garlic    Other reaction(s): Other (See Comments) Pt states arms freeze up and can't talk but no SOB   Isosorbide Nitrate Itching   Blurred vision   Fish-derived Products Rash        Medication List     TAKE these medications    acetaminophen 500 MG tablet Commonly known as: TYLENOL Take 2 tablets (1,000 mg total) by mouth every 6 (six) hours as needed.   atorvastatin 80 MG tablet Commonly known as: LIPITOR Take 80 mg by mouth daily.   doxycycline 100 MG tablet Commonly known  as: VIBRA-TABS Take 1 tablet (100 mg total) by mouth every 12 (twelve) hours for 5 days.   EPINEPHrine 0.3 mg/0.3 mL Soaj injection Commonly known as: EPI-PEN Inject into the muscle as directed.   ezetimibe 10 MG tablet Commonly known as: ZETIA Take 10 mg by mouth daily.   furosemide 40 MG tablet Commonly known as: LASIX furosemide 40 mg tablet   nitroGLYCERIN 0.4 MG SL tablet Commonly known as: NITROSTAT Place under the tongue.   warfarin 6 MG tablet Commonly known as: COUMADIN Take 6 mg by mouth at bedtime.        Day of Discharge BP  (!) 143/58 (BP Location: Left Arm)   Pulse (!) 45   Temp 98.5 F (36.9 C) (Oral)   Resp 18   Ht '5\' 10"'$  (1.778 m)   Wt 91.4 kg   SpO2 90%   BMI 28.91 kg/m   Physical Exam: General: No acute respiratory distress Lungs: Clear to auscultation bilaterally without wheezes or crackles Cardiovascular: Regular rate and rhythm without murmur gallop or rub normal S1 and S2 Abdomen: Nontender, nondistended, soft, bowel sounds positive, no rebound, no ascites, no appreciable mass Extremities: No significant cyanosis, clubbing, or edema bilateral lower extremities  Basic Metabolic Panel: Recent Labs  Lab 09/03/20 1400 09/03/20 2014 09/04/20 0520 09/05/20 0529 09/06/20 0532 09/07/20 0450  NA 126*  --  134* 134* 137 138  K 3.3*  --  3.6 4.5 3.9 3.3*  CL 97*  --  108 106 109 109  CO2 21*  --  21* 21* 22 26  GLUCOSE 208*  --  132* 297* 203* 119*  BUN 22  --  '14 13 13 10  '$ CREATININE 1.34* 1.10* 0.83 0.87 0.77 0.60  CALCIUM 7.8*  --  7.6* 8.2* 8.2* 8.1*  MG  --   --  1.9 2.5* 2.4  --   PHOS  --   --  1.9* 2.8 1.9*  --     Liver Function Tests: Recent Labs  Lab 09/03/20 1400  AST 22  ALT 15  ALKPHOS 75  BILITOT 1.1  PROT 6.8  ALBUMIN 2.9*   Coags: Recent Labs  Lab 09/03/20 1400 09/04/20 1137 09/05/20 0529 09/06/20 0532 09/07/20 0450  INR 5.1* 8.0* 6.4* 4.5* 2.8*    CBC: Recent Labs  Lab 09/03/20 1400 09/03/20 2014 09/04/20 0520 09/05/20 0529 09/06/20 0532 09/07/20 0450  WBC 27.3* 34.0* 21.9* 24.2* 19.1* 12.6*  NEUTROABS 22.4*  --   --  21.8* 15.9*  --   HGB 12.9 12.3 12.5 11.5* 10.4* 11.4*  HCT 37.6 36.6 36.7 34.0* 31.4* 34.2*  MCV 85.5 87.4 87.6 89.5 88.2 85.7  PLT 222 209 192 218 211 260    CBG: Recent Labs  Lab 09/04/20 0051 09/04/20 0712  GLUCAP 167* 130*    Recent Results (from the past 240 hour(s))  Resp Panel by RT-PCR (Flu A&B, Covid) Nasopharyngeal Swab     Status: None   Collection Time: 09/03/20  2:10 PM   Specimen: Nasopharyngeal Swab;  Nasopharyngeal(NP) swabs in vial transport medium  Result Value Ref Range Status   SARS Coronavirus 2 by RT PCR NEGATIVE NEGATIVE Final    Comment: (NOTE) SARS-CoV-2 target nucleic acids are NOT DETECTED.  The SARS-CoV-2 RNA is generally detectable in upper respiratory specimens during the acute phase of infection. The lowest concentration of SARS-CoV-2 viral copies this assay can detect is 138 copies/mL. A negative result does not preclude SARS-Cov-2 infection and should not be used as the  sole basis for treatment or other patient management decisions. A negative result may occur with  improper specimen collection/handling, submission of specimen other than nasopharyngeal swab, presence of viral mutation(s) within the areas targeted by this assay, and inadequate number of viral copies(<138 copies/mL). A negative result must be combined with clinical observations, patient history, and epidemiological information. The expected result is Negative.  Fact Sheet for Patients:  EntrepreneurPulse.com.au  Fact Sheet for Healthcare Providers:  IncredibleEmployment.be  This test is no t yet approved or cleared by the [place] FDA and  has been authorized for detection and/or diagnosis of SARS-CoV-2 by FDA under an Emergency Use Authorization (EUA). This EUA will remain  in effect (meaning this test can be used) for the duration of the COVID-19 declaration under Section 564(b)(1) of the Act, 21 U.S.C.section 360bbb-3(b)(1), unless the authorization is terminated  or revoked sooner.       Influenza A by PCR NEGATIVE NEGATIVE Final   Influenza B by PCR NEGATIVE NEGATIVE Final    Comment: (NOTE) The Xpert Xpress SARS-CoV-2/FLU/RSV plus assay is intended as an aid in the diagnosis of influenza from Nasopharyngeal swab specimens and should not be used as a sole basis for treatment. Nasal washings and aspirates are unacceptable for Xpert Xpress  SARS-CoV-2/FLU/RSV testing.  Fact Sheet for Patients: EntrepreneurPulse.com.au  Fact Sheet for Healthcare Providers: IncredibleEmployment.be  This test is not yet approved or cleared by the [place] FDA and has been authorized for detection and/or diagnosis of SARS-CoV-2 by FDA under an Emergency Use Authorization (EUA). This EUA will remain in effect (meaning this test can be used) for the duration of the COVID-19 declaration under Section 564(b)(1) of the Act, 21 U.S.C. section 360bbb-3(b)(1), unless the authorization is terminated or revoked.  Performed at [facility], [address], [place], [place] [address]   Blood Culture (routine x 2)     Status: Abnormal   Collection Time: 09/03/20  2:10 PM   Specimen: BLOOD  Result Value Ref Range Status   Specimen Description   Final    BLOOD RFA Performed at [facility], [address] [place], [place] [address]    Special Requests   Final    BOTTLES DRAWN AEROBIC AND ANAEROBIC BCAV Performed at [facility], [address], [place], [place] [address]    Culture  Setup Time   Final    GRAM POSITIVE COCCI IN CLUSTERS AEROBIC BOTTLE ONLY Organism ID to follow CRITICAL RESULT CALLED TO, READ BACK BY AND VERIFIED WITH: [facility] MITCHELL 09/04/20 1115 JGF Performed at [facility], [address] [place], [place] [address]    Culture (A)  Final    STAPHYLOCOCCUS HOMINIS THE SIGNIFICANCE OF ISOLATING THIS ORGANISM FROM A SINGLE SET OF BLOOD CULTURES WHEN MULTIPLE SETS ARE DRAWN IS UNCERTAIN. PLEASE NOTIFY THE MICROBIOLOGY DEPARTMENT WITHIN ONE WEEK IF SPECIATION AND SENSITIVITIES ARE REQUIRED. Performed at [facility], [place] [address] [place], [place] [address]    Report Status 09/06/2020 FINAL  Final  Blood Culture ID Panel (Reflexed)     Status: Abnormal   Collection Time: 09/03/20  2:10 PM  Result Value Ref Range Status   Enterococcus  faecalis NOT DETECTED NOT DETECTED Final   Enterococcus Faecium NOT DETECTED NOT DETECTED Final   Listeria monocytogenes NOT DETECTED NOT DETECTED Final   Staphylococcus species DETECTED (A) NOT DETECTED Final    Comment: CRITICAL RESULT CALLED TO, READ BACK BY AND VERIFIED WITH: SUSAN WATSON 09/04/20 1219 JGF  Staphylococcus aureus (BCID) NOT DETECTED NOT DETECTED Final   Staphylococcus epidermidis NOT DETECTED NOT DETECTED Final   Staphylococcus lugdunensis NOT DETECTED NOT DETECTED Final   Streptococcus species NOT DETECTED NOT DETECTED Final   Streptococcus agalactiae NOT DETECTED NOT DETECTED Final   Streptococcus pneumoniae NOT DETECTED NOT DETECTED Final   Streptococcus pyogenes NOT DETECTED NOT DETECTED Final   A.calcoaceticus-baumannii NOT DETECTED NOT DETECTED Final   Bacteroides fragilis NOT DETECTED NOT DETECTED Final   Enterobacterales NOT DETECTED NOT DETECTED Final   Enterobacter cloacae complex NOT DETECTED NOT DETECTED Final   Escherichia coli NOT DETECTED NOT DETECTED Final   Klebsiella aerogenes NOT DETECTED NOT DETECTED Final   Klebsiella oxytoca NOT DETECTED NOT DETECTED Final   Klebsiella pneumoniae NOT DETECTED NOT DETECTED Final   Proteus species NOT DETECTED NOT DETECTED Final   Salmonella species NOT DETECTED NOT DETECTED Final   Serratia marcescens NOT DETECTED NOT DETECTED Final   Haemophilus influenzae NOT DETECTED NOT DETECTED Final   Neisseria meningitidis NOT DETECTED NOT DETECTED Final   Pseudomonas aeruginosa NOT DETECTED NOT DETECTED Final   Stenotrophomonas maltophilia NOT DETECTED NOT DETECTED Final   Candida albicans NOT DETECTED NOT DETECTED Final   Candida auris NOT DETECTED NOT DETECTED Final   Candida glabrata NOT DETECTED NOT DETECTED Final   Candida krusei NOT DETECTED NOT DETECTED Final   Candida parapsilosis NOT DETECTED NOT DETECTED Final   Candida tropicalis NOT DETECTED NOT DETECTED Final   Cryptococcus neoformans/gattii NOT DETECTED  NOT DETECTED Final    Comment: Performed at [facility], [address] [place], [place] [address]  Blood Culture (routine x 2)     Status: None (Preliminary result)   Collection Time: 09/03/20  2:12 PM   Specimen: BLOOD  Result Value Ref Range Status   Specimen Description BLOOD LFA  Final   Special Requests   Final    BOTTLES DRAWN AEROBIC AND ANAEROBIC Blood Culture results may not be optimal due to an excessive volume of blood received in culture bottles   Culture   Final    NO GROWTH 4 DAYS Performed at [facility], [address] [place], [place] [address]    Report Status PENDING  Incomplete  Urine Culture     Status: Abnormal   Collection Time: 09/03/20  8:14 PM   Specimen: Urine, Random  Result Value Ref Range Status   Specimen Description   Final    URINE, RANDOM Performed at [facility], [address] [place], [place] [address]    Special Requests   Final    NONE Performed at [facility], [address], [place], [place] [address]    Culture (A)  Final    <10,000 COLONIES/mL INSIGNIFICANT GROWTH Performed at [facility], [address] [place], [place] [address]    Report Status 09/05/2020 FINAL  Final  MRSA Next Gen by PCR, Nasal     Status: None   Collection Time: 09/04/20 12:56 AM   Specimen: Nasal Mucosa; Nasal Swab  Result Value Ref Range Status   MRSA by PCR Next Gen NOT DETECTED NOT DETECTED Final    Comment: (NOTE) The GeneXpert MRSA Assay (FDA approved for NASAL specimens only), is one component of a comprehensive MRSA colonization surveillance program. It is not intended to diagnose MRSA infection nor to guide or monitor treatment for MRSA infections. Test performance is not FDA approved in patients less than 82 years old. Performed at [facility], [id number] [place], [place] [address]   Aerobic/Anaerobic Culture w Gram Stain (surgical/deep wound)     Status: None (Preliminary  result)   Collection Time: 09/04/20  2:10 PM   Specimen: Abscess  Result Value Ref Range Status   Specimen Description   Final    ABSCESS Performed at [facility], [address], [place], [place] [address]    Special Requests   Final    NONE Performed at [facility], [address] [place], [place] [address]    Gram Stain   Final    FEW WBC PRESENT, PREDOMINANTLY PMN RARE GRAM POSITIVE COCCI Performed at [facility], [place] [address], [place], [place] [address]    Culture   Final    FEW METHICILLIN RESISTANT STAPHYLOCOCCUS AUREUS NO ANAEROBES ISOLATED; CULTURE IN PROGRESS FOR 5 DAYS    Report Status PENDING  Incomplete   Organism ID, Bacteria METHICILLIN RESISTANT STAPHYLOCOCCUS AUREUS  Final      Susceptibility   Methicillin resistant staphylococcus aureus - MIC*    CIPROFLOXACIN >=8 RESISTANT Resistant     ERYTHROMYCIN >=8 RESISTANT Resistant     GENTAMICIN <=0.5 SENSITIVE Sensitive     OXACILLIN >=4 RESISTANT Resistant     TETRACYCLINE <=1 SENSITIVE Sensitive     VANCOMYCIN 1 SENSITIVE Sensitive     TRIMETH/SULFA <=10 SENSITIVE Sensitive     CLINDAMYCIN <=0.25 SENSITIVE Sensitive     RIFAMPIN <=0.5 SENSITIVE Sensitive     Inducible Clindamycin NEGATIVE Sensitive     * FEW METHICILLIN RESISTANT STAPHYLOCOCCUS AUREUS     Time spent in discharge (includes decision making & examination of pt): 35 minutes  09/07/2020, 1:43 PM   Cherene Altes, MD [facility] Office  [phone]

## 2020-09-07 NOTE — Progress Notes (Signed)
Dressing changed as per md's orders patient tol well, serosang drainage noted

## 2020-09-07 NOTE — Progress Notes (Signed)
[place] for warfarin Indication: mechanical valve  Allergies  Allergen Reactions   Shellfish Allergy Anaphylaxis   Banana     Other reaction(s): Other (See Comments) Burning in the mouth   Garlic     Other reaction(s): Other (See Comments) Pt states arms freeze up and can't talk but no SOB   Isosorbide Nitrate Itching    Blurred vision   Fish-Derived Products Rash    Patient Measurements: Height: '5\' 10"'$  (177.8 cm) Weight: 91.4 kg (201 lb 8 oz) IBW/kg (Calculated) : 68.5  Vital Signs: Temp: 98.5 F (36.9 C) (09/05 0734) Temp Source: Oral (09/05 0734) BP: 143/58 (09/05 0734) Pulse Rate: 45 (09/05 0734)  Labs: Recent Labs    09/05/20 0529 09/06/20 0532 09/07/20 0450  HGB 11.5* 10.4* 11.4*  HCT 34.0* 31.4* 34.2*  PLT 218 211 260  LABPROT 55.9* 43.0* 29.1*  INR 6.4* 4.5* 2.8*  CREATININE 0.87 0.77 0.60     Estimated Creatinine Clearance: 90.6 mL/min (by C-G formula based on SCr of 0.6 mg/dL).   Medical History: Past Medical History:  Diagnosis Date   Acid reflux    Arthritis    rheumatoid arthritis   Asthma    High cholesterol    Peripheral vascular disease (HCC)     Medications:  Warfarin 6 mg at bedtime - last dose was 8/30   Assessment: 60 year old with a history of GERD, asthma, HLD, and CAD status post CABG and mechanical MVR who presented to the ED with complaints of rectal pain. Pharmacy has been consulted for warfarin dosing.  -large left gluteal cellulitis with abscess   9/4 INR 4.5 - supratherapeutic -dose held 9/5 INR 2.8  DDI:abx (doxy), was on amiodarone drip 9/2-9/3  Goal of Therapy:  INR 2.5 - 3.5  Monitor platelets by anticoagulation protocol: Yes   Plan:  INR therapeutic. Will order warfarin 6 mg for tonight  (home dose) Recheck INR with AM labs   Ceci Taliaferro A 09/07/2020,12:43 PM

## 2020-09-08 LAB — CULTURE, BLOOD (ROUTINE X 2): Culture: NO GROWTH

## 2020-09-10 LAB — AEROBIC/ANAEROBIC CULTURE W GRAM STAIN (SURGICAL/DEEP WOUND)

## 2020-10-12 LAB — BLOOD GAS, ARTERIAL
Acid-base deficit: 3.3 mmol/L — ABNORMAL HIGH (ref 0.0–2.0)
Bicarbonate: 19.2 mmol/L — ABNORMAL LOW (ref 20.0–28.0)
O2 Saturation: 96.5 %
Patient temperature: 37
pCO2 arterial: 27 mmHg — ABNORMAL LOW (ref 32.0–48.0)
pH, Arterial: 7.46 — ABNORMAL HIGH (ref 7.350–7.450)
pO2, Arterial: 81 mmHg — ABNORMAL LOW (ref 83.0–108.0)

## 2020-10-24 ENCOUNTER — Ambulatory Visit
Admission: EM | Admit: 2020-10-24 | Discharge: 2020-10-24 | Disposition: A | Discharge status: Home / Self Care | Payer: Medicaid Other | Attending: Internal Medicine | Admitting: Internal Medicine

## 2020-10-24 ENCOUNTER — Other Ambulatory Visit: Payer: Self-pay

## 2020-10-24 ENCOUNTER — Encounter: Payer: Self-pay | Admitting: Emergency Medicine

## 2020-10-24 DIAGNOSIS — J069 Acute upper respiratory infection, unspecified: Secondary | ICD-10-CM | POA: Diagnosis not present

## 2020-10-24 DIAGNOSIS — Z20822 Contact with and (suspected) exposure to covid-19: Secondary | ICD-10-CM | POA: Insufficient documentation

## 2020-10-24 DIAGNOSIS — Z87891 Personal history of nicotine dependence: Secondary | ICD-10-CM | POA: Diagnosis not present

## 2020-10-24 LAB — RESP PANEL BY RT-PCR (FLU A&B, COVID) ARPGX2
Influenza A by PCR: NEGATIVE
Influenza B by PCR: NEGATIVE
SARS Coronavirus 2 by RT PCR: NEGATIVE

## 2020-10-24 MED ORDER — BENZONATATE 200 MG PO CAPS: 200.0000 mg | ORAL_CAPSULE | Freq: Two times a day (BID) | ORAL | 0 refills | Status: DC | PRN

## 2020-10-24 NOTE — ED Triage Notes (Signed)
Patient reports runny nose and chills that started on Wed.  Patient states that she was exposed to covid earlier this week.  Patient denies fevers.

## 2020-10-24 NOTE — ED Provider Notes (Signed)
[facility]    CSN: [id number] Arrival date & time: 10/24/20  1450      History   Chief Complaint Chief Complaint  Patient presents with   Nasal Congestion   Covid Exposure    HPI LISL SLINGERLAND is a 60 y.o. female who presents with chills, rhinitis x 4 days. Has been exposed to covid. Denies fever. Denies HA, body aches, fatigue or decreased appetite. Has had all the covid shots.     Past Medical History:  Diagnosis Date   Acid reflux    Arthritis    rheumatoid arthritis   Asthma    High cholesterol    Peripheral vascular disease ([place])     Patient Active Problem List   Diagnosis Date Noted   Perianal abscess 09/04/2020   Carotid artery stenosis, asymptomatic, right 05/27/2019   Pulmonary hypertension ([place]) 05/27/2019   Primary osteoarthritis of both knees 12/10/2018   Centrilobular emphysema ([place]) 11/28/2018   High risk medication use 11/23/2018   Rheumatoid arthritis, seropositive ([place]) 11/23/2018   Red blood cell antibody positive 01/19/2018   S/P insertion of iliac artery stent 01/16/2018   S/P mitral valve replacement with metallic valve 82/70/7867   Warfarin anticoagulation 12/18/2017   AV block, 1st degree 12/17/2017   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/14/2017   Coronary artery disease involving native coronary artery of native heart 11/10/2017   Mitral valve insufficiency 11/07/2017   Bilateral carotid artery stenosis 06/01/2017   Hyperlipidemia 06/01/2017   Prediabetes 05/31/2017   Tobacco use disorder 08/23/2016   Bilateral hand pain 08/23/2016   PAD (peripheral artery disease) ([place]) 04/18/2016   Lump or mass in breast 06/12/2012    Past Surgical History:  Procedure Laterality Date   BREAST BIOPSY Left 2014   INCISION AND DRAINAGE ABSCESS Right 09/04/2020   Procedure: INCISION AND DRAINAGE ABSCESS-Perianal;  Surgeon: Herbert Pun, MD;  Location: [facility];  Service: General;  Laterality: Right;   LOWER EXTREMITY ANGIOGRAPHY Left 05/28/2015    Procedure: Lower Extremity Angiography;  Surgeon: Algernon Huxley, MD;  Location: [place] [facility];  Service: Cardiovascular;  Laterality: Left;   PERIPHERAL VASCULAR CATHETERIZATION Right 07/16/2015   Procedure: Lower Extremity Angiography;  Surgeon: Algernon Huxley, MD;  Location: [place] [facility];  Service: Cardiovascular;  Laterality: Right;   PERIPHERAL VASCULAR CATHETERIZATION  07/16/2015   Procedure: Lower Extremity Intervention;  Surgeon: Algernon Huxley, MD;  Location: [place] [facility];  Service: Cardiovascular;;   RIGHT/LEFT HEART CATH AND CORONARY ANGIOGRAPHY Bilateral 11/07/2017   Procedure: RIGHT/LEFT HEART CATH AND CORONARY ANGIOGRAPHY;  Surgeon: Corey Skains, MD;  Location: [place] [facility];  Service: Cardiovascular;  Laterality: Bilateral;   TEE WITHOUT CARDIOVERSION N/A 11/07/2017   Procedure: TRANSESOPHAGEAL ECHOCARDIOGRAM (TEE);  Surgeon: Corey Skains, MD;  Location: [facility];  Service: Cardiovascular;  Laterality: N/A;   WISDOM TOOTH EXTRACTION      OB History     Gravida  2   Para  2   Term      Preterm      AB      Living  2      SAB      IAB      Ectopic      Multiple      Live Births           Obstetric Comments  1st Menstrual Cycle: 11 1st Pregnancy: 18          Home Medications  Prior to Admission medications   Medication Sig Start Date End Date Taking? Authorizing Provider  atorvastatin (LIPITOR) 80 MG tablet Take 80 mg by mouth daily. 06/26/19  Yes [provider]  benzonatate (TESSALON) 200 MG capsule Take 1 capsule (200 mg total) by mouth 2 (two) times daily as needed for cough. 10/24/20  Yes Rodriguez-Southworth, Sunday Spillers, PA-C  ezetimibe (ZETIA) 10 MG tablet Take 10 mg by mouth daily. 06/26/19  Yes [provider]  warfarin (COUMADIN) 6 MG tablet Take 6 mg by mouth at bedtime. 02/05/19  Yes [provider]  acetaminophen (TYLENOL) 500 MG tablet Take 2 tablets (1,000 mg total) by mouth every 6  (six) hours as needed. 06/25/17   Betancourt, Aura Fey, NP  EPINEPHrine 0.3 mg/0.3 mL IJ SOAJ injection Inject into the muscle as directed. 11/23/18   [provider]  furosemide (LASIX) 40 MG tablet furosemide 40 mg tablet    [provider]  nitroGLYCERIN (NITROSTAT) 0.4 MG SL tablet Place under the tongue. 07/22/19 07/21/20  [provider]  cetirizine (ZYRTEC) 10 MG tablet Take 10 mg by mouth daily. Reported on 05/28/2015  04/16/19  [provider]  fluticasone (FLONASE) 50 MCG/ACT nasal spray Place 2 sprays into both nostrils daily. 08/10/17 04/16/19  Lorin Picket, PA-C    Family History Family History  Problem Relation Age of Onset   Hypertension Mother    AAA (abdominal aortic aneurysm) Father    Other Father        bowel obstruction    Social History Social History   Tobacco Use   Smoking status: Former    Packs/day: 0.50    Years: 20.00    Pack years: 10.00    Types: Cigarettes    Quit date: 12/2018    Years since quitting: 1.8   Smokeless tobacco: Never  Vaping Use   Vaping Use: Never used  Substance Use Topics   Alcohol use: Yes    Comment: twice week   Drug use: No     Allergies   Shellfish allergy, Banana, Garlic, Isosorbide nitrate, and Fish-derived products   Review of Systems Review of Systems  Constitutional:  Negative for activity change, appetite change, chills, diaphoresis, fatigue and fever.  HENT:  Positive for postnasal drip and rhinorrhea. Negative for congestion, ear discharge, ear pain, sore throat and trouble swallowing.   Respiratory:  Positive for cough. Negative for chest tightness and shortness of breath.   Gastrointestinal:  Negative for diarrhea, nausea and vomiting.  Musculoskeletal:  Negative for myalgias.  Skin:  Negative for rash.  Neurological:  Negative for headaches.    Physical Exam Triage Vital Signs ED Triage Vitals  Enc Vitals Group     BP 10/24/20 1518 (!) 146/76     Pulse Rate  10/24/20 1518 (!) 58     Resp 10/24/20 1518 14     Temp 10/24/20 1518 98.4 F (36.9 C)     Temp Source 10/24/20 1518 Oral     SpO2 10/24/20 1518 100 %     Weight 10/24/20 1515 201 lb 8 oz (91.4 kg)     Height 10/24/20 1515 5\' 10"  (1.778 m)     Head Circumference --      Peak Flow --      Pain Score 10/24/20 1515 3     Pain Loc --      Pain Edu? --      Excl. in [place]? --    No data found.  Updated Vital  Signs BP (!) 146/76 (BP Location: Left Arm)   Pulse (!) 58   Temp 98.4 F (36.9 C) (Oral)   Resp 14   Ht 5\' 10"  (1.778 m)   Wt 201 lb 8 oz (91.4 kg)   SpO2 100%   BMI 28.91 kg/m   Visual Acuity Right Eye Distance:   Left Eye Distance:   Bilateral Distance:    Right Eye Near:   Left Eye Near:    Bilateral Near:     Physical Exam Physical Exam Vitals signs and nursing note reviewed.  Constitutional:      General: She is not in acute distress.    Appearance: Normal appearance. She is not ill-appearing, toxic-appearing or diaphoretic.  HENT:     Head: Normocephalic.     Right Ear: Tympanic membrane, ear canal and external ear normal.     Left Ear: Tympanic membrane, ear canal and external ear normal.     Nose: with clear mucous      Mouth/Throat:     Mouth: Mucous membranes are moist.  Eyes:     General: No scleral icterus.       Right eye: No discharge.        Left eye: No discharge.     Conjunctiva/sclera: Conjunctivae normal.  Neck:     Musculoskeletal: Neck supple. No neck rigidity.  Cardiovascular:     Rate and Rhythm: Normal rate and regular rhythm.     Heart sounds: No murmur.  Pulmonary:     Effort: Pulmonary effort is normal.     Breath sounds: Normal breath sounds.  Abdominal:     General: Bowel sounds are normal. There is no distension.     Palpations: Abdomen is soft. There is no mass.     Tenderness: There is no abdominal tenderness. There is no guarding or rebound.     Hernia: No hernia is present.  Musculoskeletal: Normal range of motion.   Lymphadenopathy:     Cervical: No cervical adenopathy.  Skin:    General: Skin is warm and dry.     Coloration: Skin is not jaundiced.     Findings: No rash.  Neurological:     Mental Status: She is alert and oriented to person, place, and time.     Gait: Gait normal.  Psychiatric:        Mood and Affect: Mood normal.        Behavior: Behavior normal.        Thought Content: Thought content normal.        Judgment: Judgment normal.    UC Treatments / Results  Labs (all labs ordered are listed, but only abnormal results are displayed) Labs Reviewed  RESP PANEL BY RT-PCR (FLU A&B, COVID) [id number]   Respiratory panel is neg EKG   Radiology No results found.  Procedures Procedures (including critical care time)  Medications Ordered in UC Medications - No data to display  Initial Impression / Assessment and Plan / UC Course  I have reviewed the triage vital signs and the nursing notes. Pertinent labs results that were available during my care of the patient were reviewed by me and considered in my medical decision making (see chart for details). Viral URI I sent Tessalon prn cough.     Final Clinical Impressions(s) / UC Diagnoses   Final diagnoses:  Upper respiratory tract infection, unspecified type   Discharge Instructions   None    ED Prescriptions     Medication Sig Dispense Auth.  Provider   benzonatate (TESSALON) 200 MG capsule Take 1 capsule (200 mg total) by mouth 2 (two) times daily as needed for cough. 30 capsule Rodriguez-Southworth, Sunday Spillers, PA-C      PDMP not reviewed this encounter.   Shelby Mattocks, PA-C 10/24/20 1718

## 2020-12-09 ENCOUNTER — Ambulatory Visit: Payer: Medicaid Other | Admitting: Podiatry

## 2020-12-23 ENCOUNTER — Ambulatory Visit: Payer: Medicaid Other | Admitting: Podiatry

## 2020-12-23 ENCOUNTER — Other Ambulatory Visit: Payer: Self-pay

## 2020-12-31 DIAGNOSIS — R001 Bradycardia, unspecified: Secondary | ICD-10-CM | POA: Insufficient documentation

## 2021-01-13 ENCOUNTER — Encounter (INDEPENDENT_AMBULATORY_CARE_PROVIDER_SITE_OTHER): Payer: Self-pay | Admitting: Podiatry

## 2021-01-13 NOTE — Progress Notes (Signed)
This encounter was created in error - please disregard.

## 2021-01-28 ENCOUNTER — Other Ambulatory Visit: Payer: Self-pay

## 2021-01-28 ENCOUNTER — Emergency Department
Admission: EM | Admit: 2021-01-28 | Discharge: 2021-01-28 | Disposition: A | Discharge status: Home / Self Care | Payer: Medicaid Other | Attending: Emergency Medicine | Admitting: Emergency Medicine

## 2021-01-28 DIAGNOSIS — R790 Abnormal level of blood mineral: Secondary | ICD-10-CM | POA: Diagnosis present

## 2021-01-28 DIAGNOSIS — Z7901 Long term (current) use of anticoagulants: Secondary | ICD-10-CM | POA: Diagnosis not present

## 2021-01-28 DIAGNOSIS — R791 Abnormal coagulation profile: Secondary | ICD-10-CM

## 2021-01-28 LAB — CBC
HCT: 48.9 % — ABNORMAL HIGH (ref 36.0–46.0)
Hemoglobin: 15.6 g/dL — ABNORMAL HIGH (ref 12.0–15.0)
MCH: 29.5 pg (ref 26.0–34.0)
MCHC: 31.9 g/dL (ref 30.0–36.0)
MCV: 92.4 fL (ref 80.0–100.0)
Platelets: 246 10*3/uL (ref 150–400)
RBC: 5.29 MIL/uL — ABNORMAL HIGH (ref 3.87–5.11)
RDW: 13.1 % (ref 11.5–15.5)
WBC: 7.8 10*3/uL (ref 4.0–10.5)
nRBC: 0 % (ref 0.0–0.2)

## 2021-01-28 LAB — BASIC METABOLIC PANEL
Anion gap: 10 (ref 5–15)
BUN: 10 mg/dL (ref 8–23)
CO2: 21 mmol/L — ABNORMAL LOW (ref 22–32)
Calcium: 9 mg/dL (ref 8.9–10.3)
Chloride: 106 mmol/L (ref 98–111)
Creatinine, Ser: 0.86 mg/dL (ref 0.44–1.00)
GFR, Estimated: >60 mL/min (ref >60)
Glucose, Bld: 83 mg/dL (ref 70–99)
Potassium: 4.1 mmol/L (ref 3.5–5.1)
Sodium: 137 mmol/L (ref 135–145)

## 2021-01-28 LAB — PROTIME-INR
INR: 5.1 (ref 0.8–1.2)
Prothrombin Time: 47.3 seconds — ABNORMAL HIGH (ref 11.4–15.2)

## 2021-01-28 NOTE — ED Provider Notes (Signed)
[facility] Provider Note    Event Date/Time   First MD Initiated Contact with Patient 01/28/21 2049     (approximate)   History   No chief complaint on file.   HPI Theresa Phelps is a 61 y.o. female with stated past medical history of open heart surgery and valve replacement in 2019 as well as chronic anticoagulation with warfarin since then who presents after having routine blood work drawn yesterday that showed an INR greater than 10.  Patient denies any easy bleeding/bruising, bleeding when brushing her teeth, vaginal bleeding, or pink/red-tinged urine.  Patient states that she has been taking her warfarin on time and as directed but does admit that she may have unintentionally doubled her dose due to how how often they adjust her dosing due to INR changes.     Physical Exam   Triage Vital Signs: ED Triage Vitals  Enc Vitals Group     BP 01/28/21 1756 (!) 163/82     Pulse Rate 01/28/21 1756 66     Resp 01/28/21 1756 20     Temp 01/28/21 1756 98.3 F (36.8 C)     Temp Source 01/28/21 1756 Oral     SpO2 01/28/21 1756 97 %     Weight 01/28/21 1757 191 lb (86.6 kg)     Height 01/28/21 1757 5\' 10"  (1.778 m)     Head Circumference --      Peak Flow --      Pain Score 01/28/21 1756 0     Pain Loc --      Pain Edu? --      Excl. in [place]? --     Most recent vital signs: Vitals:   01/28/21 1756  BP: (!) 163/82  Pulse: 66  Resp: 20  Temp: 98.3 F (36.8 C)  SpO2: 97%    General: Awake, oriented x3 CV:  Good peripheral perfusion.  Resp:  Normal effort.  Abd:  No distention.  Other:  Elderly African-American female sitting on stretcher in no distress with no active bleeding appreciated   ED Results / Procedures / Treatments   Labs (all labs ordered are listed, but only abnormal results are displayed) Labs Reviewed  CBC - Abnormal; Notable for the following components:      Result Value   RBC 5.29 (*)    Hemoglobin 15.6 (*)    HCT 48.9  (*)    All other components within normal limits  BASIC METABOLIC PANEL - Abnormal; Notable for the following components:   CO2 21 (*)    All other components within normal limits  PROTIME-INR - Abnormal; Notable for the following components:   Prothrombin Time 47.3 (*)    INR 5.1 (*)    All other components within normal limits   PROCEDURES:  Critical Care performed: No  .1-3 Lead EKG Interpretation Performed by: Naaman Plummer, MD Authorized by: Naaman Plummer, MD     Interpretation: normal     ECG rate:  67   ECG rate assessment: normal     Rhythm: sinus rhythm     Ectopy: none     Conduction: normal     MEDICATIONS ORDERED IN ED: Medications - No data to display   IMPRESSION / MDM / [place] / ED COURSE  I reviewed the triage vital signs and the nursing notes.  Differential diagnosis includes, but is not limited to, elevated INR, spontaneous bleeding, spontaneous intracranial hemorrhage  The patient is on the cardiac monitor to evaluate for evidence of arrhythmia and/or significant heart rate changes.  Patient presents after being seen by her doctor and found to have an INR greater than 10.  Upon repeat of her INR here it was 5.1 which is slightly elevated from her goal INR but was encouraged to take her lowest dose regimen for the next week prior to checking this INR again.  Patient has no red flag symptomatology for spontaneous bleeding including no bleeding from any small cuts, no bleeding from gums when brushing her teeth, and no spontaneous bruising.  Dispo: Discharge home with follow-up INR in a week       FINAL CLINICAL IMPRESSION(S) / ED DIAGNOSES   Final diagnoses:  Elevated INR (international normalized ratio)     Rx / DC Orders   ED Discharge Orders     None        Note:  This document was prepared using Dragon voice recognition software and may include unintentional dictation errors.   Naaman Plummer, MD 01/28/21 [phone]

## 2021-01-28 NOTE — Discharge Instructions (Addendum)
Please only take 6mg  per day for the next week

## 2021-01-28 NOTE — ED Triage Notes (Signed)
Pt sent from [facility] for INR >10. Results visible in chart.

## 2021-04-29 ENCOUNTER — Encounter: Payer: Self-pay | Admitting: *Deleted

## 2021-04-30 ENCOUNTER — Ambulatory Visit
Admission: RE | Admit: 2021-04-30 | Discharge: 2021-04-30 | Disposition: A | Discharge status: Home / Self Care | Payer: Medicaid Other | Attending: Gastroenterology | Admitting: Gastroenterology

## 2021-04-30 ENCOUNTER — Encounter
Admission: RE | Disposition: A | Discharge status: Home / Self Care | Payer: Self-pay | Source: Home / Self Care | Attending: Gastroenterology

## 2021-04-30 ENCOUNTER — Ambulatory Visit: Payer: Medicaid Other | Admitting: Certified Registered"

## 2021-04-30 DIAGNOSIS — M069 Rheumatoid arthritis, unspecified: Secondary | ICD-10-CM | POA: Insufficient documentation

## 2021-04-30 DIAGNOSIS — K219 Gastro-esophageal reflux disease without esophagitis: Secondary | ICD-10-CM | POA: Diagnosis not present

## 2021-04-30 DIAGNOSIS — E7800 Pure hypercholesterolemia, unspecified: Secondary | ICD-10-CM | POA: Insufficient documentation

## 2021-04-30 DIAGNOSIS — Z955 Presence of coronary angioplasty implant and graft: Secondary | ICD-10-CM | POA: Insufficient documentation

## 2021-04-30 DIAGNOSIS — Z952 Presence of prosthetic heart valve: Secondary | ICD-10-CM | POA: Diagnosis not present

## 2021-04-30 DIAGNOSIS — Z951 Presence of aortocoronary bypass graft: Secondary | ICD-10-CM | POA: Diagnosis not present

## 2021-04-30 DIAGNOSIS — I4891 Unspecified atrial fibrillation: Secondary | ICD-10-CM | POA: Diagnosis not present

## 2021-04-30 DIAGNOSIS — I2510 Atherosclerotic heart disease of native coronary artery without angina pectoris: Secondary | ICD-10-CM | POA: Diagnosis not present

## 2021-04-30 DIAGNOSIS — J449 Chronic obstructive pulmonary disease, unspecified: Secondary | ICD-10-CM | POA: Insufficient documentation

## 2021-04-30 DIAGNOSIS — I110 Hypertensive heart disease with heart failure: Secondary | ICD-10-CM | POA: Insufficient documentation

## 2021-04-30 DIAGNOSIS — I509 Heart failure, unspecified: Secondary | ICD-10-CM | POA: Diagnosis not present

## 2021-04-30 DIAGNOSIS — Z1211 Encounter for screening for malignant neoplasm of colon: Secondary | ICD-10-CM | POA: Insufficient documentation

## 2021-04-30 DIAGNOSIS — J45909 Unspecified asthma, uncomplicated: Secondary | ICD-10-CM | POA: Diagnosis not present

## 2021-04-30 DIAGNOSIS — I739 Peripheral vascular disease, unspecified: Secondary | ICD-10-CM | POA: Insufficient documentation

## 2021-04-30 DIAGNOSIS — Z95810 Presence of automatic (implantable) cardiac defibrillator: Secondary | ICD-10-CM | POA: Insufficient documentation

## 2021-04-30 DIAGNOSIS — I252 Old myocardial infarction: Secondary | ICD-10-CM | POA: Diagnosis not present

## 2021-04-30 HISTORY — DX: Pulmonary hypertension, unspecified: I27.20

## 2021-04-30 HISTORY — DX: Atherosclerotic heart disease of native coronary artery without angina pectoris: I25.10

## 2021-04-30 HISTORY — PX: COLONOSCOPY WITH PROPOFOL: SHX5780

## 2021-04-30 HISTORY — DX: Prediabetes: R73.03

## 2021-04-30 HISTORY — DX: Heart failure, unspecified: I50.9

## 2021-04-30 HISTORY — DX: Nonrheumatic mitral (valve) insufficiency: I34.0

## 2021-04-30 LAB — PROTIME-INR
INR: 1.6 — ABNORMAL HIGH (ref 0.8–1.2)
Prothrombin Time: 19 seconds — ABNORMAL HIGH (ref 11.4–15.2)

## 2021-04-30 SURGERY — COLONOSCOPY WITH PROPOFOL
Anesthesia: Monitor Anesthesia Care

## 2021-04-30 MED ORDER — SODIUM CHLORIDE 0.9 % IV SOLN
INTRAVENOUS | Status: DC
  Administered 2021-04-30: 20 mL/h via INTRAVENOUS

## 2021-04-30 MED ORDER — GLYCOPYRROLATE 0.2 MG/ML IJ SOLN
INTRAMUSCULAR | Status: DC | PRN
  Administered 2021-04-30: .2 mg via INTRAVENOUS

## 2021-04-30 MED ORDER — PROPOFOL 10 MG/ML IV BOLUS
INTRAVENOUS | Status: DC | PRN
  Administered 2021-04-30: 80 mg via INTRAVENOUS
  Administered 2021-04-30: 40 mg via INTRAVENOUS

## 2021-04-30 MED ORDER — LIDOCAINE HCL (CARDIAC) PF 100 MG/5ML IV SOSY
PREFILLED_SYRINGE | INTRAVENOUS | Status: DC | PRN
  Administered 2021-04-30: 40 mg via INTRAVENOUS

## 2021-04-30 MED ORDER — EPHEDRINE SULFATE (PRESSORS) 50 MG/ML IJ SOLN
INTRAMUSCULAR | Status: DC | PRN
  Administered 2021-04-30: 10 mg via INTRAVENOUS

## 2021-04-30 NOTE — Op Note (Signed)
[facility] ?Gastroenterology ?Patient Name: Theresa Phelps ?Procedure Date: 04/30/2021 9:30 AM ?MRN: [id number] ?Account #: [id number] ?Date of Birth: [birth year] ?Admit Type: Outpatient ?Age: 61 ?Room: [facility] ENDO ROOM 3 ?Gender: Female ?Note Status: Finalized ?Instrument Name: Colonoscope [id number] ?Procedure:             Colonoscopy ?Indications:           Screening for colorectal malignant neoplasm ?Providers:             Andrey Farmer MD, MD ?Referring MD:          [facility] (Referring MD) ?Medicines:             Monitored Anesthesia Care ?Complications:         No immediate complications. ?Procedure:             Pre-Anesthesia Assessment: ?                       - Prior to the procedure, a History and Physical was  ?                       performed, and patient medications and allergies were  ?                       reviewed. The patient is competent. The risks and  ?                       benefits of the procedure and the sedation options and  ?                       risks were discussed with the patient. All questions  ?                       were answered and informed consent was obtained.  ?                       Patient identification and proposed procedure were  ?                       verified by the physician, the nurse, the  ?                       anesthesiologist, the anesthetist and the technician  ?                       in the endoscopy suite. Mental Status Examination:  ?                       alert and oriented. Airway Examination: normal  ?                       oropharyngeal airway and neck mobility. Respiratory  ?                       Examination: clear to auscultation. CV Examination:  ?                       normal. Prophylactic Antibiotics: The patient does not  ?  require prophylactic antibiotics. Prior  ?                       Anticoagulants: The patient has taken Coumadin  ?                       (warfarin), last dose was 2 days  prior to procedure.  ?                       ASA Grade Assessment: III - A patient with severe  ?                       systemic disease. After reviewing the risks and  ?                       benefits, the patient was deemed in satisfactory  ?                       condition to undergo the procedure. The anesthesia  ?                       plan was to use monitored anesthesia care (MAC).  ?                       Immediately prior to administration of medications,  ?                       the patient was re-assessed for adequacy to receive  ?                       sedatives. The heart rate, respiratory rate, oxygen  ?                       saturations, blood pressure, adequacy of pulmonary  ?                       ventilation, and response to care were monitored  ?                       throughout the procedure. The physical status of the  ?                       patient was re-assessed after the procedure. ?                       After obtaining informed consent, the colonoscope was  ?                       passed under direct vision. Throughout the procedure,  ?                       the patient's blood pressure, pulse, and oxygen  ?                       saturations were monitored continuously. The  ?                       Colonoscope was introduced through the anus and  ?  advanced to the the cecum, identified by appendiceal  ?                       orifice and ileocecal valve. The colonoscopy was  ?                       performed without difficulty. The patient tolerated  ?                       the procedure well. The quality of the bowel  ?                       preparation was poor. ?Findings: ?     The perianal and digital rectal examinations were normal. ?     Solid stool was found in the sigmoid colon, in the descending colon, in  ?     the transverse colon, at the hepatic flexure, in the ascending colon and  ?     in the cecum, precluding visualization. ?     The exam was otherwise  without abnormality on direct and retroflexion  ?     views. ?Impression:            - Preparation of the colon was poor. ?                       - Stool in the sigmoid colon, in the descending colon,  ?                       in the transverse colon, at the hepatic flexure, in  ?                       the ascending colon and in the cecum. ?                       - The examination was otherwise normal on direct and  ?                       retroflexion views. ?                       - No specimens collected. ?Recommendation:        - Discharge patient to home. ?                       - Resume previous diet. ?                       - Resume Coumadin (warfarin) at prior dose today.  ?                       Refer to managing physician for further adjustment of  ?                       therapy. ?                       - Repeat colonoscopy at the next available appointment  ?                       because the bowel preparation  was suboptimal. ?                       - Return to referring physician as previously  ?                       scheduled. ?                       - Recommend lovenox bridge given history of mitral  ?                       valve replacement ?Procedure Code(s):     --- Professional --- ?                       U4403, Colorectal cancer screening; colonoscopy on  ?                       individual not meeting criteria for high risk ?Diagnosis Code(s):     --- Professional --- ?                       Z12.11, Encounter for screening for malignant neoplasm  ?                       of colon ?CPT copyright 2019 American Medical Association. All rights reserved. ?The codes documented in this report are preliminary and upon coder review may  ?be revised to meet current compliance requirements. ?Andrey Farmer MD, MD ?04/30/2021 9:49:19 AM ?Number of Addenda: 0 ?Note Initiated On: 04/30/2021 9:30 AM ?Scope Withdrawal Time: 0 hours 1 minute 55 seconds  ?Total Procedure Duration: 0 hours 5 minutes 42 seconds   ?Estimated Blood Loss:  Estimated blood loss: none. ?     [facility] ?

## 2021-04-30 NOTE — Anesthesia Preprocedure Evaluation (Addendum)
Anesthesia Evaluation  ?Patient identified by MRN, date of birth, ID band ?Patient awake ? ? ? ?Reviewed: ?Allergy & Precautions, NPO status , Patient's Chart, lab work & pertinent test results ? ?History of Anesthesia Complications ?Negative for: history of anesthetic complications ? ?Airway ?Mallampati: III ? ?TM Distance: >3 FB ?Neck ROM: Full ? ? ? Dental ? ?(+) Edentulous Upper, Edentulous Lower ?  ?Pulmonary ?asthma , neg sleep apnea, COPD, Patient abstained from smoking.Not current smoker, former smoker,  ?  ?Pulmonary exam normal ?breath sounds clear to auscultation ? ? ? ? ? ? Cardiovascular ?Exercise Tolerance: Poor ?METS(-) hypertensionpulmonary hypertension+ CAD, + Past MI, + Cardiac Stents, + CABG, + Peripheral Vascular Disease (S/P insertion of iliac artery stent) and +CHF  ?+ dysrhythmias Atrial Fibrillation + Cardiac Defibrillator ?+ Valvular Problems/Murmurs (s/p MVR)  ?Rhythm:Regular Rate:Bradycardia ?- Systolic murmurs ?TTE 2021 (stented after this) ?INTERPRETATION  ?REGIONALLY-IMPAIRED LV WITH PRESERVED LEFT VENTRICULAR SYSTOLIC FUNCTION; ESTIMATED EF =  ?40%  ?NORMAL RIGHT VENTRICULAR SYSTOLIC FUNCTION  ?MILD VALVULAR REGURGITATION (See above)  ?NO VALVULAR STENOSIS  ?MILD LA ENLARGEMENT  ?NORMAL FUNCTION OF MITRAL VALVE PROSTHESIS  ?BASAL-TO-APICAL INFERIOR POSTERIOR WALL HYPOKINESIS  ? ? ?s/p CABG 2020, PCI and stent of native OM1 2022 ?  ?Neuro/Psych ?negative neurological ROS ? negative psych ROS  ? GI/Hepatic ?GERD  ,(+)  ?  ? (-) substance abuse ? ,   ?Endo/Other  ?neg diabetes ? Renal/GU ?negative Renal ROS  ? ?  ?Musculoskeletal ? ? Abdominal ?(+) + obese,   ?Peds ? Hematology ?  ?Anesthesia Other Findings ?Past Medical History: ?No date: Acid reflux ?No date: Arthritis ?    Comment:  rheumatoid arthritis ?No date: Asthma ?No date: High cholesterol ?No date: Peripheral vascular disease ([place]) ? Reproductive/Obstetrics ? ?  ? ? ? ? ? ? ? ? ? ? ? ? ? ?  ?   ? ? ? ? ? ? ? ?Anesthesia Physical ? ?Anesthesia Plan ? ?ASA: 3 ? ?Anesthesia Plan: General  ? ?Post-op Pain Management:   ? ?Induction: Intravenous ? ?PONV Risk Score and Plan: 3 and Ondansetron, Propofol infusion, TIVA and Treatment may vary due to age or medical condition ? ?Airway Management Planned: Natural Airway and Nasal Cannula ? ?Additional Equipment: None ? ?Intra-op Plan:  ? ?Post-operative Plan:  ? ?Informed Consent: I have reviewed the patients History and Physical, chart, labs and discussed the procedure including the risks, benefits and alternatives for the proposed anesthesia with the patient or authorized representative who has indicated his/her understanding and acceptance.  ? ? ? ?Dental advisory given ? ?Plan Discussed with: CRNA and Surgeon ? ?Anesthesia Plan Comments:   ? ? ? ? ? ? ?Anesthesia Quick Evaluation ? ?

## 2021-04-30 NOTE — Anesthesia Postprocedure Evaluation (Signed)
Anesthesia Post Note ? ?Patient: Theresa Phelps ? ?Procedure(s) Performed: COLONOSCOPY WITH PROPOFOL ? ?Patient location during evaluation: Endoscopy ?Anesthesia Type: MAC ?Level of consciousness: awake and alert ?Pain management: pain level controlled ?Vital Signs Assessment: post-procedure vital signs reviewed and stable ?Respiratory status: spontaneous breathing, nonlabored ventilation and respiratory function stable ?Cardiovascular status: blood pressure returned to baseline and stable ?Postop Assessment: no apparent nausea or vomiting ?Anesthetic complications: no ? ? ?No notable events documented. ? ? ?Last Vitals:  ?Vitals:  ? 04/30/21 1000 04/30/21 1010  ?BP: 98/66 (!) 114/54  ?Pulse: (!) 51 (!) 48  ?Resp: 16 15  ?Temp:    ?SpO2: 100% 100%  ?  ?Last Pain:  ?Vitals:  ? 04/30/21 1010  ?TempSrc:   ?PainSc: 0-No pain  ? ? ?  ?  ?  ?  ?  ?  ? ?[place] Ouch ? ? ? ? ?

## 2021-04-30 NOTE — Interval H&P Note (Signed)
History and Physical Interval Note: ? ?04/30/2021 ?9:31 AM ? ?Theresa Phelps  has presented today for surgery, with the diagnosis of Z12.11 - Colon cancer screening.  The various methods of treatment have been discussed with the patient and family. After consideration of risks, benefits and other options for treatment, the patient has consented to  Procedure(s): ?COLONOSCOPY WITH PROPOFOL (N/A) as a surgical intervention.  The patient's history has been reviewed, patient examined, no change in status, stable for surgery.  I have reviewed the patient's chart and labs.  Questions were answered to the patient's satisfaction.   ? ? ?[place] Micai Apolinar ? ?Ok to proceed with colonoscopy ?

## 2021-04-30 NOTE — Transfer of Care (Signed)
Immediate Anesthesia Transfer of Care Note ? ?Patient: Theresa Phelps ? ?Procedure(s) Performed: COLONOSCOPY WITH PROPOFOL ? ?Patient Location: Endoscopy Unit ? ?Anesthesia Type:MAC ? ?Level of Consciousness: awake, alert  and oriented ? ?Airway & Oxygen Therapy: Patient Spontanous Breathing and Patient connected to nasal cannula oxygen ? ?Post-op Assessment: Report given to RN, Post -op Vital signs reviewed and stable and Patient moving all extremities ? ?Post vital signs: Reviewed and stable ? ?Last Vitals:  ?Vitals Value Taken Time  ?BP    ?Temp    ?Pulse    ?Resp    ?SpO2    ? ? ?Last Pain:  ?Vitals:  ? 04/30/21 0908  ?TempSrc: Temporal  ?PainSc: 8   ?   ? ?  ? ?Complications: No notable events documented. ?

## 2021-04-30 NOTE — H&P (Signed)
Outpatient short stay form Pre-procedure ?04/30/2021  ?Lesly Rubenstein, MD ? ?Primary Physician: [facility] ? ?Reason for visit:  Screening colonoscopy ? ?History of present illness:   ? ?61 y/o lady with history of mitral valve replacement here for screening colonoscopy. Last took coumadin two days ago. INR checked this morning and was 1.6. Explained risks of bleeding and patient wishes to proceed. No prior colonoscopies. No significant abdominal surgeries. No family history of GI malignancies. ? ? ? ?Current Facility-Administered Medications:  ?  0.9 %  sodium chloride infusion, , Intravenous, Continuous, Maitri Schnoebelen, Hilton Cork, MD, Last Rate: 20 mL/hr at 04/30/21 0925, 20 mL/hr at 04/30/21 0925 ? ?Medications Prior to Admission  ?Medication Sig Dispense Refill Last Dose  ? acetaminophen (TYLENOL) 500 MG tablet Take 2 tablets (1,000 mg total) by mouth every 6 (six) hours as needed. 30 tablet 0 Past Week  ? atorvastatin (LIPITOR) 80 MG tablet Take 80 mg by mouth daily.   Past Week  ? benzonatate (TESSALON) 200 MG capsule Take 1 capsule (200 mg total) by mouth 2 (two) times daily as needed for cough. 30 capsule 0 Past Week  ? EPINEPHrine 0.3 mg/0.3 mL IJ SOAJ injection Inject into the muscle as directed.   Past Week  ? ezetimibe (ZETIA) 10 MG tablet Take 10 mg by mouth daily.   Past Week  ? furosemide (LASIX) 40 MG tablet furosemide 40 mg tablet   Past Week  ? gabapentin (NEURONTIN) 100 MG capsule Take 100 mg by mouth 2 (two) times daily.   Past Week  ? loratadine (CLARITIN) 10 MG tablet Take 10 mg by mouth daily.   Past Week  ? warfarin (COUMADIN) 6 MG tablet Take 6 mg by mouth at bedtime.   Past Week  ? nitroGLYCERIN (NITROSTAT) 0.4 MG SL tablet Place under the tongue.     ? ? ? ?Allergies  ?Allergen Reactions  ? Shellfish Allergy Anaphylaxis  ? Banana   ?  Other reaction(s): Other (See Comments) ?Burning in the mouth  ? Garlic   ?  Other reaction(s): Other (See Comments) ?Pt states arms freeze up  and can't talk but no SOB  ? Isosorbide Nitrate Itching  ?  Blurred vision  ? Fish-Derived Products Rash  ? ? ? ?Past Medical History:  ?Diagnosis Date  ? Acid reflux   ? Arthritis   ? rheumatoid arthritis  ? Asthma   ? CHF (congestive heart failure) ([place])   ? Coronary artery disease   ? High cholesterol   ? Peripheral vascular disease ([place])   ? Pre-diabetes   ? Pulmonary hypertension ([place])   ? Severe mitral regurgitation   ? ? ?Review of systems:  Otherwise negative.  ? ? ?Physical Exam ? ?Gen: Alert, oriented. Appears stated age.  ?HEENT: PERRLA. ?Lungs: No respiratory distress ?CV: RRR ?Abd: soft, benign, no masses ?Ext: No edema ? ? ? ?Planned procedures: Proceed with colonoscopy. The patient understands the nature of the planned procedure, indications, risks, alternatives and potential complications including but not limited to bleeding, infection, perforation, damage to internal organs and possible oversedation/side effects from anesthesia. The patient agrees and gives consent to proceed.  ?Please refer to procedure notes for findings, recommendations and patient disposition/instructions.  ? ? ? ?Lesly Rubenstein, MD ?[facility] ? ? ? ?  ? ?

## 2021-05-03 ENCOUNTER — Encounter: Payer: Self-pay | Admitting: Gastroenterology

## 2021-07-09 DIAGNOSIS — M47812 Spondylosis without myelopathy or radiculopathy, cervical region: Secondary | ICD-10-CM | POA: Insufficient documentation

## 2021-07-09 DIAGNOSIS — M5412 Radiculopathy, cervical region: Secondary | ICD-10-CM | POA: Insufficient documentation

## 2021-07-09 DIAGNOSIS — M542 Cervicalgia: Secondary | ICD-10-CM | POA: Insufficient documentation

## 2021-07-09 DIAGNOSIS — S161XXA Strain of muscle, fascia and tendon at neck level, initial encounter: Secondary | ICD-10-CM | POA: Insufficient documentation

## 2021-08-12 ENCOUNTER — Encounter: Payer: Self-pay | Admitting: *Deleted

## 2021-08-13 ENCOUNTER — Encounter: Payer: Self-pay | Admitting: *Deleted

## 2021-08-13 ENCOUNTER — Ambulatory Visit
Admission: RE | Admit: 2021-08-13 | Discharge: 2021-08-13 | Disposition: A | Discharge status: Home / Self Care | Payer: Medicaid Other | Attending: Gastroenterology | Admitting: Gastroenterology

## 2021-08-13 ENCOUNTER — Ambulatory Visit: Payer: Medicaid Other | Admitting: General Practice

## 2021-08-13 ENCOUNTER — Encounter
Admission: RE | Disposition: A | Discharge status: Home / Self Care | Payer: Self-pay | Source: Home / Self Care | Attending: Gastroenterology

## 2021-08-13 ENCOUNTER — Other Ambulatory Visit: Payer: Self-pay

## 2021-08-13 DIAGNOSIS — Z1211 Encounter for screening for malignant neoplasm of colon: Secondary | ICD-10-CM | POA: Insufficient documentation

## 2021-08-13 DIAGNOSIS — K6389 Other specified diseases of intestine: Secondary | ICD-10-CM | POA: Insufficient documentation

## 2021-08-13 DIAGNOSIS — K640 First degree hemorrhoids: Secondary | ICD-10-CM | POA: Insufficient documentation

## 2021-08-13 DIAGNOSIS — K529 Noninfective gastroenteritis and colitis, unspecified: Secondary | ICD-10-CM | POA: Diagnosis not present

## 2021-08-13 HISTORY — DX: Acute myocardial infarction, unspecified: I21.9

## 2021-08-13 HISTORY — PX: COLONOSCOPY WITH PROPOFOL: SHX5780

## 2021-08-13 HISTORY — DX: Carpal tunnel syndrome, unspecified upper limb: G56.00

## 2021-08-13 LAB — PROTIME-INR
INR: 1 (ref 0.8–1.2)
Prothrombin Time: 13.1 seconds (ref 11.4–15.2)

## 2021-08-13 SURGERY — COLONOSCOPY WITH PROPOFOL
Anesthesia: General

## 2021-08-13 MED ORDER — PROPOFOL 10 MG/ML IV BOLUS
INTRAVENOUS | Status: DC | PRN
  Administered 2021-08-13: 70 mg via INTRAVENOUS

## 2021-08-13 MED ORDER — EPHEDRINE SULFATE (PRESSORS) 50 MG/ML IJ SOLN
INTRAMUSCULAR | Status: DC | PRN
  Administered 2021-08-13: 10 mg via INTRAVENOUS

## 2021-08-13 MED ORDER — EPHEDRINE 5 MG/ML INJ
INTRAVENOUS | Status: AC
  Filled 2021-08-13: qty 5

## 2021-08-13 MED ORDER — LIDOCAINE HCL (CARDIAC) PF 100 MG/5ML IV SOSY
PREFILLED_SYRINGE | INTRAVENOUS | Status: DC | PRN
  Administered 2021-08-13: 50 mg via INTRAVENOUS

## 2021-08-13 MED ORDER — SODIUM CHLORIDE 0.9 % IV SOLN
INTRAVENOUS | Status: DC
Start: 2021-08-13 — End: 2021-08-13

## 2021-08-13 MED ORDER — PROPOFOL 10 MG/ML IV BOLUS
INTRAVENOUS | Status: AC
  Filled 2021-08-13: qty 20

## 2021-08-13 MED ORDER — LIDOCAINE HCL (PF) 2 % IJ SOLN
INTRAMUSCULAR | Status: AC
  Filled 2021-08-13: qty 5

## 2021-08-13 MED ORDER — PROPOFOL 500 MG/50ML IV EMUL
INTRAVENOUS | Status: DC | PRN
  Administered 2021-08-13: 150 ug/kg/min via INTRAVENOUS

## 2021-08-13 NOTE — Anesthesia Postprocedure Evaluation (Signed)
Anesthesia Post Note  Patient: Theresa Phelps  Procedure(s) Performed: COLONOSCOPY WITH PROPOFOL  Patient location during evaluation: Endoscopy Anesthesia Type: General Level of consciousness: awake and alert Pain management: pain level controlled Vital Signs Assessment: post-procedure vital signs reviewed and stable Respiratory status: spontaneous breathing, nonlabored ventilation, respiratory function stable and patient connected to nasal cannula oxygen Cardiovascular status: blood pressure returned to baseline and stable Postop Assessment: no apparent nausea or vomiting Anesthetic complications: no   No notable events documented.   Last Vitals:  Vitals:   08/13/21 1532 08/13/21 1542  BP: 111/74 127/84  Pulse: (!) 58 (!) 52  Resp: 17 13  Temp:    SpO2: 100% 100%    Last Pain:  Vitals:   08/13/21 1542  TempSrc:   PainSc: 0-No pain                 Precious Haws Chanese Hartsough

## 2021-08-13 NOTE — Op Note (Signed)
[facility] Patient Name: Theresa Phelps Procedure Date: 08/13/2021 3:02 PM MRN: [id number] Account #: [id number] Date of Birth: 29-Oct-1960 Admit Type: Outpatient Age: 61 Room: [facility] ENDO ROOM 3 Gender: Female Note Status: Finalized Instrument Name: Jasper Riling [id number] Procedure:             Colonoscopy Indications:           Screening for colorectal malignant neoplasm Providers:             Andrey Farmer MD, MD Referring MD:          [facility] (Referring MD) Medicines:             Monitored Anesthesia Care Complications:         No immediate complications. Estimated blood loss:                         Minimal. Procedure:             Pre-Anesthesia Assessment:                        - Prior to the procedure, a History and Physical was                         performed, and patient medications and allergies were                         reviewed. The patient is competent. The risks and                         benefits of the procedure and the sedation options and                         risks were discussed with the patient. All questions                         were answered and informed consent was obtained.                         Patient identification and proposed procedure were                         verified by the physician, the nurse, the                         anesthesiologist, the anesthetist and the technician                         in the endoscopy suite. Mental Status Examination:                         alert and oriented. Airway Examination: normal                         oropharyngeal airway and neck mobility. Respiratory                         Examination: clear to auscultation. CV Examination:  normal. Prophylactic Antibiotics: The patient does not                         require prophylactic antibiotics. Prior                         Anticoagulants: The patient has taken Coumadin                          (warfarin), last dose was 2 days prior to procedure.                         ASA Grade Assessment: III - A patient with severe                         systemic disease. After reviewing the risks and                         benefits, the patient was deemed in satisfactory                         condition to undergo the procedure. The anesthesia                         plan was to use monitored anesthesia care (MAC).                         Immediately prior to administration of medications,                         the patient was re-assessed for adequacy to receive                         sedatives. The heart rate, respiratory rate, oxygen                         saturations, blood pressure, adequacy of pulmonary                         ventilation, and response to care were monitored                         throughout the procedure. The physical status of the                         patient was re-assessed after the procedure.                        After obtaining informed consent, the colonoscope was                         passed under direct vision. Throughout the procedure,                         the patient's blood pressure, pulse, and oxygen                         saturations were monitored continuously. The  Colonoscope was introduced through the anus and                         advanced to the the terminal ileum. The colonoscopy                         was performed without difficulty. The patient                         tolerated the procedure well. The quality of the bowel                         preparation was poor. Findings:      The perianal and digital rectal examinations were normal.      The terminal ileum appeared normal.      A localized area of moderately erythematous mucosa was found at the       ileocecal valve. Biopsies were taken with a cold forceps for histology.       Estimated blood loss was minimal.      Internal  hemorrhoids were found during retroflexion. The hemorrhoids       were Grade I (internal hemorrhoids that do not prolapse).      The exam was otherwise without abnormality on direct and retroflexion       views. Impression:            - Preparation of the colon was poor.                        - The examined portion of the ileum was normal.                        - Erythematous mucosa at the ileocecal valve. Biopsied.                        - Internal hemorrhoids.                        - The examination was otherwise normal on direct and                         retroflexion views. Recommendation:        - Discharge patient to home.                        - Resume previous diet.                        - Resume Coumadin (warfarin) at prior dose today.                        - Await pathology results.                        - Repeat colonoscopy at appointment to be scheduled                         because the bowel preparation was suboptimal.                        - Return to referring  physician as previously                         scheduled. Procedure Code(s):     --- Professional ---                        [phone], Colonoscopy, flexible; with biopsy, single or                         multiple Diagnosis Code(s):     --- Professional ---                        Z12.11, Encounter for screening for malignant neoplasm                         of colon                        K64.0, First degree hemorrhoids                        K63.89, Other specified diseases of intestine CPT copyright 2019 American [employer] Association. All rights reserved. The codes documented in this report are preliminary and upon coder review may  be revised to meet current compliance requirements. Andrey Farmer MD, MD 08/13/2021 3:21:13 PM Number of Addenda: 0 Note Initiated On: 08/13/2021 3:02 PM Scope Withdrawal Time: 0 hours 4 minutes 31 seconds  Total Procedure Duration: 0 hours 8 minutes 44 seconds  Estimated  Blood Loss:  Estimated blood loss was minimal.      [facility]

## 2021-08-13 NOTE — Transfer of Care (Signed)
Immediate Anesthesia Transfer of Care Note  Patient: Theresa Phelps  Procedure(s) Performed: COLONOSCOPY WITH PROPOFOL  Patient Location: PACU  Anesthesia Type:General  Level of Consciousness: awake, oriented and drowsy  Airway & Oxygen Therapy: Patient Spontanous Breathing  Post-op Assessment: Report given to RN and Post -op Vital signs reviewed and stable  Post vital signs: Reviewed and stable  Last Vitals:  Vitals Value Taken Time  BP    Temp    Pulse    Resp    SpO2      Last Pain:  Vitals:   08/13/21 1344  TempSrc: Temporal  PainSc: 0-No pain         Complications: No notable events documented.

## 2021-08-13 NOTE — Interval H&P Note (Signed)
History and Physical Interval Note:  08/13/2021 3:02 PM  Theresa Phelps  has presented today for surgery, with the diagnosis of cOLON cANCER SCREENING.  The various methods of treatment have been discussed with the patient and family. After consideration of risks, benefits and other options for treatment, the patient has consented to  Procedure(s): COLONOSCOPY WITH PROPOFOL (N/A) as a surgical intervention.  The patient's history has been reviewed, patient examined, no change in status, stable for surgery.  I have reviewed the patient's chart and labs.  Questions were answered to the patient's satisfaction.     Lesly Rubenstein  Ok to proceed with colonoscopy

## 2021-08-13 NOTE — Anesthesia Preprocedure Evaluation (Signed)
Anesthesia Evaluation  Patient identified by MRN, date of birth, ID band Patient awake    Reviewed: Allergy & Precautions, NPO status , Patient's Chart, lab work & pertinent test results  History of Anesthesia Complications Negative for: history of anesthetic complications  Airway Mallampati: III  TM Distance: >3 FB Neck ROM: Full    Dental  (+) Edentulous Upper, Edentulous Lower   Pulmonary shortness of breath, neg sleep apnea, Patient abstained from smoking.Not current smoker, former smoker,    Pulmonary exam normal breath sounds clear to auscultation       Cardiovascular Exercise Tolerance: Poor METS(-) hypertensionpulmonary hypertension+ CAD, + Past MI, + Cardiac Stents, + CABG, + Peripheral Vascular Disease (S/P insertion of iliac artery stent) and +CHF  + dysrhythmias Atrial Fibrillation + Cardiac Defibrillator + Valvular Problems/Murmurs (s/p MVR)  Rhythm:Regular Rate:Bradycardia - Systolic murmurs TTE 4098 (stented after this) INTERPRETATION  REGIONALLY-IMPAIRED LV WITH PRESERVED LEFT VENTRICULAR SYSTOLIC FUNCTION; ESTIMATED EF =  40%  NORMAL RIGHT VENTRICULAR SYSTOLIC FUNCTION  MILD VALVULAR REGURGITATION (See above)  NO VALVULAR STENOSIS  MILD LA ENLARGEMENT  NORMAL FUNCTION OF MITRAL VALVE PROSTHESIS  BASAL-TO-APICAL INFERIOR POSTERIOR WALL HYPOKINESIS    s/p CABG 2020, PCI and stent of native OM1 2022   Neuro/Psych negative neurological ROS  negative psych ROS   GI/Hepatic GERD  Controlled,(+)     (-) substance abuse  ,   Endo/Other  neg diabetes  Renal/GU negative Renal ROS     Musculoskeletal  (+) Arthritis , Rheumatoid disorders,    Abdominal (+) + obese,   Peds  Hematology   Anesthesia Other Findings Past Medical History: No date: Acid reflux No date: Arthritis     Comment:  rheumatoid arthritis No date: Asthma No date: High cholesterol No date: Peripheral vascular disease (H[facility])   Reproductive/Obstetrics                             Anesthesia Physical  Anesthesia Plan  ASA: 3  Anesthesia Plan: General   Post-op Pain Management:    Induction: Intravenous  PONV Risk Score and Plan: 3 and Propofol infusion, TIVA and Treatment may vary due to age or medical condition  Airway Management Planned: Natural Airway and Nasal Cannula  Additional Equipment: None  Intra-op Plan:   Post-operative Plan:   Informed Consent: I have reviewed the patients History and Physical, chart, labs and discussed the procedure including the risks, benefits and alternatives for the proposed anesthesia with the patient or authorized representative who has indicated his/her understanding and acceptance.     Dental advisory given  Plan Discussed with: CRNA and Surgeon  Anesthesia Plan Comments:        Anesthesia Quick Evaluation

## 2021-08-13 NOTE — H&P (Signed)
Outpatient short stay form Pre-procedure 08/13/2021  Lesly Rubenstein, MD  Primary Physician: [facility]  Reason for visit:  Screening colonoscopy  History of present illness:    61 y/o lady with history of mitral valve replacement on coumadin. Last dose of coumadin was 2 days ago but INR today is 1. No significant abdominal surgeries. No family history of GI malignancies.    Current Facility-Administered Medications:    0.9 %  sodium chloride infusion, , Intravenous, Continuous, Kathalene Sporer, Hilton Cork, MD, Last Rate: 20 mL/hr at 08/13/21 1432, New Bag at 08/13/21 1432  Medications Prior to Admission  Medication Sig Dispense Refill Last Dose   nitroGLYCERIN (NITROSTAT) 0.4 MG SL tablet Place 0.4 mg under the tongue every 5 (five) minutes as needed for chest pain.      acetaminophen (TYLENOL) 500 MG tablet Take 2 tablets (1,000 mg total) by mouth every 6 (six) hours as needed. 30 tablet 0    atorvastatin (LIPITOR) 80 MG tablet Take 80 mg by mouth daily.   08/11/2021   benzonatate (TESSALON) 200 MG capsule Take 1 capsule (200 mg total) by mouth 2 (two) times daily as needed for cough. 30 capsule 0    EPINEPHrine 0.3 mg/0.3 mL IJ SOAJ injection Inject into the muscle as directed.      ezetimibe (ZETIA) 10 MG tablet Take 10 mg by mouth daily.   08/11/2021   furosemide (LASIX) 40 MG tablet furosemide 40 mg tablet   08/11/2021   gabapentin (NEURONTIN) 100 MG capsule Take 100 mg by mouth 2 (two) times daily.      loratadine (CLARITIN) 10 MG tablet Take 10 mg by mouth daily.      nitroGLYCERIN (NITROSTAT) 0.4 MG SL tablet Place under the tongue.      warfarin (COUMADIN) 6 MG tablet Take 6 mg by mouth at bedtime.   08/11/2021     Allergies  Allergen Reactions   Shellfish Allergy Anaphylaxis   Banana     Other reaction(s): Other (See Comments) Burning in the mouth   Garlic     Other reaction(s): Other (See Comments) Pt states arms freeze up and can't talk but no SOB   Isosorbide  Nitrate Itching    Blurred vision   Fish-Derived Products Rash     Past Medical History:  Diagnosis Date   Acid reflux    Arthritis    rheumatoid arthritis   Asthma    Carpal tunnel syndrome    LEFT   CHF (congestive heart failure) ([facility])    Coronary artery disease    High cholesterol    Myocardial infarction [facility]    Peripheral vascular disease ([facility])    Pre-diabetes    Pulmonary hypertension ([facility])    Severe mitral regurgitation     Review of systems:  Otherwise negative.    Physical Exam  Gen: Alert, oriented. Appears stated age.  HEENT:PERRLA. Lungs: No respiratory distress CV: RRR Abd: soft, benign, no masses Ext: No edema    Planned procedures: Proceed with colonoscopy. The patient understands the nature of the planned procedure, indications, risks, alternatives and potential complications including but not limited to bleeding, infection, perforation, damage to internal organs and possible oversedation/side effects from anesthesia. The patient agrees and gives consent to proceed.  Please refer to procedure notes for findings, recommendations and patient disposition/instructions.     Lesly Rubenstein, MD [facility]

## 2021-08-17 LAB — SURGICAL PATHOLOGY

## 2021-09-15 ENCOUNTER — Other Ambulatory Visit: Payer: Self-pay | Admitting: Gastroenterology

## 2021-09-15 DIAGNOSIS — K559 Vascular disorder of intestine, unspecified: Secondary | ICD-10-CM

## 2021-10-01 ENCOUNTER — Other Ambulatory Visit: Payer: Medicaid Other

## 2021-10-08 ENCOUNTER — Ambulatory Visit
Admission: RE | Admit: 2021-10-08 | Discharge: 2021-10-08 | Disposition: A | Discharge status: Home / Self Care | Payer: Medicaid Other | Source: Ambulatory Visit | Attending: Gastroenterology | Admitting: Gastroenterology

## 2021-10-08 DIAGNOSIS — K559 Vascular disorder of intestine, unspecified: Secondary | ICD-10-CM | POA: Diagnosis present

## 2021-10-08 LAB — POCT I-STAT CREATININE: Creatinine, Ser: 0.8 mg/dL (ref 0.44–1.00)

## 2021-10-08 MED ORDER — IOHEXOL 350 MG/ML SOLN
100.0000 mL | Freq: Once | INTRAVENOUS | Status: AC | PRN
  Administered 2021-10-08: 100 mL via INTRAVENOUS

## 2021-10-15 DIAGNOSIS — M542 Cervicalgia: Secondary | ICD-10-CM | POA: Insufficient documentation

## 2021-11-01 ENCOUNTER — Encounter (INDEPENDENT_AMBULATORY_CARE_PROVIDER_SITE_OTHER): Payer: Self-pay

## 2021-11-08 ENCOUNTER — Other Ambulatory Visit
Admission: RE | Admit: 2021-11-08 | Discharge: 2021-11-08 | Disposition: A | Discharge status: Home / Self Care | Payer: Medicaid Other | Source: Ambulatory Visit | Attending: Internal Medicine | Admitting: Internal Medicine

## 2021-11-08 DIAGNOSIS — R791 Abnormal coagulation profile: Secondary | ICD-10-CM | POA: Diagnosis present

## 2021-11-08 LAB — PROTIME-INR
INR: 7 (ref 0.8–1.2)
Prothrombin Time: 59.7 seconds — ABNORMAL HIGH (ref 11.4–15.2)

## 2021-12-10 ENCOUNTER — Ambulatory Visit: Payer: Medicaid Other | Admitting: Podiatry

## 2021-12-10 ENCOUNTER — Encounter: Payer: Self-pay | Admitting: Podiatry

## 2021-12-10 ENCOUNTER — Ambulatory Visit (INDEPENDENT_AMBULATORY_CARE_PROVIDER_SITE_OTHER): Payer: Medicaid Other

## 2021-12-10 DIAGNOSIS — M779 Enthesopathy, unspecified: Secondary | ICD-10-CM

## 2021-12-10 DIAGNOSIS — S90852A Superficial foreign body, left foot, initial encounter: Secondary | ICD-10-CM | POA: Diagnosis not present

## 2021-12-12 NOTE — Progress Notes (Signed)
Subjective:   Patient ID: Theresa Phelps, female   DOB: 61 y.o.   MRN: [id number]   HPI Patient states she stepped on a piece of glass 3 days ago and is just concerned because she does have diabetes not in good control with moderate obesity.  States she cannot see her feet states that she does get some numbness and burning in the entire bottom of her feet but is not noticing any redness drainage with no changes in her sugar.  Patient does not smoke likes to be active   Review of Systems  All other systems reviewed and are negative.       Objective:  Physical Exam Vitals and nursing note reviewed.  Constitutional:      Appearance: She is well-developed.  Pulmonary:     Effort: Pulmonary effort is normal.  Musculoskeletal:        General: Normal range of motion.  Skin:    General: Skin is warm.  Neurological:     Mental Status: She is alert.     Neurovascular status found to be intact muscle strength was found to be within normal limits range of motion adequate of the subtalar midtarsal joint.  Patient is found to have a small area on the plantar right does not appear to have invaded skin but there is some slight tissue formation no erythema edema drainage noted     Assessment:  Appears to be more inflammatory with irritation I do not see signs of infection currently H&P     Plan:  Reviewed condition and reviewed with her her diabetes and daily inspections of her feet.  I did debride some tissue I did not note any drainage advised on soaks and if any swelling erythema edema or other pathology were to occur reappoint immediately  X-rays were negative for signs of foreign body or other pathology

## 2021-12-14 ENCOUNTER — Other Ambulatory Visit: Payer: Self-pay | Admitting: Podiatry

## 2021-12-14 DIAGNOSIS — S90852A Superficial foreign body, left foot, initial encounter: Secondary | ICD-10-CM

## 2021-12-14 DIAGNOSIS — M779 Enthesopathy, unspecified: Secondary | ICD-10-CM

## 2021-12-20 ENCOUNTER — Other Ambulatory Visit (INDEPENDENT_AMBULATORY_CARE_PROVIDER_SITE_OTHER): Payer: Self-pay | Admitting: Nurse Practitioner

## 2021-12-20 DIAGNOSIS — I739 Peripheral vascular disease, unspecified: Secondary | ICD-10-CM

## 2021-12-20 DIAGNOSIS — I6523 Occlusion and stenosis of bilateral carotid arteries: Secondary | ICD-10-CM

## 2021-12-21 ENCOUNTER — Ambulatory Visit (INDEPENDENT_AMBULATORY_CARE_PROVIDER_SITE_OTHER): Payer: Medicaid Other | Admitting: Nurse Practitioner

## 2021-12-21 ENCOUNTER — Ambulatory Visit (INDEPENDENT_AMBULATORY_CARE_PROVIDER_SITE_OTHER): Payer: Medicaid Other

## 2021-12-21 ENCOUNTER — Encounter (INDEPENDENT_AMBULATORY_CARE_PROVIDER_SITE_OTHER): Payer: Self-pay | Admitting: Nurse Practitioner

## 2021-12-21 ENCOUNTER — Other Ambulatory Visit (INDEPENDENT_AMBULATORY_CARE_PROVIDER_SITE_OTHER): Payer: Self-pay | Admitting: Vascular Surgery

## 2021-12-21 VITALS — BP 176/75 | HR 60 | Resp 16 | Wt 218.0 lb

## 2021-12-21 DIAGNOSIS — I739 Peripheral vascular disease, unspecified: Secondary | ICD-10-CM | POA: Diagnosis not present

## 2021-12-21 DIAGNOSIS — F17200 Nicotine dependence, unspecified, uncomplicated: Secondary | ICD-10-CM

## 2021-12-21 DIAGNOSIS — R6889 Other general symptoms and signs: Secondary | ICD-10-CM

## 2021-12-21 DIAGNOSIS — I70222 Atherosclerosis of native arteries of extremities with rest pain, left leg: Secondary | ICD-10-CM

## 2021-12-21 DIAGNOSIS — Z9889 Other specified postprocedural states: Secondary | ICD-10-CM

## 2021-12-21 DIAGNOSIS — I6523 Occlusion and stenosis of bilateral carotid arteries: Secondary | ICD-10-CM | POA: Diagnosis not present

## 2021-12-21 DIAGNOSIS — E785 Hyperlipidemia, unspecified: Secondary | ICD-10-CM

## 2021-12-22 ENCOUNTER — Encounter (INDEPENDENT_AMBULATORY_CARE_PROVIDER_SITE_OTHER): Payer: Self-pay | Admitting: Nurse Practitioner

## 2021-12-22 NOTE — H&P (View-Only) (Signed)
Subjective:    Patient ID: Theresa Phelps, female    DOB: [birth year], 61 y.o.   MRN: [id number] Chief Complaint  Patient presents with   Follow-up    Left leg pain    The patient returns to the office for followup and review of the noninvasive studies.   The patient notes that there has been a significant deterioration in the lower extremity symptoms.  This started over the last 2 weeks.  The patient notes interval shortening of their claudication distance and development of severe rest pain symptoms. No new ulcers or wounds have occurred since the last visit.  There have been no significant changes to the patient's overall health care.  The patient denies amaurosis fugax or recent TIA symptoms. There are no recent neurological changes noted. There is no history of DVT, PE or superficial thrombophlebitis. The patient denies recent episodes of angina or shortness of breath.   ABI's Rt=1.01 and Lt=0.48 (previous ABI's Rt=1.01 and Lt=1.01) Duplex US of the left lower extremity arterial system shows an occlusion of the left SFA.  Dampened monophasic flow seen in the tibial vessels.  Absent toe waveforms    Review of Systems  Cardiovascular:  Positive for leg swelling.       Rest pain  All other systems reviewed and are negative.      Objective:   Physical Exam Vitals reviewed.  HENT:     Head: Normocephalic.  Cardiovascular:     Rate and Rhythm: Normal rate.     Pulses:          Dorsalis pedis pulses are 0 on the right side and detected w/ Doppler on the left side.       Posterior tibial pulses are 0 on the right side and detected w/ Doppler on the left side.  Pulmonary:     Effort: Pulmonary effort is normal.  Skin:    General: Skin is warm and dry.  Neurological:     Mental Status: She is alert and oriented to person, place, and time.  Psychiatric:        Mood and Affect: Mood normal.        Behavior: Behavior normal.        Thought Content: Thought content normal.         Judgment: Judgment normal.     BP (!) 176/75 (BP Location: Right Arm)   Pulse 60   Resp 16   Wt 218 lb (98.9 kg)   BMI 35.19 kg/m   Past Medical History:  Diagnosis Date   Acid reflux    Arthritis    rheumatoid arthritis   Asthma    Carpal tunnel syndrome    LEFT   CHF (congestive heart failure) (HCC)    Coronary artery disease    High cholesterol    Myocardial infarction ([place])    Peripheral vascular disease (HCC)    Pre-diabetes    Pulmonary hypertension (HCC)    Severe mitral regurgitation     Social History   Socioeconomic History   Marital status: Single    Spouse name: Not on file   Number of children: Not on file   Years of education: Not on file   Highest education level: Not on file  Occupational History   Not on file  Tobacco Use   Smoking status: Former    Packs/day: 0.50    Years: 20.00    Total pack years: 10.00    Types: Cigarettes    Quit  date: 12/2018    Years since quitting: 3.0   Smokeless tobacco: Never  Vaping Use   Vaping Use: Never used  Substance and Sexual Activity   Alcohol use: Yes    Alcohol/week: 3.0 standard drinks of alcohol    Types: 3 Cans of beer per week    Comment: twice week. NONE THIS WEEK   Drug use: No   Sexual activity: Yes    Birth control/protection: Post-menopausal  Other Topics Concern   Not on file  Social History Narrative   Not on file   Social Determinants of Health   Financial Resource Strain: Not on file  Food Insecurity: Not on file  Transportation Needs: Not on file  Physical Activity: Not on file  Stress: Not on file  Social Connections: Not on file  Intimate Partner Violence: Not on file    Past Surgical History:  Procedure Laterality Date   BREAST BIOPSY Left 01/04/2012   CARDIAC VALVE REPLACEMENT     COLONOSCOPY WITH PROPOFOL N/A 04/30/2021   Procedure: COLONOSCOPY WITH PROPOFOL;  Surgeon: Lesly Rubenstein, MD;  Location: [facility];  Service: Endoscopy;  Laterality:  N/A;   COLONOSCOPY WITH PROPOFOL N/A 08/13/2021   Procedure: COLONOSCOPY WITH PROPOFOL;  Surgeon: Lesly Rubenstein, MD;  Location: [facility];  Service: Endoscopy;  Laterality: N/A;   CORONARY ARTERY BYPASS GRAFT  12/13/2017   replacement mitral valve by Dr. Evelina Dun   INCISION AND DRAINAGE ABSCESS Right 09/04/2020   Procedure: INCISION AND DRAINAGE ABSCESS-Perianal;  Surgeon: Herbert Pun, MD;  Location: [facility];  Service: General;  Laterality: Right;   LOWER EXTREMITY ANGIOGRAPHY Left 05/28/2015   Procedure: Lower Extremity Angiography;  Surgeon: Algernon Huxley, MD;  Location: [facility];  Service: Cardiovascular;  Laterality: Left;   PERIPHERAL VASCULAR CATHETERIZATION Right 07/16/2015   Procedure: Lower Extremity Angiography;  Surgeon: Algernon Huxley, MD;  Location: [place] [facility];  Service: Cardiovascular;  Laterality: Right;   PERIPHERAL VASCULAR CATHETERIZATION  07/16/2015   Procedure: Lower Extremity Intervention;  Surgeon: Algernon Huxley, MD;  Location: [place] [facility];  Service: Cardiovascular;;   RIGHT/LEFT HEART CATH AND CORONARY ANGIOGRAPHY Bilateral 11/07/2017   Procedure: RIGHT/LEFT HEART CATH AND CORONARY ANGIOGRAPHY;  Surgeon: Corey Skains, MD;  Location: [place] [facility];  Service: Cardiovascular;  Laterality: Bilateral;   TEE WITHOUT CARDIOVERSION N/A 11/07/2017   Procedure: TRANSESOPHAGEAL ECHOCARDIOGRAM (TEE);  Surgeon: Corey Skains, MD;  Location: [facility];  Service: Cardiovascular;  Laterality: N/A;   WISDOM TOOTH EXTRACTION      Family History  Problem Relation Age of Onset   Hypertension Mother    AAA (abdominal aortic aneurysm) Father    Other Father        bowel obstruction    Allergies  Allergen Reactions   Shellfish Allergy Anaphylaxis   Banana     Other reaction(s): Other (See Comments) Burning in the mouth   Garlic     Other reaction(s): Other (See Comments) Pt states arms freeze up and can't talk but no SOB    Isosorbide Nitrate Itching    Blurred vision   Fish-Derived Products Rash       Latest Ref Rng & Units 01/28/2021    6:05 PM 09/07/2020    4:50 AM 09/06/2020    5:32 AM  CBC  WBC 4.0 - 10.5 K/uL 7.8  12.6  19.1   Hemoglobin 12.0 - 15.0 g/dL 15.6  11.4  10.4   Hematocrit 36.0 - 46.0 %  48.9  34.2  31.4   Platelets 150 - 400 K/uL 246  260  211       CMP     Component Value Date/Time   NA 137 01/28/2021 1805   NA 138 01/20/2013 2320   K 4.1 01/28/2021 1805   K 4.1 01/20/2013 2320   CL 106 01/28/2021 1805   CL 108 (H) 01/20/2013 2320   CO2 21 (L) 01/28/2021 1805   CO2 20 (L) 01/20/2013 2320   GLUCOSE 83 01/28/2021 1805   GLUCOSE 165 (H) 01/20/2013 2320   BUN 10 01/28/2021 1805   BUN 17 01/20/2013 2320   CREATININE 0.80 10/08/2021 1428   CREATININE 1.14 01/20/2013 2320   CALCIUM 9.0 01/28/2021 1805   CALCIUM 9.2 01/20/2013 2320   PROT 6.8 09/03/2020 1400   PROT 8.5 (H) 01/20/2013 2320   ALBUMIN 2.9 (L) 09/03/2020 1400   ALBUMIN 3.7 01/20/2013 2320   AST 22 09/03/2020 1400   AST 14 (L) 01/20/2013 2320   ALT 15 09/03/2020 1400   ALT 22 01/20/2013 2320   ALKPHOS 75 09/03/2020 1400   ALKPHOS 105 01/20/2013 2320   BILITOT 1.1 09/03/2020 1400   BILITOT 0.4 01/20/2013 2320   GFRNONAA >60 01/28/2021 1805   GFRNONAA 55 (L) 01/20/2013 2320   GFRAA >60 07/15/2015 1247   GFRAA >60 01/20/2013 2320     VAS [place] ABI WITH/WO TBI  Result Date: 12/21/2021  LOWER EXTREMITY DOPPLER STUDY Patient Name:  SUMIRE HALBLEIB  Date of Exam:   12/21/2021 Medical Rec #: [id number]         Accession #:    [id number] Date of Birth: 1960/02/21         Patient Gender: F Patient Age:   45 years Exam Location:  [facility] Procedure:      VAS [place] ABI WITH/WO TBI Referring Phys: [name] --------------------------------------------------------------------------------  Indications: Peripheral artery disease. C/O left leg pain  Vascular Interventions: 05/28/15: Left SFA/popliteal PTA/stent;                          07/16/15: Right SFA PTA/stent with right popliteal PTA;. Performing Technologist: Almira Coaster RVS  Examination Guidelines: A complete evaluation includes at minimum, Doppler waveform signals and systolic blood pressure reading at the level of bilateral brachial, anterior tibial, and posterior tibial arteries, when vessel segments are accessible. Bilateral testing is considered an integral part of a complete examination. Photoelectric Plethysmograph (PPG) waveforms and toe systolic pressure readings are included as required and additional duplex testing as needed. Limited examinations for reoccurring indications may be performed as noted.  ABI Findings: +---------+------------------+-----+--------+-------+ Right    Rt Pressure (mmHg)IndexWaveformComment +---------+------------------+-----+--------+-------+ Brachial 161                                    +---------+------------------+-----+--------+-------+ PTA      123               0.76 biphasic        +---------+------------------+-----+--------+-------+ DP       163               1.01 biphasic        +---------+------------------+-----+--------+-------+ Great Toe109               0.68 Abnormal        +---------+------------------+-----+--------+-------+ +---------+------------------+-----+-------------------+-------+ Left     Lt Pressure (mmHg)IndexWaveform  Comment +---------+------------------+-----+-------------------+-------+ Brachial 161                                               +---------+------------------+-----+-------------------+-------+ PTA      69                0.43 monophasic                 +---------+------------------+-----+-------------------+-------+ DP       77                0.48 dampened monophasic        +---------+------------------+-----+-------------------+-------+ Great Toe0                 0.00 Absent                      +---------+------------------+-----+-------------------+-------+ +-------+-----------+-----------+------------+------------+ ABI/TBIToday's ABIToday's TBIPrevious ABIPrevious TBI +-------+-----------+-----------+------------+------------+ Right  1.01       0.68       1.01        0.92         +-------+-----------+-----------+------------+------------+ Left   0.48       0.0        1.01        0.94         +-------+-----------+-----------+------------+------------+ Left ABIs and TBIs appear decreased compared to prior study on 02/12/2020. Right ABIs appear essentially unchanged.  Summary: Right: Resting right ankle-brachial index is within normal range. The right toe-brachial index is normal. Left: Resting left ankle-brachial index indicates severe left lower extremity arterial disease. Flow in the left great toe was not adequately detected.  *See table(s) above for measurements and observations. Electronically signed by [name] MD on 12/21/2021 at 4:24:05 PM.    Final        Assessment & Plan:   1. Atherosclerosis of native artery of left lower extremity with rest pain (HCC) Recommend:  The patient has evidence of severe atherosclerotic changes of both lower extremities with rest pain that is associated with preulcerative changes and impending tissue loss of the left foot.  This represents a limb threatening ischemia and places the patient at the risk for left limb loss.  Patient should undergo angiography of the left lower extremity with the hope for intervention for limb salvage.  The risks and benefits as well as the alternative therapies was discussed in detail with the patient.  All questions were answered.  Patient agrees to proceed with left lower extremity angiography.  The patient will follow up with me in the office after the procedure.      2. Bilateral carotid artery stenosis Recommend:  Given the patient's asymptomatic subcritical stenosis no further invasive  testing or surgery at this time.  Duplex ultrasound shows <40% stenosis bilaterally.  Continue antiplatelet therapy as prescribed Continue management of CAD, HTN and Hyperlipidemia Healthy heart diet,  encouraged exercise at least 4 times per week Follow up in 12 months with duplex ultrasound and physical exam   3. Tobacco use disorder Patient has quit.  Continue abstinence is encouraged  4. Hyperlipidemia, unspecified hyperlipidemia type Continue statin as ordered and reviewed, no changes at this time   Current Outpatient Medications on File Prior to Visit  Medication Sig Dispense Refill   acetaminophen (TYLENOL) 500 MG tablet Take 2 tablets (1,000 mg total) by mouth every 6 (six) hours as  needed. 30 tablet 0   atorvastatin (LIPITOR) 80 MG tablet Take 80 mg by mouth daily.     benzonatate (TESSALON) 200 MG capsule Take 1 capsule (200 mg total) by mouth 2 (two) times daily as needed for cough. 30 capsule 0   EPINEPHrine 0.3 mg/0.3 mL IJ SOAJ injection Inject into the muscle as directed.     ezetimibe (ZETIA) 10 MG tablet Take 10 mg by mouth daily.     furosemide (LASIX) 40 MG tablet furosemide 40 mg tablet     gabapentin (NEURONTIN) 100 MG capsule Take 100 mg by mouth 2 (two) times daily.     loratadine (CLARITIN) 10 MG tablet Take 10 mg by mouth daily.     nitroGLYCERIN (NITROSTAT) 0.4 MG SL tablet Place 0.4 mg under the tongue every 5 (five) minutes as needed for chest pain.     warfarin (COUMADIN) 6 MG tablet Take 6 mg by mouth at bedtime.     nitroGLYCERIN (NITROSTAT) 0.4 MG SL tablet Place under the tongue.     [DISCONTINUED] cetirizine (ZYRTEC) 10 MG tablet Take 10 mg by mouth daily. Reported on 05/28/2015     [DISCONTINUED] fluticasone (FLONASE) 50 MCG/ACT nasal spray Place 2 sprays into both nostrils daily. 16 g 0   No current facility-administered medications on file prior to visit.    There are no Patient Instructions on file for this visit. No follow-ups on  file.   Kris Hartmann, NP

## 2021-12-22 NOTE — Progress Notes (Signed)
Subjective:    Patient ID: Theresa Phelps, female    DOB: 1960/08/26, 61 y.o.   MRN: [id number] Chief Complaint  Patient presents with   Follow-up    Left leg pain    The patient returns to the office for followup and review of the noninvasive studies.   The patient notes that there has been a significant deterioration in the lower extremity symptoms.  This started over the last 2 weeks.  The patient notes interval shortening of their claudication distance and development of severe rest pain symptoms. No new ulcers or wounds have occurred since the last visit.  There have been no significant changes to the patient's overall health care.  The patient denies amaurosis fugax or recent TIA symptoms. There are no recent neurological changes noted. There is no history of DVT, PE or superficial thrombophlebitis. The patient denies recent episodes of angina or shortness of breath.   ABI's Rt=1.01 and Lt=0.48 (previous ABI's Rt=1.01 and Lt=1.01) Duplex US of the left lower extremity arterial system shows an occlusion of the left SFA.  Dampened monophasic flow seen in the tibial vessels.  Absent toe waveforms    Review of Systems  Cardiovascular:  Positive for leg swelling.       Rest pain  All other systems reviewed and are negative.      Objective:   Physical Exam Vitals reviewed.  HENT:     Head: Normocephalic.  Cardiovascular:     Rate and Rhythm: Normal rate.     Pulses:          Dorsalis pedis pulses are 0 on the right side and detected w/ Doppler on the left side.       Posterior tibial pulses are 0 on the right side and detected w/ Doppler on the left side.  Pulmonary:     Effort: Pulmonary effort is normal.  Skin:    General: Skin is warm and dry.  Neurological:     Mental Status: She is alert and oriented to person, place, and time.  Psychiatric:        Mood and Affect: Mood normal.        Behavior: Behavior normal.        Thought Content: Thought content normal.         Judgment: Judgment normal.     BP (!) 176/75 (BP Location: Right Arm)   Pulse 60   Resp 16   Wt 218 lb (98.9 kg)   BMI 35.19 kg/m   Past Medical History:  Diagnosis Date   Acid reflux    Arthritis    rheumatoid arthritis   Asthma    Carpal tunnel syndrome    LEFT   CHF (congestive heart failure) (HCC)    Coronary artery disease    High cholesterol    Myocardial infarction ([place])    Peripheral vascular disease (HCC)    Pre-diabetes    Pulmonary hypertension (HCC)    Severe mitral regurgitation     Social History   Socioeconomic History   Marital status: Single    Spouse name: Not on file   Number of children: Not on file   Years of education: Not on file   Highest education level: Not on file  Occupational History   Not on file  Tobacco Use   Smoking status: Former    Packs/day: 0.50    Years: 20.00    Total pack years: 10.00    Types: Cigarettes    Quit  date: 12/2018    Years since quitting: 3.0   Smokeless tobacco: Never  Vaping Use   Vaping Use: Never used  Substance and Sexual Activity   Alcohol use: Yes    Alcohol/week: 3.0 standard drinks of alcohol    Types: 3 Cans of beer per week    Comment: twice week. NONE THIS WEEK   Drug use: No   Sexual activity: Yes    Birth control/protection: Post-menopausal  Other Topics Concern   Not on file  Social History Narrative   Not on file   Social Determinants of Health   Financial Resource Strain: Not on file  Food Insecurity: Not on file  Transportation Needs: Not on file  Physical Activity: Not on file  Stress: Not on file  Social Connections: Not on file  Intimate Partner Violence: Not on file    Past Surgical History:  Procedure Laterality Date   BREAST BIOPSY Left 01/04/2012   CARDIAC VALVE REPLACEMENT     COLONOSCOPY WITH PROPOFOL N/A 04/30/2021   Procedure: COLONOSCOPY WITH PROPOFOL;  Surgeon: Lesly Rubenstein, MD;  Location: [facility];  Service: Endoscopy;  Laterality:  N/A;   COLONOSCOPY WITH PROPOFOL N/A 08/13/2021   Procedure: COLONOSCOPY WITH PROPOFOL;  Surgeon: Lesly Rubenstein, MD;  Location: [facility];  Service: Endoscopy;  Laterality: N/A;   CORONARY ARTERY BYPASS GRAFT  12/13/2017   replacement mitral valve by Dr. Evelina Dun   INCISION AND DRAINAGE ABSCESS Right 09/04/2020   Procedure: INCISION AND DRAINAGE ABSCESS-Perianal;  Surgeon: Herbert Pun, MD;  Location: [facility];  Service: General;  Laterality: Right;   LOWER EXTREMITY ANGIOGRAPHY Left 05/28/2015   Procedure: Lower Extremity Angiography;  Surgeon: Algernon Huxley, MD;  Location: [place] [facility];  Service: Cardiovascular;  Laterality: Left;   PERIPHERAL VASCULAR CATHETERIZATION Right 07/16/2015   Procedure: Lower Extremity Angiography;  Surgeon: Algernon Huxley, MD;  Location: [place] [facility];  Service: Cardiovascular;  Laterality: Right;   PERIPHERAL VASCULAR CATHETERIZATION  07/16/2015   Procedure: Lower Extremity Intervention;  Surgeon: Algernon Huxley, MD;  Location: [place] [facility];  Service: Cardiovascular;;   RIGHT/LEFT HEART CATH AND CORONARY ANGIOGRAPHY Bilateral 11/07/2017   Procedure: RIGHT/LEFT HEART CATH AND CORONARY ANGIOGRAPHY;  Surgeon: Corey Skains, MD;  Location: [place] [facility];  Service: Cardiovascular;  Laterality: Bilateral;   TEE WITHOUT CARDIOVERSION N/A 11/07/2017   Procedure: TRANSESOPHAGEAL ECHOCARDIOGRAM (TEE);  Surgeon: Corey Skains, MD;  Location: [facility];  Service: Cardiovascular;  Laterality: N/A;   WISDOM TOOTH EXTRACTION      Family History  Problem Relation Age of Onset   Hypertension Mother    AAA (abdominal aortic aneurysm) Father    Other Father        bowel obstruction    Allergies  Allergen Reactions   Shellfish Allergy Anaphylaxis   Banana     Other reaction(s): Other (See Comments) Burning in the mouth   Garlic     Other reaction(s): Other (See Comments) Pt states arms freeze up and can't talk but no SOB    Isosorbide Nitrate Itching    Blurred vision   Fish-Derived Products Rash       Latest Ref Rng & Units 01/28/2021    6:05 PM 09/07/2020    4:50 AM 09/06/2020    5:32 AM  CBC  WBC 4.0 - 10.5 K/uL 7.8  12.6  19.1   Hemoglobin 12.0 - 15.0 g/dL 15.6  11.4  10.4   Hematocrit 36.0 - 46.0 %  48.9  34.2  31.4   Platelets 150 - 400 K/uL 246  260  211       CMP     Component Value Date/Time   NA 137 01/28/2021 1805   NA 138 01/20/2013 2320   K 4.1 01/28/2021 1805   K 4.1 01/20/2013 2320   CL 106 01/28/2021 1805   CL 108 (H) 01/20/2013 2320   CO2 21 (L) 01/28/2021 1805   CO2 20 (L) 01/20/2013 2320   GLUCOSE 83 01/28/2021 1805   GLUCOSE 165 (H) 01/20/2013 2320   BUN 10 01/28/2021 1805   BUN 17 01/20/2013 2320   CREATININE 0.80 10/08/2021 1428   CREATININE 1.14 01/20/2013 2320   CALCIUM 9.0 01/28/2021 1805   CALCIUM 9.2 01/20/2013 2320   PROT 6.8 09/03/2020 1400   PROT 8.5 (H) 01/20/2013 2320   ALBUMIN 2.9 (L) 09/03/2020 1400   ALBUMIN 3.7 01/20/2013 2320   AST 22 09/03/2020 1400   AST 14 (L) 01/20/2013 2320   ALT 15 09/03/2020 1400   ALT 22 01/20/2013 2320   ALKPHOS 75 09/03/2020 1400   ALKPHOS 105 01/20/2013 2320   BILITOT 1.1 09/03/2020 1400   BILITOT 0.4 01/20/2013 2320   GFRNONAA >60 01/28/2021 1805   GFRNONAA 55 (L) 01/20/2013 2320   GFRAA >60 07/15/2015 1247   GFRAA >60 01/20/2013 2320     VAS [place] ABI WITH/WO TBI  Result Date: 12/21/2021  LOWER EXTREMITY DOPPLER STUDY Patient Name:  SCOTLYNN NOYES  Date of Exam:   12/21/2021 Medical Rec #: [id number]         Accession #:    [id number] Date of Birth: 08-02-1960         Patient Gender: F Patient Age:   56 years Exam Location:  [facility] Procedure:      VAS [place] ABI WITH/WO TBI Referring Phys: [name] --------------------------------------------------------------------------------  Indications: Peripheral artery disease. C/O left leg pain  Vascular Interventions: 05/28/15: Left SFA/popliteal PTA/stent;                          07/16/15: Right SFA PTA/stent with right popliteal PTA;. Performing Technologist: Almira Coaster RVS  Examination Guidelines: A complete evaluation includes at minimum, Doppler waveform signals and systolic blood pressure reading at the level of bilateral brachial, anterior tibial, and posterior tibial arteries, when vessel segments are accessible. Bilateral testing is considered an integral part of a complete examination. Photoelectric Plethysmograph (PPG) waveforms and toe systolic pressure readings are included as required and additional duplex testing as needed. Limited examinations for reoccurring indications may be performed as noted.  ABI Findings: +---------+------------------+-----+--------+-------+ Right    Rt Pressure (mmHg)IndexWaveformComment +---------+------------------+-----+--------+-------+ Brachial 161                                    +---------+------------------+-----+--------+-------+ PTA      123               0.76 biphasic        +---------+------------------+-----+--------+-------+ DP       163               1.01 biphasic        +---------+------------------+-----+--------+-------+ Great Toe109               0.68 Abnormal        +---------+------------------+-----+--------+-------+ +---------+------------------+-----+-------------------+-------+ Left     Lt Pressure (mmHg)IndexWaveform  Comment +---------+------------------+-----+-------------------+-------+ Brachial 161                                               +---------+------------------+-----+-------------------+-------+ PTA      69                0.43 monophasic                 +---------+------------------+-----+-------------------+-------+ DP       77                0.48 dampened monophasic        +---------+------------------+-----+-------------------+-------+ Great Toe0                 0.00 Absent                      +---------+------------------+-----+-------------------+-------+ +-------+-----------+-----------+------------+------------+ ABI/TBIToday's ABIToday's TBIPrevious ABIPrevious TBI +-------+-----------+-----------+------------+------------+ Right  1.01       0.68       1.01        0.92         +-------+-----------+-----------+------------+------------+ Left   0.48       0.0        1.01        0.94         +-------+-----------+-----------+------------+------------+ Left ABIs and TBIs appear decreased compared to prior study on 02/12/2020. Right ABIs appear essentially unchanged.  Summary: Right: Resting right ankle-brachial index is within normal range. The right toe-brachial index is normal. Left: Resting left ankle-brachial index indicates severe left lower extremity arterial disease. Flow in the left great toe was not adequately detected.  *See table(s) above for measurements and observations. Electronically signed by [name] MD on 12/21/2021 at 4:24:05 PM.    Final        Assessment & Plan:   1. Atherosclerosis of native artery of left lower extremity with rest pain (HCC) Recommend:  The patient has evidence of severe atherosclerotic changes of both lower extremities with rest pain that is associated with preulcerative changes and impending tissue loss of the left foot.  This represents a limb threatening ischemia and places the patient at the risk for left limb loss.  Patient should undergo angiography of the left lower extremity with the hope for intervention for limb salvage.  The risks and benefits as well as the alternative therapies was discussed in detail with the patient.  All questions were answered.  Patient agrees to proceed with left lower extremity angiography.  The patient will follow up with me in the office after the procedure.      2. Bilateral carotid artery stenosis Recommend:  Given the patient's asymptomatic subcritical stenosis no further invasive  testing or surgery at this time.  Duplex ultrasound shows <40% stenosis bilaterally.  Continue antiplatelet therapy as prescribed Continue management of CAD, HTN and Hyperlipidemia Healthy heart diet,  encouraged exercise at least 4 times per week Follow up in 12 months with duplex ultrasound and physical exam   3. Tobacco use disorder Patient has quit.  Continue abstinence is encouraged  4. Hyperlipidemia, unspecified hyperlipidemia type Continue statin as ordered and reviewed, no changes at this time   Current Outpatient Medications on File Prior to Visit  Medication Sig Dispense Refill   acetaminophen (TYLENOL) 500 MG tablet Take 2 tablets (1,000 mg total) by mouth every 6 (six) hours as  needed. 30 tablet 0   atorvastatin (LIPITOR) 80 MG tablet Take 80 mg by mouth daily.     benzonatate (TESSALON) 200 MG capsule Take 1 capsule (200 mg total) by mouth 2 (two) times daily as needed for cough. 30 capsule 0   EPINEPHrine 0.3 mg/0.3 mL IJ SOAJ injection Inject into the muscle as directed.     ezetimibe (ZETIA) 10 MG tablet Take 10 mg by mouth daily.     furosemide (LASIX) 40 MG tablet furosemide 40 mg tablet     gabapentin (NEURONTIN) 100 MG capsule Take 100 mg by mouth 2 (two) times daily.     loratadine (CLARITIN) 10 MG tablet Take 10 mg by mouth daily.     nitroGLYCERIN (NITROSTAT) 0.4 MG SL tablet Place 0.4 mg under the tongue every 5 (five) minutes as needed for chest pain.     warfarin (COUMADIN) 6 MG tablet Take 6 mg by mouth at bedtime.     nitroGLYCERIN (NITROSTAT) 0.4 MG SL tablet Place under the tongue.     [DISCONTINUED] cetirizine (ZYRTEC) 10 MG tablet Take 10 mg by mouth daily. Reported on 05/28/2015     [DISCONTINUED] fluticasone (FLONASE) 50 MCG/ACT nasal spray Place 2 sprays into both nostrils daily. 16 g 0   No current facility-administered medications on file prior to visit.    There are no Patient Instructions on file for this visit. No follow-ups on  file.   Kris Hartmann, NP

## 2021-12-28 ENCOUNTER — Ambulatory Visit
Admission: RE | Admit: 2021-12-28 | Discharge: 2021-12-28 | Disposition: A | Discharge status: Home / Self Care | Payer: Medicaid Other | Attending: Vascular Surgery | Admitting: Vascular Surgery

## 2021-12-28 ENCOUNTER — Other Ambulatory Visit: Payer: Self-pay

## 2021-12-28 ENCOUNTER — Encounter
Admission: RE | Disposition: A | Discharge status: Home / Self Care | Payer: Self-pay | Source: Home / Self Care | Attending: Vascular Surgery

## 2021-12-28 ENCOUNTER — Encounter: Payer: Self-pay | Admitting: Vascular Surgery

## 2021-12-28 DIAGNOSIS — E785 Hyperlipidemia, unspecified: Secondary | ICD-10-CM | POA: Insufficient documentation

## 2021-12-28 DIAGNOSIS — I2510 Atherosclerotic heart disease of native coronary artery without angina pectoris: Secondary | ICD-10-CM | POA: Diagnosis not present

## 2021-12-28 DIAGNOSIS — I743 Embolism and thrombosis of arteries of the lower extremities: Secondary | ICD-10-CM | POA: Diagnosis not present

## 2021-12-28 DIAGNOSIS — I10 Essential (primary) hypertension: Secondary | ICD-10-CM | POA: Insufficient documentation

## 2021-12-28 DIAGNOSIS — I723 Aneurysm of iliac artery: Secondary | ICD-10-CM | POA: Diagnosis not present

## 2021-12-28 DIAGNOSIS — I70222 Atherosclerosis of native arteries of extremities with rest pain, left leg: Secondary | ICD-10-CM | POA: Diagnosis not present

## 2021-12-28 DIAGNOSIS — T82856A Stenosis of peripheral vascular stent, initial encounter: Secondary | ICD-10-CM | POA: Diagnosis not present

## 2021-12-28 DIAGNOSIS — Z87891 Personal history of nicotine dependence: Secondary | ICD-10-CM | POA: Diagnosis not present

## 2021-12-28 DIAGNOSIS — I6523 Occlusion and stenosis of bilateral carotid arteries: Secondary | ICD-10-CM | POA: Insufficient documentation

## 2021-12-28 DIAGNOSIS — I70229 Atherosclerosis of native arteries of extremities with rest pain, unspecified extremity: Secondary | ICD-10-CM

## 2021-12-28 HISTORY — PX: LOWER EXTREMITY ANGIOGRAPHY: CATH118251

## 2021-12-28 LAB — CREATININE, SERUM
Creatinine, Ser: 0.94 mg/dL (ref 0.44–1.00)
GFR, Estimated: >60 mL/min (ref >60)

## 2021-12-28 LAB — BUN: BUN: 13 mg/dL (ref 8–23)

## 2021-12-28 SURGERY — LOWER EXTREMITY ANGIOGRAPHY
Anesthesia: Moderate Sedation | Site: Leg Lower | Laterality: Left

## 2021-12-28 MED ORDER — NITROGLYCERIN 1 MG/10 ML FOR IR/CATH LAB
INTRA_ARTERIAL | Status: AC
  Filled 2021-12-28: qty 10

## 2021-12-28 MED ORDER — MIDAZOLAM HCL 2 MG/2ML IJ SOLN
INTRAMUSCULAR | Status: DC | PRN
  Administered 2021-12-28: 2 mg via INTRAVENOUS
  Administered 2021-12-28: 1 mg via INTRAVENOUS

## 2021-12-28 MED ORDER — METHYLPREDNISOLONE SODIUM SUCC 125 MG IJ SOLR: 125.0000 mg | Freq: Once | INTRAMUSCULAR | Status: AC | PRN

## 2021-12-28 MED ORDER — NITROGLYCERIN 1 MG/10 ML FOR IR/CATH LAB
INTRA_ARTERIAL | Status: DC | PRN
  Administered 2021-12-28: 300 ug via INTRA_ARTERIAL

## 2021-12-28 MED ORDER — DIPHENHYDRAMINE HCL 50 MG/ML IJ SOLN: 50.0000 mg | Freq: Once | INTRAMUSCULAR | Status: AC | PRN

## 2021-12-28 MED ORDER — HYDROMORPHONE HCL 1 MG/ML IJ SOLN: 1.0000 mg | Freq: Once | INTRAMUSCULAR | Status: DC | PRN

## 2021-12-28 MED ORDER — SODIUM CHLORIDE 0.9% FLUSH: 3.0000 mL | Freq: Two times a day (BID) | INTRAVENOUS | Status: DC

## 2021-12-28 MED ORDER — ASPIRIN 81 MG PO CHEW
CHEWABLE_TABLET | ORAL | Status: AC
  Administered 2021-12-28: 81 mg via ORAL
  Filled 2021-12-28: qty 1

## 2021-12-28 MED ORDER — FENTANYL CITRATE (PF) 100 MCG/2ML IJ SOLN
INTRAMUSCULAR | Status: DC | PRN
  Administered 2021-12-28: 25 ug via INTRAVENOUS
  Administered 2021-12-28: 50 ug via INTRAVENOUS

## 2021-12-28 MED ORDER — SODIUM CHLORIDE 0.9 % IV SOLN: INTRAVENOUS | Status: DC

## 2021-12-28 MED ORDER — METHYLPREDNISOLONE SODIUM SUCC 125 MG IJ SOLR
INTRAMUSCULAR | Status: AC
  Administered 2021-12-28: 125 mg via INTRAVENOUS
  Filled 2021-12-28: qty 2

## 2021-12-28 MED ORDER — ASPIRIN 81 MG PO CHEW: 81.0000 mg | CHEWABLE_TABLET | Freq: Once | ORAL | Status: AC

## 2021-12-28 MED ORDER — ASPIRIN 81 MG PO TBEC: 81.0000 mg | DELAYED_RELEASE_TABLET | Freq: Every day | ORAL | Status: DC

## 2021-12-28 MED ORDER — HYDRALAZINE HCL 20 MG/ML IJ SOLN: 5.0000 mg | INTRAMUSCULAR | Status: DC | PRN

## 2021-12-28 MED ORDER — ASPIRIN 81 MG PO TBEC: 81.0000 mg | DELAYED_RELEASE_TABLET | Freq: Every day | ORAL | 2 refills | Status: AC

## 2021-12-28 MED ORDER — CEFAZOLIN SODIUM-DEXTROSE 2-4 GM/100ML-% IV SOLN
2.0000 g | INTRAVENOUS | Status: AC
  Administered 2021-12-28: 2 g via INTRAVENOUS

## 2021-12-28 MED ORDER — SODIUM CHLORIDE 0.9% FLUSH: 3.0000 mL | INTRAVENOUS | Status: DC | PRN

## 2021-12-28 MED ORDER — ONDANSETRON HCL 4 MG/2ML IJ SOLN: 4.0000 mg | Freq: Four times a day (QID) | INTRAMUSCULAR | Status: DC | PRN

## 2021-12-28 MED ORDER — MIDAZOLAM HCL 2 MG/ML PO SYRP: 8.0000 mg | ORAL_SOLUTION | Freq: Once | ORAL | Status: DC | PRN

## 2021-12-28 MED ORDER — ACETAMINOPHEN 325 MG PO TABS: 650.0000 mg | ORAL_TABLET | ORAL | Status: DC | PRN

## 2021-12-28 MED ORDER — FAMOTIDINE 20 MG PO TABS
ORAL_TABLET | ORAL | Status: AC
  Administered 2021-12-28: 40 mg via ORAL
  Filled 2021-12-28: qty 2

## 2021-12-28 MED ORDER — FENTANYL CITRATE PF 50 MCG/ML IJ SOSY
PREFILLED_SYRINGE | INTRAMUSCULAR | Status: AC
  Filled 2021-12-28: qty 2

## 2021-12-28 MED ORDER — FAMOTIDINE 20 MG PO TABS: 40.0000 mg | ORAL_TABLET | Freq: Once | ORAL | Status: AC | PRN

## 2021-12-28 MED ORDER — HEPARIN SODIUM (PORCINE) 1000 UNIT/ML IJ SOLN
INTRAMUSCULAR | Status: AC
  Filled 2021-12-28: qty 10

## 2021-12-28 MED ORDER — MIDAZOLAM HCL 2 MG/2ML IJ SOLN
INTRAMUSCULAR | Status: AC
  Filled 2021-12-28: qty 4

## 2021-12-28 MED ORDER — SODIUM CHLORIDE 0.9 % IV SOLN: 250.0000 mL | INTRAVENOUS | Status: DC | PRN

## 2021-12-28 MED ORDER — DIPHENHYDRAMINE HCL 50 MG/ML IJ SOLN
INTRAMUSCULAR | Status: AC
  Administered 2021-12-28: 50 mg via INTRAVENOUS
  Filled 2021-12-28: qty 1

## 2021-12-28 MED ORDER — LABETALOL HCL 5 MG/ML IV SOLN: 10.0000 mg | INTRAVENOUS | Status: DC | PRN

## 2021-12-28 MED ORDER — IODIXANOL 320 MG/ML IV SOLN
INTRAVENOUS | Status: DC | PRN
  Administered 2021-12-28: 65 mL

## 2021-12-28 SURGICAL SUPPLY — 20 items
BALLN LUTONIX 018 5X300X130 (BALLOONS) ×1
BALLN LUTONIX DCB 7X40X130 (BALLOONS) ×1
BALLN ULTRVRSE 2.5X300X150 (BALLOONS) ×1
BALLOON LUTONIX 018 5X300X130 (BALLOONS) IMPLANT
BALLOON LUTONIX DCB 7X40X130 (BALLOONS) IMPLANT
BALLOON ULTRVRSE 2.5X300X150 (BALLOONS) IMPLANT
CATH BEACON 5 .038 100 VERT TP (CATHETERS) IMPLANT
CATH OMNI FLUSH 5F 65CM (CATHETERS) IMPLANT
CATH ROTAREX 135 6FR (CATHETERS) IMPLANT
DEVICE STARCLOSE SE CLOSURE (Vascular Products) IMPLANT
GLIDEWIRE ADV .035X260CM (WIRE) IMPLANT
KIT ENCORE 26 ADVANTAGE (KITS) IMPLANT
PACK ANGIOGRAPHY (CUSTOM PROCEDURE TRAY) ×1 IMPLANT
SHEATH BRITE TIP 5FRX11 (SHEATH) IMPLANT
SHEATH PINNACLE MP 6F 45CM (SHEATH) IMPLANT
STENT VIABAHN 6X250X120 (Permanent Stent) IMPLANT
SYR MEDRAD MARK 7 150ML (SYRINGE) IMPLANT
TUBING CONTRAST HIGH PRESS 72 (TUBING) IMPLANT
WIRE G V18X300CM (WIRE) IMPLANT
WIRE GUIDERIGHT .035X150 (WIRE) IMPLANT

## 2021-12-28 NOTE — Interval H&P Note (Signed)
History and Physical Interval Note:  12/28/2021 8:14 AM  Theresa Phelps  has presented today for surgery, with the diagnosis of LLE Angio   BARD   ASO w rest pain.  The various methods of treatment have been discussed with the patient and family. After consideration of risks, benefits and other options for treatment, the patient has consented to  Procedure(s): Lower Extremity Angiography (Left) as a surgical intervention.  The patient's history has been reviewed, patient examined, no change in status, stable for surgery.  I have reviewed the patient's chart and labs.  Questions were answered to the patient's satisfaction.     [name]

## 2021-12-28 NOTE — Op Note (Signed)
[facility]  Percutaneous Study/Intervention Procedural Note   Date of Surgery: 12/28/2021  Surgeon(s):Jahden Schara    Assistants:none  Pre-operative Diagnosis: PAD with rest Phelps LLE  Post-operative diagnosis:  Same  Procedure(s) Performed:             1.  Ultrasound guidance for vascular access right femoral artery             2.  Catheter placement into left common femoral artery from right femoral approach             3.  Aortogram and selective left lower extremity angiogram             4.  Percutaneous transluminal angioplasty of left posterior tibial and tibioperoneal trunk with 2.5 mm diameter by 30 cm length angioplasty balloon             5.  mechanical thrombectomy of left SFA and popliteal arteries with the Rotorex device  6.  Stent placement x 2 to the left SFA and popliteal arteries with a pair of 6 mm diameter by 25 cm length Viabahn stents  7.  Angioplasty of the left external iliac artery with 7 mm diameter by 4 cm length Lutonix drug-coated angioplasty balloon            8.  StarClose closure device right femoral artery  EBL: 100 cc  Contrast: 65 cc  Fluoro Time: 10 minutes  Moderate Conscious Sedation Time: approximately 47 minutes using 3 mg of Versed and 75 mcg of Fentanyl              Indications:  Patient is a 61 y.o.female with a long history of peripheral arterial disease status post bilateral lower extremity interventions with recurrent rest Phelps of the left leg. The patient has noninvasive study showing normal right ABI but a markedly reduced left ABI of 0.48 with occlusion of the previous SFA and popliteal stents. The patient is brought in for angiography for further evaluation and potential treatment.  Due to the limb threatening nature of the situation, angiogram was performed for attempted limb salvage. The patient is aware that if the procedure fails, amputation would be expected.  The patient also understands that even with  successful revascularization, amputation may still be required due to the severity of the situation.  Risks and benefits are discussed and informed consent is obtained.   Procedure:  The patient was identified and appropriate procedural time out was performed.  The patient was then placed supine on the table and prepped and draped in the usual sterile fashion. Moderate conscious sedation was administered during a face to face encounter with the patient throughout the procedure with my supervision of the RN administering medicines and monitoring the patient's vital signs, pulse oximetry, telemetry and mental status throughout from the start of the procedure until the patient was taken to the recovery room. Ultrasound was used to evaluate the right common femoral artery.  It was patent .  A digital ultrasound image was acquired.  A Seldinger needle was used to access the right common femoral artery under direct ultrasound guidance and a permanent image was performed.  A 0.035 J wire was advanced without resistance and a 5Fr sheath was placed.  Pigtail catheter was placed into the aorta and an AP aortogram was performed. 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  The left common iliac  artery was fairly normal but at the distal left external iliac artery was a napkin ring like stenosis in the 80% range. I then crossed the aortic bifurcation and advanced to the left femoral head. Selective left lower extremity angiogram was then performed. This demonstrated normalization of the left common femoral artery just below the external iliac artery stenosis with a normal profunda femoris artery.  The SFA was occluded just beyond its origin at the top of the previously placed stent and there was reconstitution of the mid popliteal artery below the previously placed stent a few centimeters.  Distally, there was  occlusion of the tibioperoneal trunk, posterior tibial, and peroneal arteries with reconstitution distally of both vessels.  The anterior tibial artery appeared to be patent and fairly large without focal stenosis identified. It was felt that it was in the patient's best interest to proceed with intervention after these images to avoid a second procedure and a larger amount of contrast and fluoroscopy based off of the findings from the initial angiogram. The patient was systemically heparinized and a 6 [place] [employer] sheath was then placed over the [employer] wire. I then used a Kumpe catheter and the advantage wire to navigate into the occluded stents without difficulty and cross the occlusion and confirm intraluminal flow in the below-knee popliteal artery.  I then exchanged for a V18 wire and cross the tibioperoneal trunk and proximal posterior tibial artery occlusion confirming intraluminal flow in the distal posterior tibial artery.  I then proceeded with treatment.  The [place] device was brought on the field and 2 passes were made with the [place] device in the left SFA and popliteal arteries to perform mechanical thrombectomy to debulk the thrombus.  This did result in a channel flow, but there remain multiple areas of high-grade residual stenosis and some thrombus formation I elected to primarily stent this area.  Prior to that, I treated the tibial vessels with a 2.5 mm diameter by 30 cm length angioplasty balloon in the posterior tibial artery just above the ankle inflated to 6 atm and then pulled this back to encompass the tibioperoneal trunk and the distal popliteal artery inflating to 8 atm.  A pair of 6 mm diameter by 25 cm length Viabahn stents were then deployed from the mid popliteal artery up to just above the previously placed in the proximal SFA with several centimeters of overlap.  These were postdilated with a 5 mm balloon with excellent angiographic completion result and less than  10% residual stenosis.  There was some spasm in the tibial vessels that was treated with intra-arterial nitroglycerin and was difficult to discern to the degree of stenosis but it appeared to be less than 30%.  The flow was still somewhat sluggish to the inflow lesion I pulled the sheath back and address the left external iliac artery stenosis.  A 7 mm diameter by 4 cm length Lutonix drug-coated angioplasty balloon was inflated to 8 atm for 1 minute.  Completion imaging showed only about a 15 to 20% residual stenosis after angioplasty in the left external iliac artery. I elected to terminate the procedure. The sheath was removed and StarClose closure device was deployed in the right femoral artery with excellent hemostatic result. The patient was taken to the recovery room in stable condition having tolerated the procedure well.  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  The left common iliac artery was fairly normal but at the distal left external iliac artery was a napkin ring like stenosis in the 80% range.              Left lower Extremity:  This demonstrated normalization of the left common femoral artery just below the external iliac artery stenosis with a normal profunda femoris artery.  The SFA was occluded just beyond its origin at the top of the previously placed stent and there was reconstitution of the mid popliteal artery below the previously placed stent a few centimeters.  Distally, there was occlusion of the tibioperoneal trunk, posterior tibial, and peroneal arteries with reconstitution distally of both vessels.  The anterior tibial artery appeared to be patent and fairly large without focal stenosis identified.   Disposition: Patient was taken to the recovery room in stable condition having tolerated the procedure well.  Complications:  None  Theresa Phelps 12/28/2021 9:24 AM   This note was created with Dragon Medical transcription system. Any errors in dictation are purely unintentional.

## 2022-01-25 ENCOUNTER — Other Ambulatory Visit (INDEPENDENT_AMBULATORY_CARE_PROVIDER_SITE_OTHER): Payer: Self-pay | Admitting: Vascular Surgery

## 2022-01-25 DIAGNOSIS — I739 Peripheral vascular disease, unspecified: Secondary | ICD-10-CM

## 2022-01-25 DIAGNOSIS — Z9889 Other specified postprocedural states: Secondary | ICD-10-CM

## 2022-01-28 ENCOUNTER — Encounter (INDEPENDENT_AMBULATORY_CARE_PROVIDER_SITE_OTHER): Payer: Self-pay | Admitting: Nurse Practitioner

## 2022-01-28 ENCOUNTER — Ambulatory Visit (INDEPENDENT_AMBULATORY_CARE_PROVIDER_SITE_OTHER): Payer: Medicaid Other | Admitting: Nurse Practitioner

## 2022-01-28 ENCOUNTER — Ambulatory Visit (INDEPENDENT_AMBULATORY_CARE_PROVIDER_SITE_OTHER): Payer: Medicaid Other

## 2022-01-28 VITALS — BP 120/79 | HR 118 | Resp 18 | Ht 66.0 in | Wt 222.0 lb

## 2022-01-28 DIAGNOSIS — Z9889 Other specified postprocedural states: Secondary | ICD-10-CM | POA: Diagnosis not present

## 2022-01-28 DIAGNOSIS — I739 Peripheral vascular disease, unspecified: Secondary | ICD-10-CM | POA: Diagnosis not present

## 2022-01-28 DIAGNOSIS — E785 Hyperlipidemia, unspecified: Secondary | ICD-10-CM

## 2022-01-28 DIAGNOSIS — M170 Bilateral primary osteoarthritis of knee: Secondary | ICD-10-CM

## 2022-01-29 ENCOUNTER — Encounter (INDEPENDENT_AMBULATORY_CARE_PROVIDER_SITE_OTHER): Payer: Self-pay | Admitting: Nurse Practitioner

## 2022-01-29 NOTE — Progress Notes (Signed)
Subjective:    Patient ID: Theresa Phelps, female    DOB: 1960/04/23, 62 y.o.   MRN: [id number] Chief Complaint  Patient presents with   Follow-up    ultrasound    The patient returns to the office for followup and review status post angiogram with intervention on 12/28/2021.   Procedure:   Procedure(s) Performed:             1.  Ultrasound guidance for vascular access right femoral artery             2.  Catheter placement into left common femoral artery from right femoral approach             3.  Aortogram and selective left lower extremity angiogram             4.  Percutaneous transluminal angioplasty of left posterior tibial and tibioperoneal trunk with 2.5 mm diameter by 30 cm length angioplasty balloon             5.  mechanical thrombectomy of left SFA and popliteal arteries with the Rotorex device             6.  Stent placement x 2 to the left SFA and popliteal arteries with a pair of 6 mm diameter by 25 cm length Viabahn stents             7.  Angioplasty of the left external iliac artery with 7 mm diameter by 4 cm length Lutonix drug-coated angioplasty balloon            8.  StarClose closure device right femoral artery     The patient notes improvement in the lower extremity symptoms. No interval shortening of the patient's claudication distance or rest pain symptoms. No new ulcers or wounds have occurred since the last visit.  There have been no significant changes to the patient's overall health care.  No documented history of amaurosis fugax or recent TIA symptoms. There are no recent neurological changes noted. No documented history of DVT, PE or superficial thrombophlebitis. The patient denies recent episodes of angina or shortness of breath.   ABI's Rt=1.13 and Lt=1.05  (previous ABI's Rt=1.01 and Lt=0.48) Duplex US of the bilateral tibial arteries shows biphasic waveforms with normal toe waveforms bilaterally    Review of Systems  Cardiovascular:   Negative for leg swelling.  Musculoskeletal:  Positive for arthralgias.  All other systems reviewed and are negative.      Objective:   Physical Exam Vitals reviewed.  HENT:     Head: Normocephalic.  Cardiovascular:     Rate and Rhythm: Normal rate.     Pulses:          Dorsalis pedis pulses are detected w/ Doppler on the right side and detected w/ Doppler on the left side.       Posterior tibial pulses are detected w/ Doppler on the right side and detected w/ Doppler on the left side.  Pulmonary:     Effort: Pulmonary effort is normal.  Skin:    General: Skin is warm and dry.  Neurological:     Mental Status: She is alert and oriented to person, place, and time.  Psychiatric:        Mood and Affect: Mood normal.        Behavior: Behavior normal.        Thought Content: Thought content normal.        Judgment: Judgment normal.  BP 120/79 (BP Location: Right Arm)   Pulse (!) 118   Resp 18   Ht '5\' 6"'$  (1.676 m)   Wt 222 lb (100.7 kg)   BMI 35.83 kg/m   Past Medical History:  Diagnosis Date   Acid reflux    Arthritis    rheumatoid arthritis   Asthma    Carpal tunnel syndrome    LEFT   CHF (congestive heart failure) (HCC)    Coronary artery disease    High cholesterol    Myocardial infarction ([place])    Peripheral vascular disease (HCC)    Pre-diabetes    Pulmonary hypertension (HCC)    Severe mitral regurgitation     Social History   Socioeconomic History   Marital status: Single    Spouse name: Not on file   Number of children: Not on file   Years of education: Not on file   Highest education level: Not on file  Occupational History   Not on file  Tobacco Use   Smoking status: Former    Packs/day: 0.50    Years: 20.00    Total pack years: 10.00    Types: Cigarettes    Quit date: 12/2018    Years since quitting: 3.1   Smokeless tobacco: Never  Vaping Use   Vaping Use: Never used  Substance and Sexual Activity   Alcohol use: Yes     Alcohol/week: 3.0 standard drinks of alcohol    Types: 3 Cans of beer per week    Comment: twice week. NONE THIS WEEK   Drug use: No   Sexual activity: Yes    Birth control/protection: Post-menopausal  Other Topics Concern   Not on file  Social History Narrative   Not on file   Social Determinants of Health   Financial Resource Strain: Not on file  Food Insecurity: Not on file  Transportation Needs: Not on file  Physical Activity: Not on file  Stress: Not on file  Social Connections: Not on file  Intimate Partner Violence: Not on file    Past Surgical History:  Procedure Laterality Date   BREAST BIOPSY Left 01/04/2012   CARDIAC VALVE REPLACEMENT     COLONOSCOPY WITH PROPOFOL N/A 04/30/2021   Procedure: COLONOSCOPY WITH PROPOFOL;  Surgeon: Lesly Rubenstein, MD;  Location: [facility];  Service: Endoscopy;  Laterality: N/A;   COLONOSCOPY WITH PROPOFOL N/A 08/13/2021   Procedure: COLONOSCOPY WITH PROPOFOL;  Surgeon: Lesly Rubenstein, MD;  Location: [facility];  Service: Endoscopy;  Laterality: N/A;   CORONARY ARTERY BYPASS GRAFT  12/13/2017   replacement mitral valve by Dr. Evelina Dun   INCISION AND DRAINAGE ABSCESS Right 09/04/2020   Procedure: INCISION AND DRAINAGE ABSCESS-Perianal;  Surgeon: Herbert Pun, MD;  Location: [facility];  Service: General;  Laterality: Right;   LOWER EXTREMITY ANGIOGRAPHY Left 05/28/2015   Procedure: Lower Extremity Angiography;  Surgeon: Algernon Huxley, MD;  Location: [facility];  Service: Cardiovascular;  Laterality: Left;   LOWER EXTREMITY ANGIOGRAPHY Left 12/28/2021   Procedure: Lower Extremity Angiography;  Surgeon: Algernon Huxley, MD;  Location: [facility];  Service: Cardiovascular;  Laterality: Left;   PERIPHERAL VASCULAR CATHETERIZATION Right 07/16/2015   Procedure: Lower Extremity Angiography;  Surgeon: Algernon Huxley, MD;  Location: [facility];  Service: Cardiovascular;  Laterality: Right;   PERIPHERAL  VASCULAR CATHETERIZATION  07/16/2015   Procedure: Lower Extremity Intervention;  Surgeon: Algernon Huxley, MD;  Location: [facility];  Service: Cardiovascular;;  RIGHT/LEFT HEART CATH AND CORONARY ANGIOGRAPHY Bilateral 11/07/2017   Procedure: RIGHT/LEFT HEART CATH AND CORONARY ANGIOGRAPHY;  Surgeon: Corey Skains, MD;  Location: [facility];  Service: Cardiovascular;  Laterality: Bilateral;   TEE WITHOUT CARDIOVERSION N/A 11/07/2017   Procedure: TRANSESOPHAGEAL ECHOCARDIOGRAM (TEE);  Surgeon: Corey Skains, MD;  Location: [facility];  Service: Cardiovascular;  Laterality: N/A;   WISDOM TOOTH EXTRACTION      Family History  Problem Relation Age of Onset   Hypertension Mother    AAA (abdominal aortic aneurysm) Father    Other Father        bowel obstruction    Allergies  Allergen Reactions   Shellfish Allergy Anaphylaxis   Banana     Other reaction(s): Other (See Comments) Burning in the mouth   Garlic     Other reaction(s): Other (See Comments) Pt states arms freeze up and can't talk but no SOB   Isosorbide Nitrate Itching    Blurred vision   Fish-Derived Products Rash       Latest Ref Rng & Units 01/28/2021    6:05 PM 09/07/2020    4:50 AM 09/06/2020    5:32 AM  CBC  WBC 4.0 - 10.5 K/uL 7.8  12.6  19.1   Hemoglobin 12.0 - 15.0 g/dL 15.6  11.4  10.4   Hematocrit 36.0 - 46.0 % 48.9  34.2  31.4   Platelets 150 - 400 K/uL 246  260  211       CMP     Component Value Date/Time   NA 137 01/28/2021 1805   NA 138 01/20/2013 2320   K 4.1 01/28/2021 1805   K 4.1 01/20/2013 2320   CL 106 01/28/2021 1805   CL 108 (H) 01/20/2013 2320   CO2 21 (L) 01/28/2021 1805   CO2 20 (L) 01/20/2013 2320   GLUCOSE 83 01/28/2021 1805   GLUCOSE 165 (H) 01/20/2013 2320   BUN 13 12/28/2021 0737   BUN 17 01/20/2013 2320   CREATININE 0.94 12/28/2021 0737   CREATININE 1.14 01/20/2013 2320   CALCIUM 9.0 01/28/2021 1805   CALCIUM 9.2 01/20/2013 2320   PROT 6.8 09/03/2020  1400   PROT 8.5 (H) 01/20/2013 2320   ALBUMIN 2.9 (L) 09/03/2020 1400   ALBUMIN 3.7 01/20/2013 2320   AST 22 09/03/2020 1400   AST 14 (L) 01/20/2013 2320   ALT 15 09/03/2020 1400   ALT 22 01/20/2013 2320   ALKPHOS 75 09/03/2020 1400   ALKPHOS 105 01/20/2013 2320   BILITOT 1.1 09/03/2020 1400   BILITOT 0.4 01/20/2013 2320   GFRNONAA >60 12/28/2021 0737   GFRNONAA 55 (L) 01/20/2013 2320   GFRAA >60 07/15/2015 1247   GFRAA >60 01/20/2013 2320     VAS [place] ABI WITH/WO TBI  Result Date: 12/21/2021  LOWER EXTREMITY DOPPLER STUDY Patient Name:  Theresa Phelps  Date of Exam:   12/21/2021 Medical Rec #: [id number]         Accession #:    [id number] Date of Birth: July 11, 1960         Patient Gender: F Patient Age:   25 years Exam Location:  [facility] Procedure:      VAS [place] ABI WITH/WO TBI Referring Phys: [name] --------------------------------------------------------------------------------  Indications: Peripheral artery disease. C/O left leg pain  Vascular Interventions: 05/28/15: Left SFA/popliteal PTA/stent;  07/16/15: Right SFA PTA/stent with right popliteal PTA;. Performing Technologist: Almira Coaster RVS  Examination Guidelines: A complete evaluation includes at minimum, Doppler waveform signals and systolic blood pressure reading at the level of bilateral brachial, anterior tibial, and posterior tibial arteries, when vessel segments are accessible. Bilateral testing is considered an integral part of a complete examination. Photoelectric Plethysmograph (PPG) waveforms and toe systolic pressure readings are included as required and additional duplex testing as needed. Limited examinations for reoccurring indications may be performed as noted.  ABI Findings: +---------+------------------+-----+--------+-------+ Right    Rt Pressure (mmHg)IndexWaveformComment +---------+------------------+-----+--------+-------+ Brachial 161                                     +---------+------------------+-----+--------+-------+ PTA      123               0.76 biphasic        +---------+------------------+-----+--------+-------+ DP       163               1.01 biphasic        +---------+------------------+-----+--------+-------+ Great Toe109               0.68 Abnormal        +---------+------------------+-----+--------+-------+ +---------+------------------+-----+-------------------+-------+ Left     Lt Pressure (mmHg)IndexWaveform           Comment +---------+------------------+-----+-------------------+-------+ Brachial 161                                               +---------+------------------+-----+-------------------+-------+ PTA      69                0.43 monophasic                 +---------+------------------+-----+-------------------+-------+ DP       77                0.48 dampened monophasic        +---------+------------------+-----+-------------------+-------+ Great Toe0                 0.00 Absent                     +---------+------------------+-----+-------------------+-------+ +-------+-----------+-----------+------------+------------+ ABI/TBIToday's ABIToday's TBIPrevious ABIPrevious TBI +-------+-----------+-----------+------------+------------+ Right  1.01       0.68       1.01        0.92         +-------+-----------+-----------+------------+------------+ Left   0.48       0.0        1.01        0.94         +-------+-----------+-----------+------------+------------+ Left ABIs and TBIs appear decreased compared to prior study on 02/12/2020. Right ABIs appear essentially unchanged.  Summary: Right: Resting right ankle-brachial index is within normal range. The right toe-brachial index is normal. Left: Resting left ankle-brachial index indicates severe left lower extremity arterial disease. Flow in the left great toe was not adequately detected.  *See table(s) above for measurements and  observations. Electronically signed by [name] MD on 12/21/2021 at 4:24:05 PM.    Final        Assessment & Plan:   1. Peripheral arterial disease with history of revascularization ([place]) Recommend:  The patient is status post successful angiogram  with intervention.  The patient reports that the claudication symptoms and leg pain has improved.   The patient denies lifestyle limiting changes at this point in time.  No further invasive studies, angiography or surgery at this time The patient should continue walking and begin a more formal exercise program.  The patient should continue antiplatelet therapy and aggressive treatment of the lipid abnormalities  Continued surveillance is indicated as atherosclerosis is likely to progress with time.    Patient should undergo noninvasive studies as ordered. The patient will follow up with me to review the studies.  - VAS [place] ABI WITH/WO TBI  2. Hyperlipidemia, unspecified hyperlipidemia type Continue statin as ordered and reviewed, no changes at this time  3. Primary osteoarthritis of both knees Continue NSAID medications as already ordered, these medications have been reviewed and there are no changes at this time.  Continued activity and therapy was stressed.   Current Outpatient Medications on File Prior to Visit  Medication Sig Dispense Refill   acetaminophen (TYLENOL) 500 MG tablet Take 2 tablets (1,000 mg total) by mouth every 6 (six) hours as needed. 30 tablet 0   aspirin EC 81 MG tablet Take 1 tablet (81 mg total) by mouth daily. Swallow whole. 150 tablet 2   atorvastatin (LIPITOR) 80 MG tablet Take 80 mg by mouth daily.     benzonatate (TESSALON) 200 MG capsule Take 1 capsule (200 mg total) by mouth 2 (two) times daily as needed for cough. 30 capsule 0   gabapentin (NEURONTIN) 100 MG capsule Take 100 mg by mouth 2 (two) times daily.     loratadine (CLARITIN) 10 MG tablet Take 10 mg by mouth daily.     nitroGLYCERIN (NITROSTAT)  0.4 MG SL tablet Place 0.4 mg under the tongue every 5 (five) minutes as needed for chest pain.     warfarin (COUMADIN) 6 MG tablet Take 6 mg by mouth at bedtime.     EPINEPHrine 0.3 mg/0.3 mL IJ SOAJ injection Inject into the muscle as directed. (Patient not taking: Reported on 01/28/2022)     ezetimibe (ZETIA) 10 MG tablet Take 10 mg by mouth daily. (Patient not taking: Reported on 12/28/2021)     furosemide (LASIX) 40 MG tablet furosemide 40 mg tablet (Patient not taking: Reported on 12/28/2021)     nitroGLYCERIN (NITROSTAT) 0.4 MG SL tablet Place under the tongue.     [DISCONTINUED] cetirizine (ZYRTEC) 10 MG tablet Take 10 mg by mouth daily. Reported on 05/28/2015     [DISCONTINUED] fluticasone (FLONASE) 50 MCG/ACT nasal spray Place 2 sprays into both nostrils daily. 16 g 0   No current facility-administered medications on file prior to visit.    There are no Patient Instructions on file for this visit. No follow-ups on file.   Kris Hartmann, NP

## 2022-01-31 LAB — VAS US ABI WITH/WO TBI
Left ABI: 1.05
Right ABI: 1.13

## 2022-02-11 ENCOUNTER — Ambulatory Visit: Admission: RE | Admit: 2022-02-11 | Payer: Medicaid Other | Source: Home / Self Care

## 2022-02-11 ENCOUNTER — Encounter: Admission: RE | Payer: Self-pay | Source: Home / Self Care

## 2022-02-11 IMAGING — DX DG CHEST 1V PORT
1 series · 1 of 1 positions shown · non-contrast
Comparison: 08/04/2017

CLINICAL DATA: Questionable sepsis.

EXAM:
PORTABLE CHEST 1 VIEW

[chest ap]
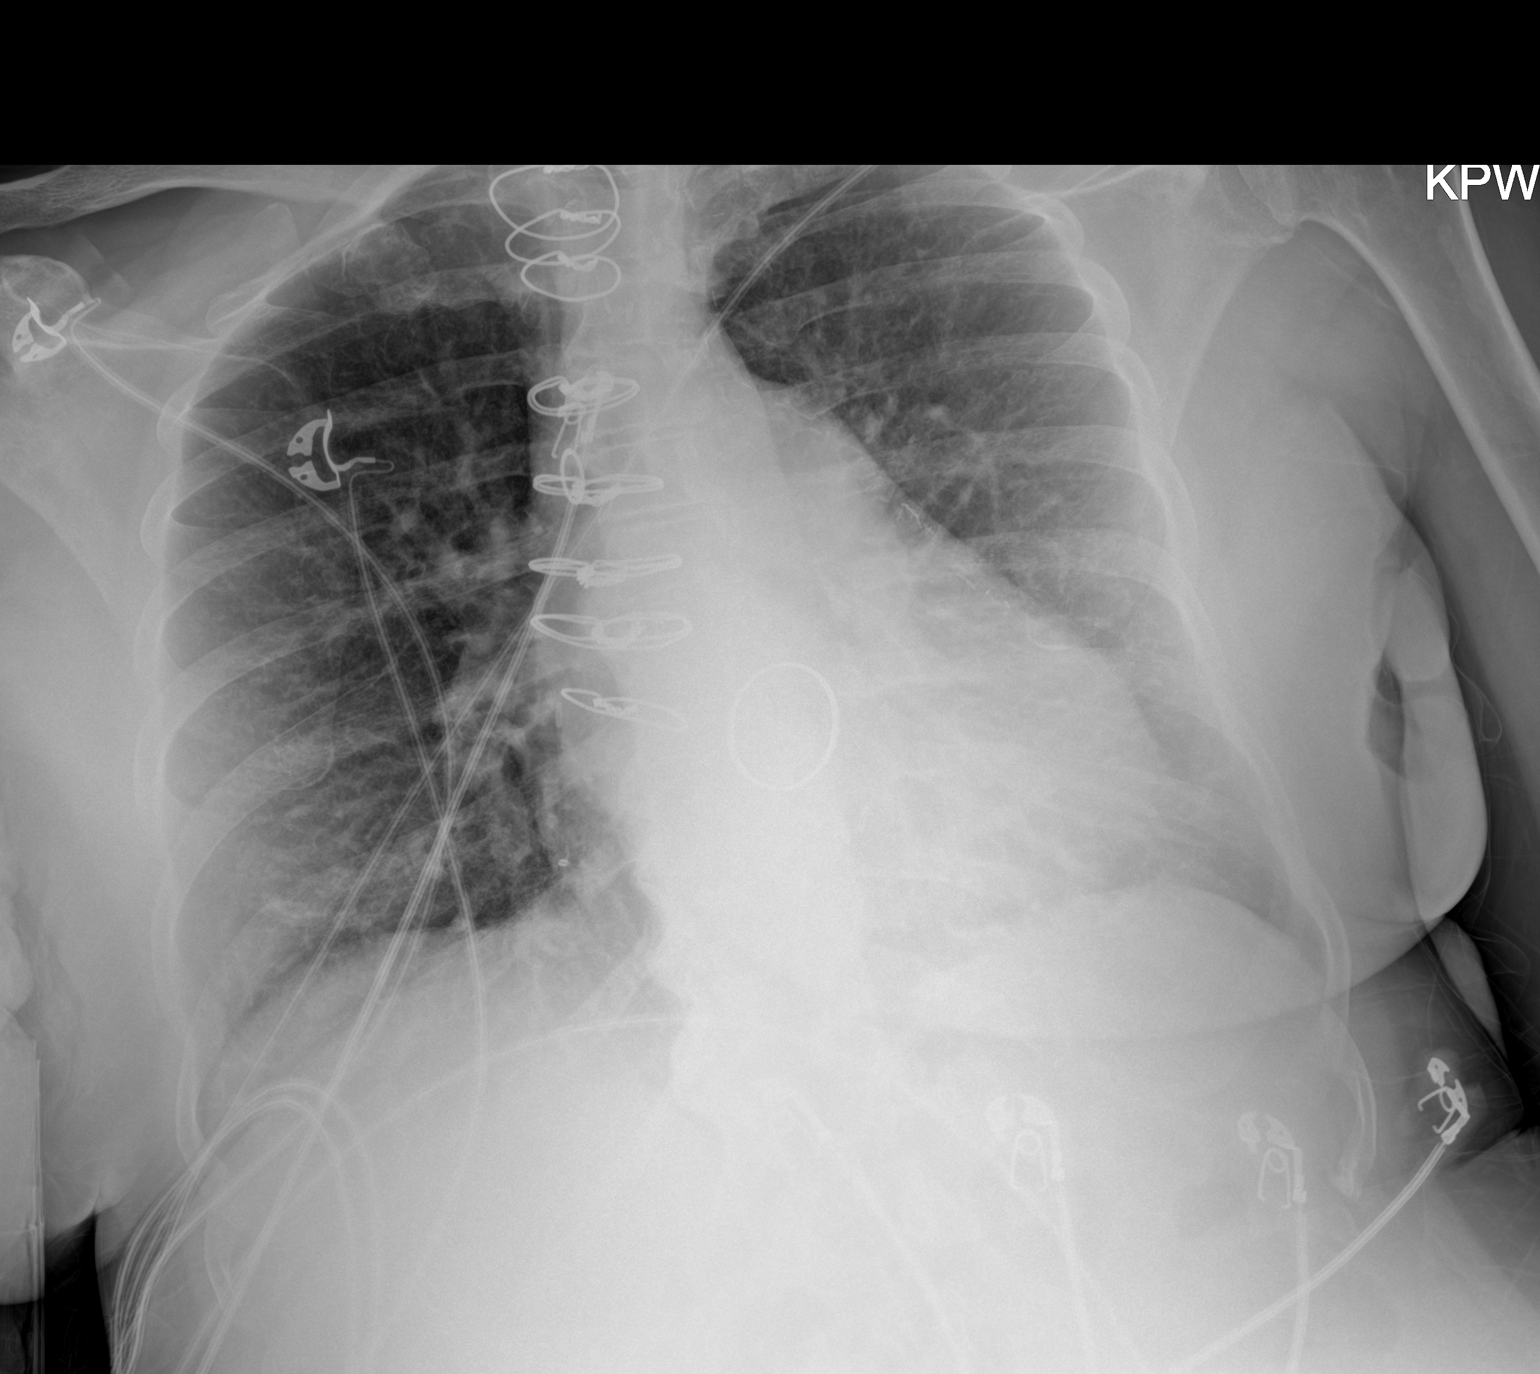

[1 of 1 positions shown; findings below may reference images not displayed]

FINDINGS: Sequelae of interval CABG and mitral valve replacement are
identified. The cardiac silhouette is mildly enlarged. The
interstitial markings are mildly prominent without overt edema or
airspace consolidation identified. No sizable pleural effusion or
pneumothorax is evident. No acute osseous abnormality is seen.
IMPRESSION: No active disease.

## 2022-02-11 SURGERY — COLONOSCOPY WITH PROPOFOL
Anesthesia: General

## 2022-02-12 IMAGING — DX DG CHEST 1V PORT
1 series · 1 of 1 positions shown · non-contrast
Comparison: September 03, 2020

CLINICAL DATA: Fever and AFib.

EXAM:
PORTABLE CHEST 1 VIEW

[chest ap]
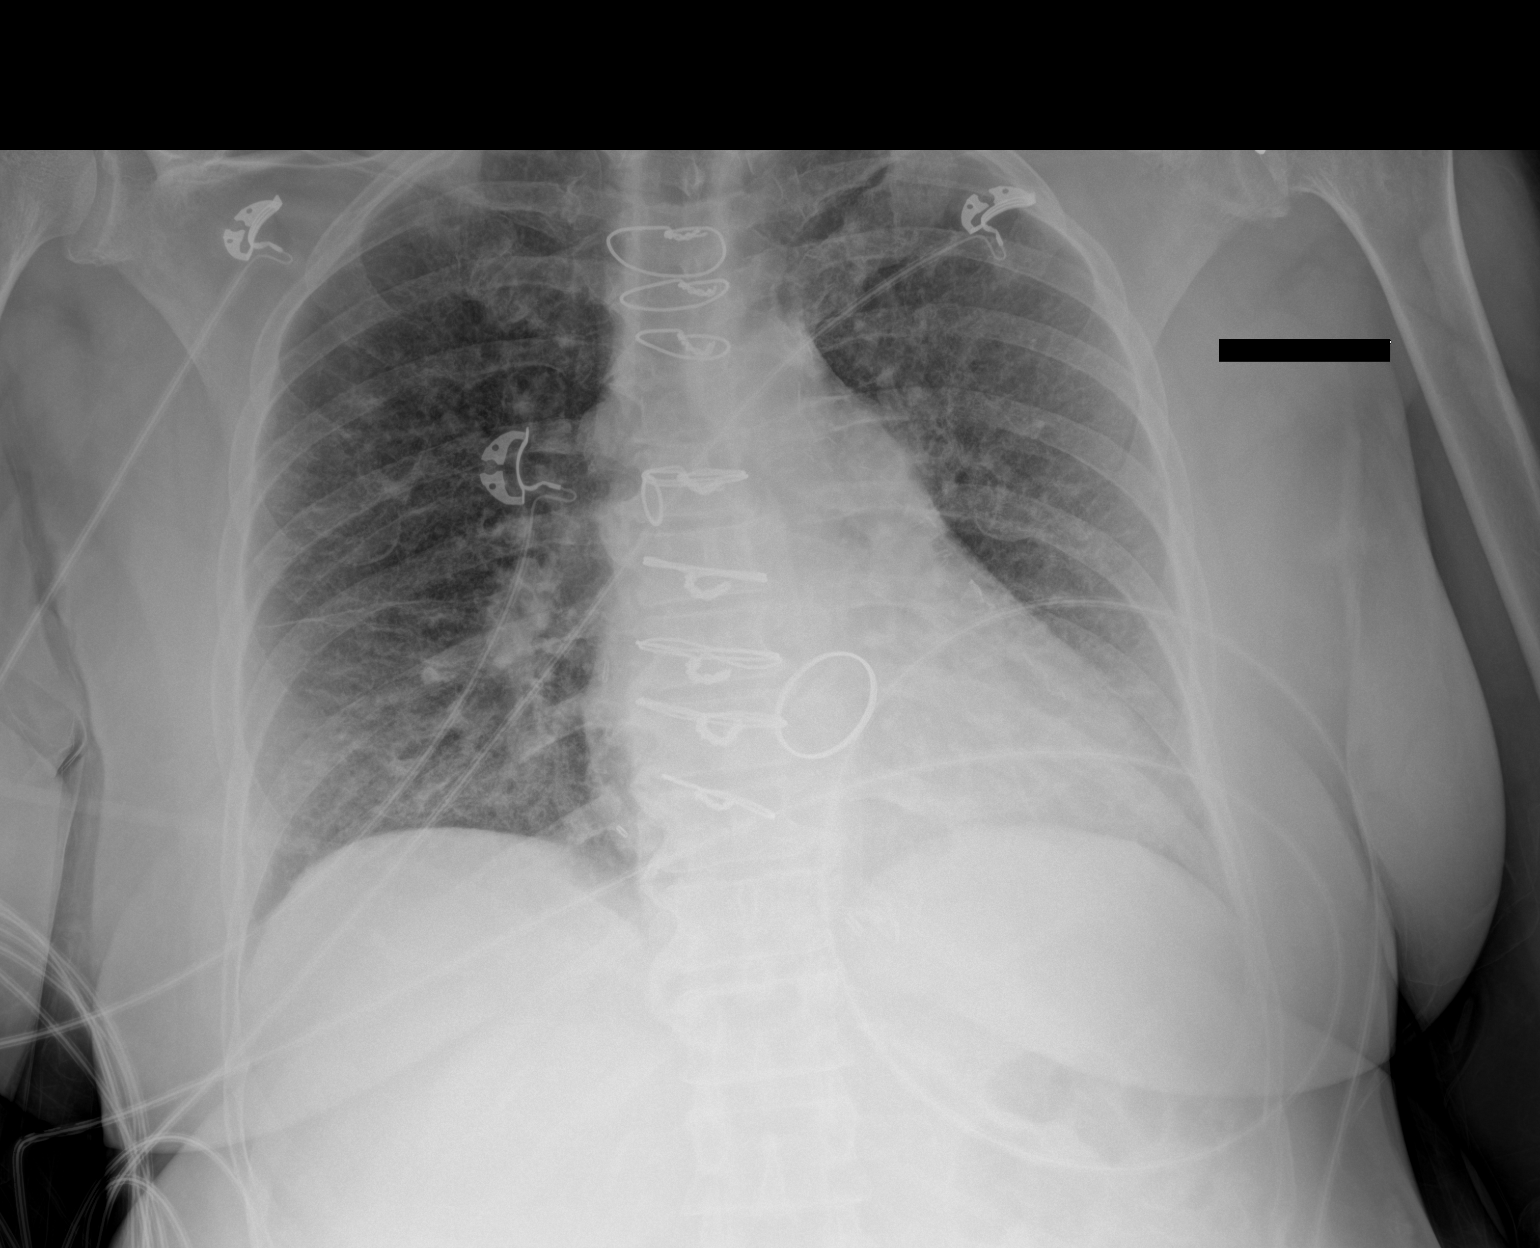

[1 of 1 positions shown; findings below may reference images not displayed]

FINDINGS: Multiple sternal wires are noted. Mildly increased interstitial lung
markings are seen which is stable in appearance when compared to the
prior study. There is no evidence of focal consolidation, pleural
effusion or pneumothorax. The heart size and mediastinal contours
are within normal limits. An artificial cardiac valve is seen. The
visualized skeletal structures are unremarkable.
IMPRESSION: 1. Evidence of prior median sternotomy/CABG.
2. Stable appearance of the chest without active cardiopulmonary
disease.

## 2022-05-02 ENCOUNTER — Other Ambulatory Visit (INDEPENDENT_AMBULATORY_CARE_PROVIDER_SITE_OTHER): Payer: Self-pay | Admitting: Nurse Practitioner

## 2022-05-02 DIAGNOSIS — Z9889 Other specified postprocedural states: Secondary | ICD-10-CM

## 2022-05-06 ENCOUNTER — Encounter (INDEPENDENT_AMBULATORY_CARE_PROVIDER_SITE_OTHER): Payer: Medicaid Other

## 2022-05-06 ENCOUNTER — Ambulatory Visit (INDEPENDENT_AMBULATORY_CARE_PROVIDER_SITE_OTHER): Payer: Medicaid Other | Admitting: Nurse Practitioner

## 2022-06-06 ENCOUNTER — Telehealth (INDEPENDENT_AMBULATORY_CARE_PROVIDER_SITE_OTHER): Payer: Self-pay

## 2022-06-06 NOTE — Telephone Encounter (Signed)
Patient will need to be schedule for ABI and see Dr Wyn Quaker or Vivia Birmingham

## 2022-06-06 NOTE — Telephone Encounter (Signed)
Patient reach out to the office informing that she is having right knee swelling, right foot tingling, and throbbing pain. Patient informed that she is having toe numbness and feels like rocks are under her toes. All symptoms has been going on for 1 week.Patient did no show last appointment for 05/06/22. Please Advise

## 2022-06-07 NOTE — Telephone Encounter (Signed)
Pt is scheduled °

## 2022-06-10 ENCOUNTER — Encounter (INDEPENDENT_AMBULATORY_CARE_PROVIDER_SITE_OTHER): Payer: Self-pay | Admitting: Vascular Surgery

## 2022-06-10 ENCOUNTER — Ambulatory Visit (INDEPENDENT_AMBULATORY_CARE_PROVIDER_SITE_OTHER): Payer: Medicaid Other

## 2022-06-10 ENCOUNTER — Ambulatory Visit (INDEPENDENT_AMBULATORY_CARE_PROVIDER_SITE_OTHER): Payer: Medicaid Other | Admitting: Vascular Surgery

## 2022-06-10 VITALS — BP 143/74 | HR 53 | Resp 14 | Ht 72.0 in | Wt 208.0 lb

## 2022-06-10 DIAGNOSIS — I739 Peripheral vascular disease, unspecified: Secondary | ICD-10-CM

## 2022-06-10 DIAGNOSIS — Z9889 Other specified postprocedural states: Secondary | ICD-10-CM

## 2022-06-10 DIAGNOSIS — R7303 Prediabetes: Secondary | ICD-10-CM | POA: Diagnosis not present

## 2022-06-10 DIAGNOSIS — E785 Hyperlipidemia, unspecified: Secondary | ICD-10-CM

## 2022-06-10 NOTE — Progress Notes (Signed)
MRN : [id number]  Theresa Phelps is a 62 y.o. (05/05/1960) female who presents with chief complaint of  Chief Complaint  Patient presents with   Follow-up    ultrasound  .  History of Present Illness: Patient returns today in follow up of her PAD.  She has been having a lot of pins and needle sensation and tingling in the foot.  She feels like her foot has a piece of tape wrapped around it.  This is predominantly in the left leg.  This is the leg that we worked on about 5 to 6 months ago.  Noninvasive studies were performed today revealing what appears to be some degree of stenosis in the right SFA with no stenosis seen in the left leg by duplex.  The right ABI has dropped some down to 0.73 and the left ABI is stable and in the normal range at 0.94.  Digit pressures are in the normal range bilaterally.  Current Outpatient Medications  Medication Sig Dispense Refill   acetaminophen (TYLENOL) 500 MG tablet Take 2 tablets (1,000 mg total) by mouth every 6 (six) hours as needed. 30 tablet 0   atorvastatin (LIPITOR) 80 MG tablet Take 80 mg by mouth daily.     benzonatate (TESSALON) 200 MG capsule Take 1 capsule (200 mg total) by mouth 2 (two) times daily as needed for cough. 30 capsule 0   gabapentin (NEURONTIN) 100 MG capsule Take 100 mg by mouth 2 (two) times daily.     loratadine (CLARITIN) 10 MG tablet Take 10 mg by mouth daily.     nitroGLYCERIN (NITROSTAT) 0.4 MG SL tablet Place 0.4 mg under the tongue every 5 (five) minutes as needed for chest pain.     warfarin (COUMADIN) 6 MG tablet Take 6 mg by mouth at bedtime.     aspirin EC 81 MG tablet Take 1 tablet (81 mg total) by mouth daily. Swallow whole. (Patient not taking: Reported on 06/10/2022) 150 tablet 2   EPINEPHrine 0.3 mg/0.3 mL IJ SOAJ injection Inject into the muscle as directed. (Patient not taking: Reported on 01/28/2022)     ezetimibe (ZETIA) 10 MG tablet Take 10 mg by mouth daily. (Patient not taking: Reported on 12/28/2021)      furosemide (LASIX) 40 MG tablet furosemide 40 mg tablet (Patient not taking: Reported on 12/28/2021)     nitroGLYCERIN (NITROSTAT) 0.4 MG SL tablet Place under the tongue.     No current facility-administered medications for this visit.    Past Medical History:  Diagnosis Date   Acid reflux    Arthritis    rheumatoid arthritis   Asthma    Carpal tunnel syndrome    LEFT   CHF (congestive heart failure) (HCC)    Coronary artery disease    High cholesterol    Myocardial infarction [facility])    Peripheral vascular disease (HCC)    Pre-diabetes    Pulmonary hypertension (HCC)    Severe mitral regurgitation     Past Surgical History:  Procedure Laterality Date   BREAST BIOPSY Left 01/04/2012   CARDIAC VALVE REPLACEMENT     COLONOSCOPY WITH PROPOFOL N/A 04/30/2021   Procedure: COLONOSCOPY WITH PROPOFOL;  Surgeon: Regis Bill, MD;  Location: [facility] ENDOSCOPY;  Service: Endoscopy;  Laterality: N/A;   COLONOSCOPY WITH PROPOFOL N/A 08/13/2021   Procedure: COLONOSCOPY WITH PROPOFOL;  Surgeon: Regis Bill, MD;  Location: [facility] ENDOSCOPY;  Service: Endoscopy;  Laterality: N/A;   CORONARY ARTERY BYPASS GRAFT  12/13/2017  replacement mitral valve by Dr. Silvestre Mesi   INCISION AND DRAINAGE ABSCESS Right 09/04/2020   Procedure: INCISION AND DRAINAGE ABSCESS-Perianal;  Surgeon: Carolan Shiver, MD;  Location: [facility] ORS;  Service: General;  Laterality: Right;   LOWER EXTREMITY ANGIOGRAPHY Left 05/28/2015   Procedure: Lower Extremity Angiography;  Surgeon: Annice Needy, MD;  Location: [facility] INV[facility] CV [facility];  Service: Cardiovascular;  Laterality: Left;   LOWER EXTREMITY ANGIOGRAPHY Left 12/28/2021   Procedure: Lower Extremity Angiography;  Surgeon: Annice Needy, MD;  Location: [facility] CV LAB;  Service: Cardiovascular;  Laterality: Left;   PERIPHERAL VASCULAR CATHETERIZATION Right 07/16/2015   Procedure: Lower Extremity Angiography;  Surgeon: Annice Needy, MD;  Location: [facility] INV[facility]  CV LAB;  Service: Cardiovascular;  Laterality: Right;   PERIPHERAL VASCULAR CATHETERIZATION  07/16/2015   Procedure: Lower Extremity Intervention;  Surgeon: Annice Needy, MD;  Location: [facility] INV[facility] CV LAB;  Service: Cardiovascular;;   RIGHT/LEFT HEART CATH AND CORONARY ANGIOGRAPHY Bilateral 11/07/2017   Procedure: RIGHT/LEFT HEART CATH AND CORONARY ANGIOGRAPHY;  Surgeon: Lamar Blinks, MD;  Location: [facility] INV[facility] CV LAB;  Service: Cardiovascular;  Laterality: Bilateral;   TEE WITHOUT CARDIOVERSION N/A 11/07/2017   Procedure: TRANSESOPHAGEAL ECHOCARDIOGRAM (TEE);  Surgeon: Lamar Blinks, MD;  Location: [facility] ORS;  Service: Cardiovascular;  Laterality: N/A;   WISDOM TOOTH EXTRACTION       Social History   Tobacco Use   Smoking status: Former    Packs/day: 0.50    Years: 20.00    Additional pack years: 0.00    Total pack years: 10.00    Types: Cigarettes    Quit date: 12/2018    Years since quitting: 3.5   Smokeless tobacco: Never  Vaping Use   Vaping Use: Never used  Substance Use Topics   Alcohol use: Yes    Alcohol/week: 3.0 standard drinks of alcohol    Types: 3 Cans of beer per week    Comment: twice week. NONE THIS WEEK   Drug use: No      Family History  Problem Relation Age of Onset   Hypertension Mother    AAA (abdominal aortic aneurysm) Father    Other Father        bowel obstruction     Allergies  Allergen Reactions   Shellfish Allergy Anaphylaxis   Banana     Other reaction(s): Other (See Comments) Burning in the mouth   Garlic     Other reaction(s): Other (See Comments) Pt states arms freeze up and can't talk but no SOB   Isosorbide Nitrate Itching    Blurred vision   Fish-Derived Products Rash     REVIEW OF SYSTEMS (Negative unless checked)  Constitutional: [] Weight loss  [] Fever  [] Chills Cardiac: [] Chest pain   [] Chest pressure   [] Palpitations   [] Shortness of breath when laying flat   [] Shortness of breath at rest   [] Shortness of  breath with exertion. Vascular:  [x] Pain in legs with walking   [x] Pain in legs at rest   [] Pain in legs when laying flat   [x] Claudication   [] Pain in feet when walking  [] Pain in feet at rest  [] Pain in feet when laying flat   [] History of DVT   [] Phlebitis   [] Swelling in legs   [] Varicose veins   [] Non-healing ulcers Pulmonary:   [] Uses home oxygen   [] Productive cough   [] Hemoptysis   [] Wheeze  [] COPD   [] Asthma Neurologic:  [] Dizziness  [] Blackouts   [] Seizures   [] History of  stroke   [] History of TIA  [] Aphasia   [] Temporary blindness   [] Dysphagia   [] Weakness or numbness in arms   [x] Weakness or numbness in legs Musculoskeletal:  [x] Arthritis   [] Joint swelling   [x] Joint pain   [] Low back pain Hematologic:  [] Easy bruising  [] Easy bleeding   [] Hypercoagulable state   [] Anemic   Gastrointestinal:  [] Blood in stool   [] Vomiting blood  [] Gastroesophageal reflux/heartburn   [] Abdominal pain Genitourinary:  [] Chronic kidney disease   [] Difficult urination  [] Frequent urination  [] Burning with urination   [] Hematuria Skin:  [] Rashes   [] Ulcers   [] Wounds Psychological:  [] History of anxiety   []  History of major depression.  Physical Examination  BP (!) 143/74 (BP Location: Right Arm)   Pulse (!) 53   Resp 14   Ht 6' (1.829 m)   Wt 208 lb (94.3 kg)   BMI 28.21 kg/m  Gen:  WD/WN, NAD Head: [place]/AT, No temporalis wasting. Ear/Nose/Throat: Hearing grossly intact, nares w/o erythema or drainage Eyes: Conjunctiva clear. Sclera non-icteric Neck: Supple.  Trachea midline Pulmonary:  Good air movement, no use of accessory muscles.  Cardiac: RRR, no JVD Vascular:  Vessel Right Left  Radial Palpable Palpable                          PT 1+ Palpable 2+ Palpable  DP 2+ Palpable 2+ Palpable   Gastrointestinal: soft, non-tender/non-distended. No guarding/reflex.  Musculoskeletal: M/S 5/5 throughout.  No deformity or atrophy. Trace LE edema. Neurologic: Sensation grossly intact in  extremities.  Symmetrical.  Speech is fluent.  Psychiatric: Judgment intact, Mood & affect appropriate for pt's clinical situation. Dermatologic: No rashes or ulcers noted.  No cellulitis or open wounds.      Labs No results found for this or any previous visit (from the past 2160 hour(s)).  Radiology No results found.  Assessment/Plan  Hyperlipidemia lipid control important in reducing the progression of atherosclerotic disease. Continue statin therapy   Prediabetes blood glucose control important in reducing the progression of atherosclerotic disease. Also, involved in wound healing.    PAD (peripheral artery disease) (HCC) Noninvasive studies were performed today revealing what appears to be some degree of stenosis in the right SFA with no stenosis seen in the left leg by duplex.  The right ABI has dropped some down to 0.73 and the left ABI is stable and in the normal range at 0.94.  Digit pressures are in the normal range bilaterally.  Her perfusion is currently intact on that left leg and her symptoms are clearly neuropathic at this point.  She has already tried gabapentin and Lyrica in the past.  Continue current medical regimen for PAD.  Recheck in 6 months.    Festus Barren, MD  06/10/2022 10:10 AM    This note was created with Dragon medical transcription system.  Any errors from dictation are purely unintentional

## 2022-06-10 NOTE — Assessment & Plan Note (Signed)
Noninvasive studies were performed today revealing what appears to be some degree of stenosis in the right SFA with no stenosis seen in the left leg by duplex.  The right ABI has dropped some down to 0.73 and the left ABI is stable and in the normal range at 0.94.  Digit pressures are in the normal range bilaterally.  Her perfusion is currently intact on that left leg and her symptoms are clearly neuropathic at this point.  She has already tried gabapentin and Lyrica in the past.  Continue current medical regimen for PAD.  Recheck in 6 months.

## 2022-06-10 NOTE — Assessment & Plan Note (Signed)
lipid control important in reducing the progression of atherosclerotic disease. Continue statin therapy  

## 2022-06-10 NOTE — Assessment & Plan Note (Signed)
blood glucose control important in reducing the progression of atherosclerotic disease. Also, involved in wound healing.   

## 2022-06-16 LAB — VAS US ABI WITH/WO TBI
Left ABI: 0.94
Right ABI: 0.73

## 2022-07-20 ENCOUNTER — Other Ambulatory Visit: Payer: Self-pay

## 2022-07-20 ENCOUNTER — Emergency Department
Admission: EM | Admit: 2022-07-20 | Discharge: 2022-07-20 | Disposition: A | Discharge status: Home / Self Care | Payer: Medicaid Other | Attending: Emergency Medicine | Admitting: Emergency Medicine

## 2022-07-20 ENCOUNTER — Emergency Department: Payer: Medicaid Other

## 2022-07-20 DIAGNOSIS — R42 Dizziness and giddiness: Secondary | ICD-10-CM | POA: Insufficient documentation

## 2022-07-20 DIAGNOSIS — I509 Heart failure, unspecified: Secondary | ICD-10-CM | POA: Insufficient documentation

## 2022-07-20 DIAGNOSIS — R7989 Other specified abnormal findings of blood chemistry: Secondary | ICD-10-CM | POA: Diagnosis not present

## 2022-07-20 DIAGNOSIS — I2510 Atherosclerotic heart disease of native coronary artery without angina pectoris: Secondary | ICD-10-CM | POA: Diagnosis not present

## 2022-07-20 DIAGNOSIS — R791 Abnormal coagulation profile: Secondary | ICD-10-CM | POA: Diagnosis not present

## 2022-07-20 LAB — CBC WITH DIFFERENTIAL/PLATELET
Abs Immature Granulocytes: 0.01 10*3/uL (ref 0.00–0.07)
Basophils Absolute: 0 10*3/uL (ref 0.0–0.1)
Basophils Relative: 1 %
Eosinophils Absolute: 0.3 10*3/uL (ref 0.0–0.5)
Eosinophils Relative: 5 %
HCT: 44.7 % (ref 36.0–46.0)
Hemoglobin: 14.1 g/dL (ref 12.0–15.0)
Immature Granulocytes: 0 %
Lymphocytes Relative: 31 %
Lymphs Abs: 2 10*3/uL (ref 0.7–4.0)
MCH: 28.3 pg (ref 26.0–34.0)
MCHC: 31.5 g/dL (ref 30.0–36.0)
MCV: 89.6 fL (ref 80.0–100.0)
Monocytes Absolute: 0.5 10*3/uL (ref 0.1–1.0)
Monocytes Relative: 8 %
Neutro Abs: 3.6 10*3/uL (ref 1.7–7.7)
Neutrophils Relative %: 55 %
Platelets: 236 10*3/uL (ref 150–400)
RBC: 4.99 MIL/uL (ref 3.87–5.11)
RDW: 13.5 % (ref 11.5–15.5)
WBC: 6.5 10*3/uL (ref 4.0–10.5)
nRBC: 0 % (ref 0.0–0.2)

## 2022-07-20 LAB — PROTIME-INR
INR: 8.1 (ref 0.8–1.2)
Prothrombin Time: 67.5 seconds — ABNORMAL HIGH (ref 11.4–15.2)

## 2022-07-20 LAB — BASIC METABOLIC PANEL
Anion gap: 5 (ref 5–15)
BUN: 13 mg/dL (ref 8–23)
CO2: 22 mmol/L (ref 22–32)
Calcium: 8.4 mg/dL — ABNORMAL LOW (ref 8.9–10.3)
Chloride: 110 mmol/L (ref 98–111)
Creatinine, Ser: 0.87 mg/dL (ref 0.44–1.00)
GFR, Estimated: >60 mL/min (ref >60)
Glucose, Bld: 126 mg/dL — ABNORMAL HIGH (ref 70–99)
Potassium: 4 mmol/L (ref 3.5–5.1)
Sodium: 137 mmol/L (ref 135–145)

## 2022-07-20 NOTE — ED Triage Notes (Signed)
Patient sent by her PCP for INR > 10 (takes Coumadin); Patient has no symptoms or complaints, INR was discovered to be high during routine lab work

## 2022-07-20 NOTE — ED Provider Notes (Signed)
[facility] Provider Note    Event Date/Time   First MD Initiated Contact with Patient 07/20/22 1537     (approximate)   History   Abnormal Lab (Patient sent by her PCP for INR > 10 (takes Coumadin); Patient has no symptoms or complaints, INR was discovered to be high during routine lab work)   HPI  Theresa Phelps is a 62 y.o. female   Past medical history of CHF, CAD, MI, peripheral vascular disease, pulmonary hypertension, cardiac valve replacement on Coumadin who presents emergency department with increased INR above 10 on testing 7/15 and 7/17, asymptomatic, coming to the emergency department at the request of her Coumadin clinic.  She has been taking her medications as advised by her doctor, no new medications, has had episodes in the past with increased INR needing close management.  On telephone encounter I noted that she had complained of feeling lightheaded and she states that this has completely resolved, denies headache, denies trauma, denies any other acute medical complaints and feels completely normal. No bleeding  INR today in our emergency department is in the eights.  Independent Historian contributed to assessment above: Her daughter is at bedside to corroborate information given above  External Medical Documents Reviewed: Telephone encounters and outside labs from 7/15 and 7/17 documenting increased INR above 10      Physical Exam   Triage Vital Signs: ED Triage Vitals  Encounter Vitals Group     BP 07/20/22 1427 (!) 146/67     Systolic BP Percentile --      Diastolic BP Percentile --      Pulse Rate 07/20/22 1427 (!) 56     Resp 07/20/22 1427 20     Temp 07/20/22 1427 98.4 F (36.9 C)     Temp Source 07/20/22 1427 Oral     SpO2 07/20/22 1427 98 %     Weight 07/20/22 1426 230 lb (104.3 kg)     Height 07/20/22 1426 6' (1.829 m)     Head Circumference --      Peak Flow --      Pain Score 07/20/22 1427 0     Pain Loc --       Pain Education --      Exclude from Growth Chart --     Most recent vital signs: Vitals:   07/20/22 1646 07/20/22 1646  BP: (!) 145/71   Pulse: (!) 50 (!) 50  Resp: 18 18  Temp:    SpO2: 96% 97%    General: Awake, no distress.  CV:  Good peripheral perfusion. Resp:  Normal effort.  Abd:  No distention.  Other:  Awake alert pleasant comfortable with normal hemodynamics, moving all extremities, no signs of trauma.   ED Results / Procedures / Treatments   Labs (all labs ordered are listed, but only abnormal results are displayed) Labs Reviewed  BASIC METABOLIC PANEL - Abnormal; Notable for the following components:      Result Value   Glucose, Bld 126 (*)    Calcium 8.4 (*)    All other components within normal limits  PROTIME-INR - Abnormal; Notable for the following components:   Prothrombin Time 67.5 (*)    INR 8.1 (*)    All other components within normal limits  CBC WITH DIFFERENTIAL/PLATELET     I ordered and reviewed the above labs they are notable for her INR is 8.1.  Her H&H is at baseline.    RADIOLOGY I independently  reviewed and interpreted CT scan of the head and see no obvious bleeding or midline shift   PROCEDURES:  Critical Care performed: No  Procedures   MEDICATIONS ORDERED IN ED: Medications - No data to display   IMPRESSION / MDM / ASSESSMENT AND PLAN / ED COURSE  I reviewed the triage vital signs and the nursing notes.                                Patient's presentation is most consistent with acute presentation with potential threat to life or bodily function.  Differential diagnosis includes, but is not limited to, ICH, elevated INR   The patient is on the cardiac monitor to evaluate for evidence of arrhythmia and/or significant heart rate changes.  MDM: Patient with elevated INR twice in the last 2 days above 10 currently at 8.1, has been not taking her Coumadin for the last few days as advised by her clinic.  No  reversal has been given.  She denies any symptoms currently but did feel lightheaded a couple of days ago.  No trauma.  CT head to rule out spontaneous ICH.  Otherwise looks well.  Since she is an 8.1 now, no indication for reversal at this time.  Asked to continue to hold her anticoagulation and follow-up with anticoagulation clinic.         FINAL CLINICAL IMPRESSION(S) / ED DIAGNOSES   Final diagnoses:  Elevated INR     Rx / DC Orders   ED Discharge Orders     None        Note:  This document was prepared using Dragon voice recognition software and may include unintentional dictation errors.    Pilar Jarvis, MD 07/20/22 2325

## 2022-07-20 NOTE — ED Notes (Signed)
Critical Result: INR 8.2  Ray, MD made aware.

## 2022-07-20 NOTE — Discharge Instructions (Signed)
Call your cardiology/Coumadin clinic regarding your latest testing today with an INR of 8.1  Do not take any further warfarin until you are advised by your clinic on next steps.

## 2022-07-20 NOTE — ED Notes (Signed)
Pt verbalizes understanding of discharge instructions. Opportunity for questioning and answers were provided. Pt discharged from ED to home with daughter.    

## 2022-09-09 ENCOUNTER — Ambulatory Visit: Admit: 2022-09-09 | Payer: Medicaid Other

## 2022-09-09 SURGERY — COLONOSCOPY WITH PROPOFOL
Anesthesia: General

## 2022-10-31 ENCOUNTER — Telehealth (INDEPENDENT_AMBULATORY_CARE_PROVIDER_SITE_OTHER): Payer: Self-pay

## 2022-10-31 NOTE — Telephone Encounter (Signed)
It looks like when she saw Dr. Wyn Quaker it was noted that some of her pain is neuropathic (relating to the nerves) which is usually treated by her primary.  However we can see if we can move up her appointment to evaluate her arterial perfusion to see if there has been any significant change

## 2022-10-31 NOTE — Telephone Encounter (Signed)
Patient left a message stating that she is experiencing tightness with left foot, burning sensation with toes, toes on left foot are balling up and not able to straighten. Patient states these symptoms has being going on since the left le angio on December 2023. Patient was last seen in the office June 2024. Please Advise.

## 2022-10-31 NOTE — Telephone Encounter (Signed)
Patient was notified with medical recommendations and verbalized understanding. Patient was informed that the front receptionist will contact to for sooner appointment.

## 2022-11-01 NOTE — Telephone Encounter (Signed)
Please move up her 11/29 appointment and all associated studies as you are able with availability

## 2022-11-16 ENCOUNTER — Ambulatory Visit (INDEPENDENT_AMBULATORY_CARE_PROVIDER_SITE_OTHER): Payer: Medicaid Other

## 2022-11-16 ENCOUNTER — Encounter (INDEPENDENT_AMBULATORY_CARE_PROVIDER_SITE_OTHER): Payer: Self-pay | Admitting: Nurse Practitioner

## 2022-11-16 ENCOUNTER — Ambulatory Visit (INDEPENDENT_AMBULATORY_CARE_PROVIDER_SITE_OTHER): Payer: Medicaid Other | Admitting: Nurse Practitioner

## 2022-11-16 VITALS — BP 114/74 | HR 52 | Resp 18 | Ht 70.0 in | Wt 221.0 lb

## 2022-11-16 DIAGNOSIS — I739 Peripheral vascular disease, unspecified: Secondary | ICD-10-CM

## 2022-11-16 DIAGNOSIS — I70222 Atherosclerosis of native arteries of extremities with rest pain, left leg: Secondary | ICD-10-CM | POA: Diagnosis not present

## 2022-11-16 DIAGNOSIS — E785 Hyperlipidemia, unspecified: Secondary | ICD-10-CM | POA: Diagnosis not present

## 2022-11-17 ENCOUNTER — Telehealth (INDEPENDENT_AMBULATORY_CARE_PROVIDER_SITE_OTHER): Payer: Self-pay

## 2022-11-17 LAB — VAS US ABI WITH/WO TBI
Left ABI: 0.87
Right ABI: 0.84

## 2022-11-17 NOTE — Telephone Encounter (Signed)
Spoke with the patient and she is scheduled with Dr. Wyn Quaker on 11/21/22 for a LLE angio at the [facility]. Pre-procedure instructions were discussed and will be sent to Mychart and mailed.

## 2022-11-20 NOTE — H&P (View-Only) (Signed)
Subjective:    Patient ID: Theresa Phelps, female    DOB: [birth year], 62 y.o.   MRN: [id number] Chief Complaint  Patient presents with   Follow-up    6 month ABI +    Patient returns today in follow up of her PAD.  She has been having a lot of pins and needle sensation and tingling in the foot.  She feels like her foot has a piece of tape wrapped around it.  This is predominantly in the left leg.  This is the leg that we worked on about 5 to 6 months ago.  At the time of intervention her leg was profoundly ischemic.  Today the patient has an ABI 0.84 on the right and 0.87 on the left.  The previous ABI on the right was 0.73 and left was 0.94.  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  She has monophasic/biphasic waveforms in the right with monophasic in the left.    Review of Systems  Neurological:  Positive for numbness.  All other systems reviewed and are negative.      Objective:   Physical Exam Vitals reviewed.  HENT:     Head: Normocephalic.  Cardiovascular:     Rate and Rhythm: Normal rate.     Pulses:          Dorsalis pedis pulses are detected w/ Doppler on the right side and detected w/ Doppler on the left side.       Posterior tibial pulses are detected w/ Doppler on the right side and detected w/ Doppler on the left side.  Pulmonary:     Effort: Pulmonary effort is normal.  Skin:    General: Skin is warm and dry.  Neurological:     Mental Status: She is alert and oriented to person, place, and time.  Psychiatric:        Mood and Affect: Mood normal.        Behavior: Behavior normal.        Thought Content: Thought content normal.        Judgment: Judgment normal.     BP 114/74 (BP Location: Right Arm)   Pulse (!) 52   Resp 18   Ht 5\' 10"  (1.778 m)   Wt 221 lb (100.2 kg)   BMI 31.71 kg/m   Past Medical History:  Diagnosis Date   Acid reflux    Arthritis    rheumatoid arthritis   Asthma    Carpal tunnel syndrome    LEFT   CHF (congestive  heart failure) (HCC)    Coronary artery disease    High cholesterol    Myocardial infarction (HCC)    Peripheral vascular disease (HCC)    Pre-diabetes    Pulmonary hypertension (HCC)    Severe mitral regurgitation     Social History   Socioeconomic History   Marital status: Single    Spouse name: Not on file   Number of children: Not on file   Years of education: Not on file   Highest education level: Not on file  Occupational History   Not on file  Tobacco Use   Smoking status: Former    Current packs/day: 0.00    Average packs/day: 0.5 packs/day for 20.0 years (10.0 ttl pk-yrs)    Types: Cigarettes    Start date: 12/1998    Quit date: 12/2018    Years since quitting: 3.9   Smokeless tobacco: Never  Vaping Use   Vaping status: Never  Used  Substance and Sexual Activity   Alcohol use: Yes    Alcohol/week: 3.0 standard drinks of alcohol    Types: 3 Cans of beer per week    Comment: twice week. NONE THIS WEEK   Drug use: No   Sexual activity: Yes    Birth control/protection: Post-menopausal  Other Topics Concern   Not on file  Social History Narrative   Not on file   Social Determinants of Health   Financial Resource Strain: High Risk (08/08/2022)   Received from [facility]   Overall Financial Resource Strain (CARDIA)    Difficulty of Paying Living Expenses: Hard  Food Insecurity: Food Insecurity Present (08/08/2022)   Received from [facility]   Hunger Vital Sign    Worried About Running Out of Food in the Last Year: Sometimes true    Ran Out of Food in the Last Year: Sometimes true  Transportation Needs: No Transportation Needs (08/08/2022)   Received from [facility] - Transportation    In the past 12 months, has lack of transportation kept you from medical appointments or from getting medications?: No    Lack of Transportation (Non-Medical): No  Physical Activity: Insufficiently Active (08/08/2022)    Received from [facility]   Exercise Vital Sign    Days of Exercise per Week: 5 days    Minutes of Exercise per Session: 20 min  Stress: Stress Concern Present (08/08/2022)   Received from [facility] - Occupational Stress Questionnaire    Feeling of Stress : To some extent  Social Connections: Socially Isolated (08/08/2022)   Received from [facility]   Social Connection and Isolation Panel [NHANES]    Frequency of Communication with Friends and Family: More than three times a week    Frequency of Social Gatherings with Friends and Family: More than three times a week    Attends Religious Services: Never    Database administrator or Organizations: No    Attends Banker Meetings: Never    Marital Status: Never married  Intimate Partner Violence: Not on file    Past Surgical History:  Procedure Laterality Date   BREAST BIOPSY Left 01/04/2012   CARDIAC VALVE REPLACEMENT     COLONOSCOPY WITH PROPOFOL N/A 04/30/2021   Procedure: COLONOSCOPY WITH PROPOFOL;  Surgeon: Regis Bill, MD;  Location: [facility];  Service: Endoscopy;  Laterality: N/A;   COLONOSCOPY WITH PROPOFOL N/A 08/13/2021   Procedure: COLONOSCOPY WITH PROPOFOL;  Surgeon: Regis Bill, MD;  Location: [facility];  Service: Endoscopy;  Laterality: N/A;   CORONARY ARTERY BYPASS GRAFT  12/13/2017   replacement mitral valve by Dr. Silvestre Mesi   INCISION AND DRAINAGE ABSCESS Right 09/04/2020   Procedure: INCISION AND DRAINAGE ABSCESS-Perianal;  Surgeon: Carolan Shiver, MD;  Location: [facility] OR[facility];  Service: General;  Laterality: Right;   LOWER EXTREMITY ANGIOGRAPHY Left 05/28/2015   Procedure: Lower Extremity Angiography;  Surgeon: Annice Needy, MD;  Location: [facility];  Service: Cardiovascular;  Laterality: Left;   LOWER EXTREMITY ANGIOGRAPHY Left 12/28/2021   Procedure: Lower Extremity  Angiography;  Surgeon: Annice Needy, MD;  Location: [facility];  Service: Cardiovascular;  Laterality: Left;   PERIPHERAL VASCULAR CATHETERIZATION Right 07/16/2015   Procedure: Lower Extremity Angiography;  Surgeon: Annice Needy, MD;  Location: [facility];  Service: Cardiovascular;  Laterality: Right;  PERIPHERAL VASCULAR CATHETERIZATION  07/16/2015   Procedure: Lower Extremity Intervention;  Surgeon: Annice Needy, MD;  Location: [facility];  Service: Cardiovascular;;   RIGHT/LEFT HEART CATH AND CORONARY ANGIOGRAPHY Bilateral 11/07/2017   Procedure: RIGHT/LEFT HEART CATH AND CORONARY ANGIOGRAPHY;  Surgeon: Lamar Blinks, MD;  Location: [facility];  Service: Cardiovascular;  Laterality: Bilateral;   TEE WITHOUT CARDIOVERSION N/A 11/07/2017   Procedure: TRANSESOPHAGEAL ECHOCARDIOGRAM (TEE);  Surgeon: Lamar Blinks, MD;  Location: [facility];  Service: Cardiovascular;  Laterality: N/A;   WISDOM TOOTH EXTRACTION      Family History  Problem Relation Age of Onset   Hypertension Mother    AAA (abdominal aortic aneurysm) Father    Other Father        bowel obstruction    Allergies  Allergen Reactions   Shellfish Allergy Anaphylaxis   Banana     Other reaction(s): Other (See Comments) Burning in the mouth   Garlic     Other reaction(s): Other (See Comments) Pt states arms freeze up and can't talk but no SOB   Isosorbide Nitrate Itching    Blurred vision   Fish-Derived Products Rash       Latest Ref Rng & Units 07/20/2022    2:36 PM 01/28/2021    6:05 PM 09/07/2020    4:50 AM  CBC  WBC 4.0 - 10.5 K/uL 6.5  7.8  12.6   Hemoglobin 12.0 - 15.0 g/dL 65.7  84.6  96.2   Hematocrit 36.0 - 46.0 % 44.7  48.9  34.2   Platelets 150 - 400 K/uL 236  246  260       CMP     Component Value Date/Time   NA 137 07/20/2022 1436   NA 138 01/20/2013 2320   K 4.0 07/20/2022 1436   K 4.1 01/20/2013 2320   CL 110 07/20/2022 1436   CL 108 (H) 01/20/2013  2320   CO2 22 07/20/2022 1436   CO2 20 (L) 01/20/2013 2320   GLUCOSE 126 (H) 07/20/2022 1436   GLUCOSE 165 (H) 01/20/2013 2320   BUN 13 07/20/2022 1436   BUN 17 01/20/2013 2320   CREATININE 0.87 07/20/2022 1436   CREATININE 1.14 01/20/2013 2320   CALCIUM 8.4 (L) 07/20/2022 1436   CALCIUM 9.2 01/20/2013 2320   PROT 6.8 09/03/2020 1400   PROT 8.5 (H) 01/20/2013 2320   ALBUMIN 2.9 (L) 09/03/2020 1400   ALBUMIN 3.7 01/20/2013 2320   AST 22 09/03/2020 1400   AST 14 (L) 01/20/2013 2320   ALT 15 09/03/2020 1400   ALT 22 01/20/2013 2320   ALKPHOS 75 09/03/2020 1400   ALKPHOS 105 01/20/2013 2320   BILITOT 1.1 09/03/2020 1400   BILITOT 0.4 01/20/2013 2320   GFRNONAA >60 07/20/2022 1436   GFRNONAA 55 (L) 01/20/2013 2320     VAS [place] ABI WITH/WO TBI  Result Date: 11/17/2022  LOWER EXTREMITY DOPPLER STUDY Patient Name:  Theresa Phelps  Date of Exam:   11/16/2022 Medical Rec #: [id number]         Accession #:    [id number] Date of Birth: Dec 30, 1960         Patient Gender: F Patient Age:   55 years Exam Location:  [facility] Procedure:      VAS [place] ABI WITH/WO TBI Referring Phys: Barbara Cower DEW --------------------------------------------------------------------------------  Indications: Claudication, and peripheral artery disease. Constant left foot  pain since stent placement High Risk Factors: Hyperlipidemia, past history of smoking, prior MI, coronary                    artery disease.  Vascular Interventions: 05/28/15: Left SFA/popliteal PTA/stent;                         07/16/15: Right SFA PTA/stent with right popliteal PTA;                         12/28/21: Left SFA/popliteal thrombectomy/stent x2;. Comparison Study: Prior duplex 06/10/2022 showed elevated velocities in the                   proximal right SFA. Performing Technologist: Hardie Lora RVT  Examination Guidelines: A complete evaluation includes at minimum, Doppler waveform signals and systolic blood pressure  reading at the level of bilateral brachial, anterior tibial, and posterior tibial arteries, when vessel segments are accessible. Bilateral testing is considered an integral part of a complete examination. Photoelectric Plethysmograph (PPG) waveforms and toe systolic pressure readings are included as required and additional duplex testing as needed. Limited examinations for reoccurring indications may be performed as noted.  ABI Findings: +---------+------------------+-----+----------+--------+ Right    Rt Pressure (mmHg)IndexWaveform  Comment  +---------+------------------+-----+----------+--------+ Brachial 135                                       +---------+------------------+-----+----------+--------+ PTA      117               0.84 monophasic         +---------+------------------+-----+----------+--------+ DP       109               0.78 biphasic           +---------+------------------+-----+----------+--------+ Great Toe93                0.67                    +---------+------------------+-----+----------+--------+ +---------+------------------+-----+----------+-------+ Left     Lt Pressure (mmHg)IndexWaveform  Comment +---------+------------------+-----+----------+-------+ Brachial 139                                      +---------+------------------+-----+----------+-------+ PTA      116               0.83 monophasic        +---------+------------------+-----+----------+-------+ DP       121               0.87 monophasic        +---------+------------------+-----+----------+-------+ Great Toe82                0.59                   +---------+------------------+-----+----------+-------+ +-------+-----------+-----------+------------+------------+ ABI/TBIToday's ABIToday's TBIPrevious ABIPrevious TBI +-------+-----------+-----------+------------+------------+ Right  0.84       0.67       0.73        0.90          +-------+-----------+-----------+------------+------------+ Left   0.87       0.59       0.94        0.75         +-------+-----------+-----------+------------+------------+  Right ABIs appear increased compared to prior study on 06/10/2022. Left ABIs appear essentially unchanged compared to prior study on 06/10/2022.  Summary: Right: Resting right ankle-brachial index indicates mild right lower extremity arterial disease. The right toe-brachial index is abnormal. Left: Resting left ankle-brachial index indicates mild left lower extremity arterial disease. The left toe-brachial index is abnormal. *See table(s) above for measurements and observations.  Electronically signed by Levora Dredge MD on 11/17/2022 at 3:34:57 PM.    Final        Assessment & Plan:   1. Atherosclerosis of native artery of left lower extremity with rest pain (HCC) Recommend:  The patient continues to have significant pain in her left lower extremity.  Her ABIs have reduced since her last follow-up visit.  She does have some stenosis within the left lower extremity from previous studies.  I suspect that the pain she is actually describing is neuropathic but in order to fully rule out any arterial involvement the best course of action would be for the patient undergo angiogram.  I discussed with patient that if her pain continues post angiogram it is likely related to neuropathic pain that she suffered from having an ischemic leg prior to intervention months ago.  Given the severity of the patient's severe left lower extremity symptoms the patient should undergo angiography with the hope for intervention.  Risk and benefits were reviewed the patient.  Indications for the procedure were reviewed.  All questions were answered, the patient agrees to proceed with left lower extremity angiography and possible intervention.   The patient should continue walking and begin a more formal exercise program.  The patient should  continue antiplatelet therapy and aggressive treatment of the lipid abnormalities  The patient will follow up with me after the angiogram.   2. Hyperlipidemia, unspecified hyperlipidemia type Continue statin as ordered and reviewed, no changes at this time   Current Outpatient Medications on File Prior to Visit  Medication Sig Dispense Refill   acetaminophen (TYLENOL) 500 MG tablet Take 2 tablets (1,000 mg total) by mouth every 6 (six) hours as needed. 30 tablet 0   atorvastatin (LIPITOR) 80 MG tablet Take 80 mg by mouth daily.     benzonatate (TESSALON) 200 MG capsule Take 1 capsule (200 mg total) by mouth 2 (two) times daily as needed for cough. 30 capsule 0   fluticasone (FLONASE) 50 MCG/ACT nasal spray Place 2 sprays into both nostrils daily.     folic acid (FOLVITE) 1 MG tablet Take by mouth.     furosemide (LASIX) 40 MG tablet      gabapentin (NEURONTIN) 100 MG capsule Take 100 mg by mouth 2 (two) times daily.     loratadine (CLARITIN) 10 MG tablet Take 10 mg by mouth daily.     nitroGLYCERIN (NITROSTAT) 0.4 MG SL tablet Place under the tongue.     nitroGLYCERIN (NITROSTAT) 0.4 MG SL tablet Place 0.4 mg under the tongue every 5 (five) minutes as needed for chest pain.     warfarin (COUMADIN) 6 MG tablet Take 6 mg by mouth at bedtime.     amLODipine (NORVASC) 5 MG tablet Take 5 mg by mouth daily.     aspirin EC 81 MG tablet Take 1 tablet (81 mg total) by mouth daily. Swallow whole. (Patient not taking: Reported on 06/10/2022) 150 tablet 2   clopidogrel (PLAVIX) 75 MG tablet Take 75 mg by mouth once.     EPINEPHrine 0.3 mg/0.3 mL IJ SOAJ injection Inject into  the muscle as directed. (Patient not taking: Reported on 01/28/2022)     ezetimibe (ZETIA) 10 MG tablet Take 10 mg by mouth daily. (Patient not taking: Reported on 12/28/2021)     [DISCONTINUED] cetirizine (ZYRTEC) 10 MG tablet Take 10 mg by mouth daily. Reported on 05/28/2015     No current facility-administered medications on file  prior to visit.    There are no Patient Instructions on file for this visit. No follow-ups on file.   Georgiana Spinner, NP

## 2022-11-20 NOTE — Progress Notes (Signed)
Subjective:    Patient ID: Theresa Phelps, female    DOB: [birth year], 62 y.o.   MRN: [id number] Chief Complaint  Patient presents with   Follow-up    6 month ABI +    Patient returns today in follow up of her PAD.  She has been having a lot of pins and needle sensation and tingling in the foot.  She feels like her foot has a piece of tape wrapped around it.  This is predominantly in the left leg.  This is the leg that we worked on about 5 to 6 months ago.  At the time of intervention her leg was profoundly ischemic.  Today the patient has an ABI 0.84 on the right and 0.87 on the left.  The previous ABI on the right was 0.73 and left was 0.94.  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  She has monophasic/biphasic waveforms in the right with monophasic in the left.    Review of Systems  Neurological:  Positive for numbness.  All other systems reviewed and are negative.      Objective:   Physical Exam Vitals reviewed.  HENT:     Head: Normocephalic.  Cardiovascular:     Rate and Rhythm: Normal rate.     Pulses:          Dorsalis pedis pulses are detected w/ Doppler on the right side and detected w/ Doppler on the left side.       Posterior tibial pulses are detected w/ Doppler on the right side and detected w/ Doppler on the left side.  Pulmonary:     Effort: Pulmonary effort is normal.  Skin:    General: Skin is warm and dry.  Neurological:     Mental Status: She is alert and oriented to person, place, and time.  Psychiatric:        Mood and Affect: Mood normal.        Behavior: Behavior normal.        Thought Content: Thought content normal.        Judgment: Judgment normal.     BP 114/74 (BP Location: Right Arm)   Pulse (!) 52   Resp 18   Ht 5\' 10"  (1.778 m)   Wt 221 lb (100.2 kg)   BMI 31.71 kg/m   Past Medical History:  Diagnosis Date   Acid reflux    Arthritis    rheumatoid arthritis   Asthma    Carpal tunnel syndrome    LEFT   CHF (congestive  heart failure) (HCC)    Coronary artery disease    High cholesterol    Myocardial infarction (HCC)    Peripheral vascular disease (HCC)    Pre-diabetes    Pulmonary hypertension (HCC)    Severe mitral regurgitation     Social History   Socioeconomic History   Marital status: Single    Spouse name: Not on file   Number of children: Not on file   Years of education: Not on file   Highest education level: Not on file  Occupational History   Not on file  Tobacco Use   Smoking status: Former    Current packs/day: 0.00    Average packs/day: 0.5 packs/day for 20.0 years (10.0 ttl pk-yrs)    Types: Cigarettes    Start date: 12/1998    Quit date: 12/2018    Years since quitting: 3.9   Smokeless tobacco: Never  Vaping Use   Vaping status: Never  Used  Substance and Sexual Activity   Alcohol use: Yes    Alcohol/week: 3.0 standard drinks of alcohol    Types: 3 Cans of beer per week    Comment: twice week. NONE THIS WEEK   Drug use: No   Sexual activity: Yes    Birth control/protection: Post-menopausal  Other Topics Concern   Not on file  Social History Narrative   Not on file   Social Determinants of Health   Financial Resource Strain: High Risk (08/08/2022)   Received from [facility]   Overall Financial Resource Strain (CARDIA)    Difficulty of Paying Living Expenses: Hard  Food Insecurity: Food Insecurity Present (08/08/2022)   Received from [facility]   Hunger Vital Sign    Worried About Running Out of Food in the Last Year: Sometimes true    Ran Out of Food in the Last Year: Sometimes true  Transportation Needs: No Transportation Needs (08/08/2022)   Received from [facility] - Transportation    In the past 12 months, has lack of transportation kept you from medical appointments or from getting medications?: No    Lack of Transportation (Non-Medical): No  Physical Activity: Insufficiently Active (08/08/2022)    Received from [facility]   Exercise Vital Sign    Days of Exercise per Week: 5 days    Minutes of Exercise per Session: 20 min  Stress: Stress Concern Present (08/08/2022)   Received from [facility] - Occupational Stress Questionnaire    Feeling of Stress : To some extent  Social Connections: Socially Isolated (08/08/2022)   Received from [facility]   Social Connection and Isolation Panel [NHANES]    Frequency of Communication with Friends and Family: More than three times a week    Frequency of Social Gatherings with Friends and Family: More than three times a week    Attends Religious Services: Never    Database administrator or Organizations: No    Attends Banker Meetings: Never    Marital Status: Never married  Intimate Partner Violence: Not on file    Past Surgical History:  Procedure Laterality Date   BREAST BIOPSY Left 01/04/2012   CARDIAC VALVE REPLACEMENT     COLONOSCOPY WITH PROPOFOL N/A 04/30/2021   Procedure: COLONOSCOPY WITH PROPOFOL;  Surgeon: Regis Bill, MD;  Location: [facility];  Service: Endoscopy;  Laterality: N/A;   COLONOSCOPY WITH PROPOFOL N/A 08/13/2021   Procedure: COLONOSCOPY WITH PROPOFOL;  Surgeon: Regis Bill, MD;  Location: [facility];  Service: Endoscopy;  Laterality: N/A;   CORONARY ARTERY BYPASS GRAFT  12/13/2017   replacement mitral valve by Dr. Silvestre Mesi   INCISION AND DRAINAGE ABSCESS Right 09/04/2020   Procedure: INCISION AND DRAINAGE ABSCESS-Perianal;  Surgeon: Carolan Shiver, MD;  Location: [facility] OR[facility];  Service: General;  Laterality: Right;   LOWER EXTREMITY ANGIOGRAPHY Left 05/28/2015   Procedure: Lower Extremity Angiography;  Surgeon: Annice Needy, MD;  Location: [facility];  Service: Cardiovascular;  Laterality: Left;   LOWER EXTREMITY ANGIOGRAPHY Left 12/28/2021   Procedure: Lower Extremity  Angiography;  Surgeon: Annice Needy, MD;  Location: [facility];  Service: Cardiovascular;  Laterality: Left;   PERIPHERAL VASCULAR CATHETERIZATION Right 07/16/2015   Procedure: Lower Extremity Angiography;  Surgeon: Annice Needy, MD;  Location: [facility];  Service: Cardiovascular;  Laterality: Right;  PERIPHERAL VASCULAR CATHETERIZATION  07/16/2015   Procedure: Lower Extremity Intervention;  Surgeon: Annice Needy, MD;  Location: [facility];  Service: Cardiovascular;;   RIGHT/LEFT HEART CATH AND CORONARY ANGIOGRAPHY Bilateral 11/07/2017   Procedure: RIGHT/LEFT HEART CATH AND CORONARY ANGIOGRAPHY;  Surgeon: Lamar Blinks, MD;  Location: [facility];  Service: Cardiovascular;  Laterality: Bilateral;   TEE WITHOUT CARDIOVERSION N/A 11/07/2017   Procedure: TRANSESOPHAGEAL ECHOCARDIOGRAM (TEE);  Surgeon: Lamar Blinks, MD;  Location: [facility];  Service: Cardiovascular;  Laterality: N/A;   WISDOM TOOTH EXTRACTION      Family History  Problem Relation Age of Onset   Hypertension Mother    AAA (abdominal aortic aneurysm) Father    Other Father        bowel obstruction    Allergies  Allergen Reactions   Shellfish Allergy Anaphylaxis   Banana     Other reaction(s): Other (See Comments) Burning in the mouth   Garlic     Other reaction(s): Other (See Comments) Pt states arms freeze up and can't talk but no SOB   Isosorbide Nitrate Itching    Blurred vision   Fish-Derived Products Rash       Latest Ref Rng & Units 07/20/2022    2:36 PM 01/28/2021    6:05 PM 09/07/2020    4:50 AM  CBC  WBC 4.0 - 10.5 K/uL 6.5  7.8  12.6   Hemoglobin 12.0 - 15.0 g/dL 65.7  84.6  96.2   Hematocrit 36.0 - 46.0 % 44.7  48.9  34.2   Platelets 150 - 400 K/uL 236  246  260       CMP     Component Value Date/Time   NA 137 07/20/2022 1436   NA 138 01/20/2013 2320   K 4.0 07/20/2022 1436   K 4.1 01/20/2013 2320   CL 110 07/20/2022 1436   CL 108 (H) 01/20/2013  2320   CO2 22 07/20/2022 1436   CO2 20 (L) 01/20/2013 2320   GLUCOSE 126 (H) 07/20/2022 1436   GLUCOSE 165 (H) 01/20/2013 2320   BUN 13 07/20/2022 1436   BUN 17 01/20/2013 2320   CREATININE 0.87 07/20/2022 1436   CREATININE 1.14 01/20/2013 2320   CALCIUM 8.4 (L) 07/20/2022 1436   CALCIUM 9.2 01/20/2013 2320   PROT 6.8 09/03/2020 1400   PROT 8.5 (H) 01/20/2013 2320   ALBUMIN 2.9 (L) 09/03/2020 1400   ALBUMIN 3.7 01/20/2013 2320   AST 22 09/03/2020 1400   AST 14 (L) 01/20/2013 2320   ALT 15 09/03/2020 1400   ALT 22 01/20/2013 2320   ALKPHOS 75 09/03/2020 1400   ALKPHOS 105 01/20/2013 2320   BILITOT 1.1 09/03/2020 1400   BILITOT 0.4 01/20/2013 2320   GFRNONAA >60 07/20/2022 1436   GFRNONAA 55 (L) 01/20/2013 2320     VAS [place] ABI WITH/WO TBI  Result Date: 11/17/2022  LOWER EXTREMITY DOPPLER STUDY Patient Name:  SHANIE MACDONNELL  Date of Exam:   11/16/2022 Medical Rec #: [id number]         Accession #:    [id number] Date of Birth: Dec 30, 1960         Patient Gender: F Patient Age:   55 years Exam Location:  [facility] Procedure:      VAS [place] ABI WITH/WO TBI Referring Phys: Barbara Cower DEW --------------------------------------------------------------------------------  Indications: Claudication, and peripheral artery disease. Constant left foot  pain since stent placement High Risk Factors: Hyperlipidemia, past history of smoking, prior MI, coronary                    artery disease.  Vascular Interventions: 05/28/15: Left SFA/popliteal PTA/stent;                         07/16/15: Right SFA PTA/stent with right popliteal PTA;                         12/28/21: Left SFA/popliteal thrombectomy/stent x2;. Comparison Study: Prior duplex 06/10/2022 showed elevated velocities in the                   proximal right SFA. Performing Technologist: Hardie Lora RVT  Examination Guidelines: A complete evaluation includes at minimum, Doppler waveform signals and systolic blood pressure  reading at the level of bilateral brachial, anterior tibial, and posterior tibial arteries, when vessel segments are accessible. Bilateral testing is considered an integral part of a complete examination. Photoelectric Plethysmograph (PPG) waveforms and toe systolic pressure readings are included as required and additional duplex testing as needed. Limited examinations for reoccurring indications may be performed as noted.  ABI Findings: +---------+------------------+-----+----------+--------+ Right    Rt Pressure (mmHg)IndexWaveform  Comment  +---------+------------------+-----+----------+--------+ Brachial 135                                       +---------+------------------+-----+----------+--------+ PTA      117               0.84 monophasic         +---------+------------------+-----+----------+--------+ DP       109               0.78 biphasic           +---------+------------------+-----+----------+--------+ Great Toe93                0.67                    +---------+------------------+-----+----------+--------+ +---------+------------------+-----+----------+-------+ Left     Lt Pressure (mmHg)IndexWaveform  Comment +---------+------------------+-----+----------+-------+ Brachial 139                                      +---------+------------------+-----+----------+-------+ PTA      116               0.83 monophasic        +---------+------------------+-----+----------+-------+ DP       121               0.87 monophasic        +---------+------------------+-----+----------+-------+ Great Toe82                0.59                   +---------+------------------+-----+----------+-------+ +-------+-----------+-----------+------------+------------+ ABI/TBIToday's ABIToday's TBIPrevious ABIPrevious TBI +-------+-----------+-----------+------------+------------+ Right  0.84       0.67       0.73        0.90          +-------+-----------+-----------+------------+------------+ Left   0.87       0.59       0.94        0.75         +-------+-----------+-----------+------------+------------+  Right ABIs appear increased compared to prior study on 06/10/2022. Left ABIs appear essentially unchanged compared to prior study on 06/10/2022.  Summary: Right: Resting right ankle-brachial index indicates mild right lower extremity arterial disease. The right toe-brachial index is abnormal. Left: Resting left ankle-brachial index indicates mild left lower extremity arterial disease. The left toe-brachial index is abnormal. *See table(s) above for measurements and observations.  Electronically signed by Levora Dredge MD on 11/17/2022 at 3:34:57 PM.    Final        Assessment & Plan:   1. Atherosclerosis of native artery of left lower extremity with rest pain (HCC) Recommend:  The patient continues to have significant pain in her left lower extremity.  Her ABIs have reduced since her last follow-up visit.  She does have some stenosis within the left lower extremity from previous studies.  I suspect that the pain she is actually describing is neuropathic but in order to fully rule out any arterial involvement the best course of action would be for the patient undergo angiogram.  I discussed with patient that if her pain continues post angiogram it is likely related to neuropathic pain that she suffered from having an ischemic leg prior to intervention months ago.  Given the severity of the patient's severe left lower extremity symptoms the patient should undergo angiography with the hope for intervention.  Risk and benefits were reviewed the patient.  Indications for the procedure were reviewed.  All questions were answered, the patient agrees to proceed with left lower extremity angiography and possible intervention.   The patient should continue walking and begin a more formal exercise program.  The patient should  continue antiplatelet therapy and aggressive treatment of the lipid abnormalities  The patient will follow up with me after the angiogram.   2. Hyperlipidemia, unspecified hyperlipidemia type Continue statin as ordered and reviewed, no changes at this time   Current Outpatient Medications on File Prior to Visit  Medication Sig Dispense Refill   acetaminophen (TYLENOL) 500 MG tablet Take 2 tablets (1,000 mg total) by mouth every 6 (six) hours as needed. 30 tablet 0   atorvastatin (LIPITOR) 80 MG tablet Take 80 mg by mouth daily.     benzonatate (TESSALON) 200 MG capsule Take 1 capsule (200 mg total) by mouth 2 (two) times daily as needed for cough. 30 capsule 0   fluticasone (FLONASE) 50 MCG/ACT nasal spray Place 2 sprays into both nostrils daily.     folic acid (FOLVITE) 1 MG tablet Take by mouth.     furosemide (LASIX) 40 MG tablet      gabapentin (NEURONTIN) 100 MG capsule Take 100 mg by mouth 2 (two) times daily.     loratadine (CLARITIN) 10 MG tablet Take 10 mg by mouth daily.     nitroGLYCERIN (NITROSTAT) 0.4 MG SL tablet Place under the tongue.     nitroGLYCERIN (NITROSTAT) 0.4 MG SL tablet Place 0.4 mg under the tongue every 5 (five) minutes as needed for chest pain.     warfarin (COUMADIN) 6 MG tablet Take 6 mg by mouth at bedtime.     amLODipine (NORVASC) 5 MG tablet Take 5 mg by mouth daily.     aspirin EC 81 MG tablet Take 1 tablet (81 mg total) by mouth daily. Swallow whole. (Patient not taking: Reported on 06/10/2022) 150 tablet 2   clopidogrel (PLAVIX) 75 MG tablet Take 75 mg by mouth once.     EPINEPHrine 0.3 mg/0.3 mL IJ SOAJ injection Inject into  the muscle as directed. (Patient not taking: Reported on 01/28/2022)     ezetimibe (ZETIA) 10 MG tablet Take 10 mg by mouth daily. (Patient not taking: Reported on 12/28/2021)     [DISCONTINUED] cetirizine (ZYRTEC) 10 MG tablet Take 10 mg by mouth daily. Reported on 05/28/2015     No current facility-administered medications on file  prior to visit.    There are no Patient Instructions on file for this visit. No follow-ups on file.   Georgiana Spinner, NP

## 2022-11-21 ENCOUNTER — Encounter
Admission: AD | Disposition: A | Discharge status: Home / Self Care | Payer: Self-pay | Source: Ambulatory Visit | Attending: Vascular Surgery

## 2022-11-21 ENCOUNTER — Inpatient Hospital Stay
Admission: AD | Admit: 2022-11-21 | Discharge: 2022-11-24 | DRG: 271 | Disposition: A | Discharge status: Home / Self Care | Payer: Medicaid Other | Source: Ambulatory Visit | Attending: Vascular Surgery | Admitting: Vascular Surgery

## 2022-11-21 ENCOUNTER — Other Ambulatory Visit: Payer: Self-pay

## 2022-11-21 ENCOUNTER — Encounter: Payer: Self-pay | Admitting: Vascular Surgery

## 2022-11-21 DIAGNOSIS — I70219 Atherosclerosis of native arteries of extremities with intermittent claudication, unspecified extremity: Secondary | ICD-10-CM

## 2022-11-21 DIAGNOSIS — I2510 Atherosclerotic heart disease of native coronary artery without angina pectoris: Secondary | ICD-10-CM | POA: Diagnosis present

## 2022-11-21 DIAGNOSIS — Y712 Prosthetic and other implants, materials and accessory cardiovascular devices associated with adverse incidents: Secondary | ICD-10-CM | POA: Diagnosis present

## 2022-11-21 DIAGNOSIS — I509 Heart failure, unspecified: Secondary | ICD-10-CM | POA: Diagnosis present

## 2022-11-21 DIAGNOSIS — I7789 Other specified disorders of arteries and arterioles: Secondary | ICD-10-CM | POA: Diagnosis not present

## 2022-11-21 DIAGNOSIS — M069 Rheumatoid arthritis, unspecified: Secondary | ICD-10-CM | POA: Diagnosis present

## 2022-11-21 DIAGNOSIS — Z7902 Long term (current) use of antithrombotics/antiplatelets: Secondary | ICD-10-CM

## 2022-11-21 DIAGNOSIS — Z951 Presence of aortocoronary bypass graft: Secondary | ICD-10-CM | POA: Diagnosis not present

## 2022-11-21 DIAGNOSIS — I743 Embolism and thrombosis of arteries of the lower extremities: Secondary | ICD-10-CM | POA: Diagnosis not present

## 2022-11-21 DIAGNOSIS — Z952 Presence of prosthetic heart valve: Secondary | ICD-10-CM | POA: Diagnosis not present

## 2022-11-21 DIAGNOSIS — Z8249 Family history of ischemic heart disease and other diseases of the circulatory system: Secondary | ICD-10-CM

## 2022-11-21 DIAGNOSIS — T82868A Thrombosis of vascular prosthetic devices, implants and grafts, initial encounter: Secondary | ICD-10-CM | POA: Diagnosis present

## 2022-11-21 DIAGNOSIS — I723 Aneurysm of iliac artery: Secondary | ICD-10-CM | POA: Diagnosis present

## 2022-11-21 DIAGNOSIS — Z79899 Other long term (current) drug therapy: Secondary | ICD-10-CM | POA: Diagnosis not present

## 2022-11-21 DIAGNOSIS — J45909 Unspecified asthma, uncomplicated: Secondary | ICD-10-CM | POA: Diagnosis present

## 2022-11-21 DIAGNOSIS — I77819 Aortic ectasia, unspecified site: Secondary | ICD-10-CM

## 2022-11-21 DIAGNOSIS — Z9889 Other specified postprocedural states: Secondary | ICD-10-CM | POA: Diagnosis not present

## 2022-11-21 DIAGNOSIS — I252 Old myocardial infarction: Secondary | ICD-10-CM

## 2022-11-21 DIAGNOSIS — I272 Pulmonary hypertension, unspecified: Secondary | ICD-10-CM | POA: Diagnosis present

## 2022-11-21 DIAGNOSIS — M79605 Pain in left leg: Secondary | ICD-10-CM | POA: Diagnosis present

## 2022-11-21 DIAGNOSIS — I998 Other disorder of circulatory system: Principal | ICD-10-CM | POA: Diagnosis present

## 2022-11-21 DIAGNOSIS — I97618 Postprocedural hemorrhage and hematoma of a circulatory system organ or structure following other circulatory system procedure: Secondary | ICD-10-CM | POA: Diagnosis not present

## 2022-11-21 DIAGNOSIS — Y838 Other surgical procedures as the cause of abnormal reaction of the patient, or of later complication, without mention of misadventure at the time of the procedure: Secondary | ICD-10-CM | POA: Diagnosis not present

## 2022-11-21 DIAGNOSIS — I70222 Atherosclerosis of native arteries of extremities with rest pain, left leg: Secondary | ICD-10-CM | POA: Diagnosis present

## 2022-11-21 DIAGNOSIS — Z87891 Personal history of nicotine dependence: Secondary | ICD-10-CM | POA: Diagnosis not present

## 2022-11-21 DIAGNOSIS — K219 Gastro-esophageal reflux disease without esophagitis: Secondary | ICD-10-CM | POA: Diagnosis present

## 2022-11-21 DIAGNOSIS — I708 Atherosclerosis of other arteries: Secondary | ICD-10-CM

## 2022-11-21 DIAGNOSIS — Z7901 Long term (current) use of anticoagulants: Secondary | ICD-10-CM | POA: Diagnosis not present

## 2022-11-21 DIAGNOSIS — E7800 Pure hypercholesterolemia, unspecified: Secondary | ICD-10-CM | POA: Diagnosis present

## 2022-11-21 HISTORY — PX: LOWER EXTREMITY ANGIOGRAPHY: CATH118251

## 2022-11-21 LAB — CBC
HCT: 45.9 % (ref 36.0–46.0)
HCT: 41 % (ref 36.0–46.0)
HCT: 39.8 % (ref 36.0–46.0)
Hemoglobin: 14.6 g/dL (ref 12.0–15.0)
Hemoglobin: 13.2 g/dL (ref 12.0–15.0)
Hemoglobin: 12.9 g/dL (ref 12.0–15.0)
MCH: 28.2 pg (ref 26.0–34.0)
MCH: 28.1 pg (ref 26.0–34.0)
MCH: 29 pg (ref 26.0–34.0)
MCHC: 31.8 g/dL (ref 30.0–36.0)
MCHC: 32.2 g/dL (ref 30.0–36.0)
MCHC: 32.4 g/dL (ref 30.0–36.0)
MCV: 88.6 fL (ref 80.0–100.0)
MCV: 87.2 fL (ref 80.0–100.0)
MCV: 89.4 fL (ref 80.0–100.0)
Platelets: 180 10*3/uL (ref 150–400)
Platelets: 172 10*3/uL (ref 150–400)
Platelets: 173 10*3/uL (ref 150–400)
RBC: 5.18 MIL/uL — ABNORMAL HIGH (ref 3.87–5.11)
RBC: 4.7 MIL/uL (ref 3.87–5.11)
RBC: 4.45 MIL/uL (ref 3.87–5.11)
RDW: 13.2 % (ref 11.5–15.5)
RDW: 13.1 % (ref 11.5–15.5)
RDW: 13.2 % (ref 11.5–15.5)
WBC: 8.6 10*3/uL (ref 4.0–10.5)
WBC: 11.8 10*3/uL — ABNORMAL HIGH (ref 4.0–10.5)
WBC: 13.6 10*3/uL — ABNORMAL HIGH (ref 4.0–10.5)
nRBC: 0 % (ref 0.0–0.2)
nRBC: 0 % (ref 0.0–0.2)
nRBC: 0 % (ref 0.0–0.2)

## 2022-11-21 LAB — CREATININE, SERUM
Creatinine, Ser: 0.96 mg/dL (ref 0.44–1.00)
GFR, Estimated: >60 mL/min (ref >60)

## 2022-11-21 LAB — MRSA NEXT GEN BY PCR, NASAL: MRSA by PCR Next Gen: NOT DETECTED

## 2022-11-21 LAB — BUN: BUN: 12 mg/dL (ref 8–23)

## 2022-11-21 LAB — PROTIME-INR
INR: 1.7 — ABNORMAL HIGH (ref 0.8–1.2)
Prothrombin Time: 20.1 s — ABNORMAL HIGH (ref 11.4–15.2)

## 2022-11-21 LAB — HEPARIN LEVEL (UNFRACTIONATED)
Heparin Unfractionated: 0.35 [IU]/mL (ref 0.30–0.70)
Heparin Unfractionated: 0.14 [IU]/mL — ABNORMAL LOW (ref 0.30–0.70)

## 2022-11-21 LAB — FIBRINOGEN
Fibrinogen: 357 mg/dL (ref 210–475)
Fibrinogen: 203 mg/dL — ABNORMAL LOW (ref 210–475)

## 2022-11-21 LAB — GLUCOSE, CAPILLARY: Glucose-Capillary: 124 mg/dL — ABNORMAL HIGH (ref 70–99)

## 2022-11-21 SURGERY — LOWER EXTREMITY ANGIOGRAPHY
Anesthesia: Moderate Sedation | Site: Leg Lower | Laterality: Left

## 2022-11-21 MED ORDER — HEPARIN (PORCINE) 25000 UT/250ML-% IV SOLN
INTRAVENOUS | Status: AC
  Administered 2022-11-21: 600 [IU]/h via INTRA_ARTERIAL
  Filled 2022-11-21: qty 250

## 2022-11-21 MED ORDER — HEPARIN SODIUM (PORCINE) 1000 UNIT/ML IJ SOLN
INTRAMUSCULAR | Status: DC | PRN
  Administered 2022-11-21: 5000 [IU] via INTRAVENOUS

## 2022-11-21 MED ORDER — EZETIMIBE 10 MG PO TABS
10.0000 mg | ORAL_TABLET | Freq: Every day | ORAL | Status: DC
  Administered 2022-11-22 – 2022-11-24 (×3): 10 mg via ORAL
  Filled 2022-11-21 (×3): qty 1

## 2022-11-21 MED ORDER — HYDROCODONE-ACETAMINOPHEN 5-325 MG PO TABS
1.0000 | ORAL_TABLET | Freq: Four times a day (QID) | ORAL | Status: DC | PRN
Start: 2022-11-21 — End: 2022-11-24
  Administered 2022-11-21: 2 via ORAL
  Administered 2022-11-22: 1 via ORAL
  Administered 2022-11-22: 2 via ORAL
  Administered 2022-11-23 (×2): 1 via ORAL
  Filled 2022-11-21 (×2): qty 1
  Filled 2022-11-21: qty 2
  Filled 2022-11-21: qty 1
  Filled 2022-11-21: qty 2

## 2022-11-21 MED ORDER — MIDAZOLAM HCL 2 MG/2ML IJ SOLN
INTRAMUSCULAR | Status: DC | PRN
  Administered 2022-11-21: 2 mg via INTRAVENOUS
  Administered 2022-11-21 (×2): .5 mg via INTRAVENOUS

## 2022-11-21 MED ORDER — SODIUM CHLORIDE 0.9 % IV SOLN
1.0000 mg/h | INTRAVENOUS | Status: AC
  Administered 2022-11-21: 1 mg/h
  Filled 2022-11-21: qty 10

## 2022-11-21 MED ORDER — METHYLPREDNISOLONE SODIUM SUCC 125 MG IJ SOLR: 125.0000 mg | Freq: Once | INTRAMUSCULAR | Status: AC | PRN

## 2022-11-21 MED ORDER — HEPARIN (PORCINE) 25000 UT/250ML-% IV SOLN: 600.0000 [IU]/h | INTRAVENOUS | Status: DC

## 2022-11-21 MED ORDER — SODIUM CHLORIDE 0.9% FLUSH: 3.0000 mL | INTRAVENOUS | Status: DC | PRN

## 2022-11-21 MED ORDER — GABAPENTIN 100 MG PO CAPS
100.0000 mg | ORAL_CAPSULE | Freq: Two times a day (BID) | ORAL | Status: DC
  Administered 2022-11-21 – 2022-11-24 (×7): 100 mg via ORAL
  Filled 2022-11-21 (×7): qty 1

## 2022-11-21 MED ORDER — FLUTICASONE PROPIONATE 50 MCG/ACT NA SUSP
2.0000 | Freq: Every day | NASAL | Status: DC
  Administered 2022-11-23: 2 via NASAL
  Filled 2022-11-21: qty 16

## 2022-11-21 MED ORDER — HEPARIN SODIUM (PORCINE) 1000 UNIT/ML IJ SOLN
INTRAMUSCULAR | Status: AC
  Filled 2022-11-21: qty 10

## 2022-11-21 MED ORDER — MIDAZOLAM HCL 2 MG/ML PO SYRP: 8.0000 mg | ORAL_SOLUTION | Freq: Once | ORAL | Status: DC | PRN

## 2022-11-21 MED ORDER — FAMOTIDINE 20 MG PO TABS
ORAL_TABLET | ORAL | Status: AC
  Administered 2022-11-21: 40 mg via ORAL
  Filled 2022-11-21: qty 2

## 2022-11-21 MED ORDER — ALTEPLASE 2 MG IJ SOLR
INTRAMUSCULAR | Status: AC
  Filled 2022-11-21: qty 8

## 2022-11-21 MED ORDER — SODIUM CHLORIDE 0.9 % IV SOLN: 250.0000 mL | INTRAVENOUS | Status: AC | PRN

## 2022-11-21 MED ORDER — LIDOCAINE-EPINEPHRINE (PF) 1 %-1:200000 IJ SOLN
INTRAMUSCULAR | Status: DC | PRN
  Administered 2022-11-21: 10 mL

## 2022-11-21 MED ORDER — IODIXANOL 320 MG/ML IV SOLN
INTRAVENOUS | Status: DC | PRN
  Administered 2022-11-21: 40 mL

## 2022-11-21 MED ORDER — SODIUM CHLORIDE 0.9 % IV SOLN
0.5000 mg/h | INTRAVENOUS | Status: DC
  Administered 2022-11-22: 0.5 mg/h
  Filled 2022-11-21 (×2): qty 10

## 2022-11-21 MED ORDER — ALTEPLASE 1 MG/ML SYRINGE FOR VASCULAR PROCEDURE
INTRAMUSCULAR | Status: DC | PRN
  Administered 2022-11-21: 8 mg via INTRA_ARTERIAL

## 2022-11-21 MED ORDER — FAMOTIDINE 20 MG PO TABS: 40.0000 mg | ORAL_TABLET | Freq: Once | ORAL | Status: AC | PRN

## 2022-11-21 MED ORDER — FENTANYL CITRATE (PF) 100 MCG/2ML IJ SOLN
INTRAMUSCULAR | Status: AC
  Filled 2022-11-21: qty 2

## 2022-11-21 MED ORDER — DIPHENHYDRAMINE HCL 50 MG/ML IJ SOLN: 50.0000 mg | Freq: Once | INTRAMUSCULAR | Status: AC | PRN

## 2022-11-21 MED ORDER — CHLORHEXIDINE GLUCONATE CLOTH 2 % EX PADS
6.0000 | MEDICATED_PAD | Freq: Every day | CUTANEOUS | Status: DC
  Administered 2022-11-21: 6 via TOPICAL

## 2022-11-21 MED ORDER — CEFAZOLIN SODIUM-DEXTROSE 2-4 GM/100ML-% IV SOLN
2.0000 g | INTRAVENOUS | Status: AC
  Administered 2022-11-21: 2 g via INTRAVENOUS

## 2022-11-21 MED ORDER — METHYLPREDNISOLONE SODIUM SUCC 125 MG IJ SOLR
INTRAMUSCULAR | Status: AC
  Administered 2022-11-21: 125 mg via INTRAVENOUS
  Filled 2022-11-21: qty 2

## 2022-11-21 MED ORDER — HEPARIN (PORCINE) IN NACL 1000-0.9 UT/500ML-% IV SOLN
INTRAVENOUS | Status: DC | PRN
  Administered 2022-11-21: 1000 mL

## 2022-11-21 MED ORDER — MORPHINE SULFATE (PF) 2 MG/ML IV SOLN
2.0000 mg | INTRAVENOUS | Status: DC | PRN
  Administered 2022-11-21 (×2): 2 mg via INTRAVENOUS
  Filled 2022-11-21 (×2): qty 1

## 2022-11-21 MED ORDER — SODIUM CHLORIDE 0.9 % IV SOLN: INTRAVENOUS | Status: DC

## 2022-11-21 MED ORDER — ASPIRIN 81 MG PO TBEC
81.0000 mg | DELAYED_RELEASE_TABLET | Freq: Every day | ORAL | Status: DC
  Administered 2022-11-21 – 2022-11-24 (×4): 81 mg via ORAL
  Filled 2022-11-21 (×4): qty 1

## 2022-11-21 MED ORDER — NITROGLYCERIN 0.4 MG SL SUBL: 0.4000 mg | SUBLINGUAL_TABLET | SUBLINGUAL | Status: DC | PRN

## 2022-11-21 MED ORDER — DIPHENHYDRAMINE HCL 50 MG/ML IJ SOLN
INTRAMUSCULAR | Status: AC
  Administered 2022-11-21: 50 mg via INTRAVENOUS
  Filled 2022-11-21: qty 1

## 2022-11-21 MED ORDER — LORATADINE 10 MG PO TABS
10.0000 mg | ORAL_TABLET | Freq: Every day | ORAL | Status: DC
  Administered 2022-11-21 – 2022-11-24 (×4): 10 mg via ORAL
  Filled 2022-11-21 (×4): qty 1

## 2022-11-21 MED ORDER — HYDROMORPHONE HCL 1 MG/ML IJ SOLN: 1.0000 mg | Freq: Once | INTRAMUSCULAR | Status: DC | PRN

## 2022-11-21 MED ORDER — ONDANSETRON HCL 4 MG/2ML IJ SOLN
4.0000 mg | Freq: Four times a day (QID) | INTRAMUSCULAR | Status: DC | PRN
  Administered 2022-11-22: 4 mg via INTRAVENOUS
  Filled 2022-11-21: qty 2

## 2022-11-21 MED ORDER — FOLIC ACID 1 MG PO TABS
1.0000 mg | ORAL_TABLET | Freq: Every day | ORAL | Status: DC
  Administered 2022-11-22 – 2022-11-24 (×3): 1 mg via ORAL
  Filled 2022-11-21 (×3): qty 1

## 2022-11-21 MED ORDER — SODIUM CHLORIDE 0.9% FLUSH
3.0000 mL | Freq: Two times a day (BID) | INTRAVENOUS | Status: DC
  Administered 2022-11-21 – 2022-11-24 (×6): 3 mL via INTRAVENOUS

## 2022-11-21 MED ORDER — CEFAZOLIN SODIUM-DEXTROSE 2-4 GM/100ML-% IV SOLN
INTRAVENOUS | Status: AC
  Filled 2022-11-21: qty 100

## 2022-11-21 MED ORDER — FENTANYL CITRATE (PF) 100 MCG/2ML IJ SOLN
INTRAMUSCULAR | Status: DC | PRN
  Administered 2022-11-21: 50 ug via INTRAVENOUS
  Administered 2022-11-21 (×2): 25 ug via INTRAVENOUS

## 2022-11-21 MED ORDER — AMLODIPINE BESYLATE 5 MG PO TABS
5.0000 mg | ORAL_TABLET | Freq: Every day | ORAL | Status: DC
  Administered 2022-11-21 – 2022-11-24 (×4): 5 mg via ORAL
  Filled 2022-11-21 (×4): qty 1

## 2022-11-21 MED ORDER — ATORVASTATIN CALCIUM 20 MG PO TABS
80.0000 mg | ORAL_TABLET | Freq: Every day | ORAL | Status: DC
Start: 2022-11-22 — End: 2022-11-24
  Administered 2022-11-22 – 2022-11-24 (×3): 80 mg via ORAL
  Filled 2022-11-21 (×3): qty 4
  Filled 2022-11-21: qty 1

## 2022-11-21 MED ORDER — MIDAZOLAM HCL 5 MG/5ML IJ SOLN
INTRAMUSCULAR | Status: AC
  Filled 2022-11-21: qty 5

## 2022-11-21 SURGICAL SUPPLY — 24 items
BALLN LUTONIX 018 4X100X130 (BALLOONS) ×1
BALLN ULTRVRSE 3X300X150 (BALLOONS) ×1
BALLN ULTRVRSE 3X300X150 OTW (BALLOONS) ×1
BALLOON LUTONIX 018 4X100X130 (BALLOONS) IMPLANT
BALLOON ULTRVRSE 3X300X150 OTW (BALLOONS) IMPLANT
BIOPATCH RED 1 DISK 7.0 (GAUZE/BANDAGES/DRESSINGS) IMPLANT
CATH ANGIO 5F PIGTAIL 65CM (CATHETERS) IMPLANT
CATH BEACON 5 .035 65 KMP TIP (CATHETERS) IMPLANT
CATH BEACON 5 .038 100 VERT TP (CATHETERS) IMPLANT
CATH INFUS 135X50 (CATHETERS) IMPLANT
CATH ROTAREX 135 6FR (CATHETERS) IMPLANT
COVER PROBE ULTRASOUND 5X96 (MISCELLANEOUS) IMPLANT
DEVICE PRESTO INFLATION (MISCELLANEOUS) IMPLANT
GLIDEWIRE ADV .035X260CM (WIRE) IMPLANT
KIT CV MULTILUMEN 7FR 20 (SET/KITS/TRAYS/PACK) ×1
KIT CV MULTILUMEN 7FR 20 SUB (SET/KITS/TRAYS/PACK) IMPLANT
PACK ANGIOGRAPHY (CUSTOM PROCEDURE TRAY) ×1 IMPLANT
SHEATH BRITE TIP 5FRX11 (SHEATH) IMPLANT
SHEATH PINNACLE MP 6F 45CM (SHEATH) IMPLANT
SUT PROLENE 0 CT 1 30 (SUTURE) IMPLANT
SYR MEDRAD MARK 7 150ML (SYRINGE) IMPLANT
TUBING CONTRAST HIGH PRESS 72 (TUBING) IMPLANT
WIRE G V18X300CM (WIRE) IMPLANT
WIRE GUIDERIGHT .035X150 (WIRE) IMPLANT

## 2022-11-21 NOTE — Progress Notes (Signed)
Assumed care of this patient. Hight risk medications infusing at the following rates:  Heparin at 6 ml/hr or 600 units/hr Alteplase at 0.5 mg/hr or 25 ml/hr.  Will continue to monitor.

## 2022-11-21 NOTE — Plan of Care (Signed)

## 2022-11-21 NOTE — Plan of Care (Signed)
  Problem: Pain Management: Goal: General experience of comfort will improve Outcome: Progressing

## 2022-11-21 NOTE — Op Note (Signed)
[facility]  Percutaneous Study/Intervention Procedural Note   Date of Surgery: 11/21/2022  Surgeon(s):Senai Ramnath    Assistants:none  Pre-operative Diagnosis: PAD with LLE, subacute on chronic ischemia  Post-operative diagnosis:  Same  Procedure(s) Performed:             1.  Ultrasound guidance for vascular access right femoral artery             2.  Catheter placement into left common femoral artery from right femoral approach             3.  Aortogram and selective left lower extremity angiogram             4.  Mechanical thrombectomy of the left SFA, popliteal artery, and tibioperoneal trunk with the [place] Rex device             5.  Percutaneous transluminal angioplasty of left posterior tibial artery and tibioperoneal trunk with 3 mm diameter by 30 cm length angioplasty balloon  6.  Percutaneous transluminal angioplasty of left popliteal artery with 4 mm diameter by 10 cm length Lutonix drug-coated angioplasty balloon             7.  Catheter directed thrombolytic therapy with 8 mg of tPA instilled in the left SFA, popliteal artery, and tibioperoneal trunk and placement of a continuous infusion catheter for continued thrombolytic therapy with a 135 cm total length 50 cm working length catheter and these vessels  8.   Ultrasound guidance for vascular access right femoral vein  9.   Placement of a right femoral venous triple-lumen catheter  EBL: 100 cc  Contrast: 40 cc  Fluoro Time: 9.8 minutes  Moderate Conscious Sedation Time: approximately 51 minutes using 3 mg of Versed and 100 mcg of Fentanyl              Indications:  Patient is a 62 y.o.female with recurrent rest pain symptoms of the left lower extremity after previous intervention. The patient has noninvasive study showing a drop in her ABI worrisome for thrombosis of her previous interventions. The patient is brought in for angiography for further evaluation and potential treatment.  Due to the limb  threatening nature of the situation, angiogram was performed for attempted limb salvage. The patient is aware that if the procedure fails, amputation would be expected.  The patient also understands that even with successful revascularization, amputation may still be required due to the severity of the situation.  Risks and benefits are discussed and informed consent is obtained.   Procedure:  The patient was identified and appropriate procedural time out was performed.  The patient was then placed supine on the table and prepped and draped in the usual sterile fashion. Moderate conscious sedation was administered during a face to face encounter with the patient throughout the procedure with my supervision of the RN administering medicines and monitoring the patient's vital signs, pulse oximetry, telemetry and mental status throughout from the start of the procedure until the patient was taken to the recovery room. Ultrasound was used to evaluate the right common femoral artery.  It was patent .  A digital ultrasound image was acquired.  A Seldinger needle was used to access the right common femoral artery under direct ultrasound guidance and a permanent image was performed.  A 0.035 J wire was advanced without resistance and a 5Fr sheath was placed.  Pigtail catheter was placed into the aorta and an AP aortogram was performed. This demonstrated normal  renal arteries and a mildly ectatic aorta.  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  Both hypogastric arteries appear to be occluded.  No focal stenosis was identified in the right iliac system.  The left common iliac artery was patent, but the left distal external iliac artery had about a 70% stenosis. I then crossed the aortic bifurcation and advanced to the left femoral head. Selective left lower extremity angiogram was then performed. This demonstrated flush occlusion of the left SFA where the previous stents have been placed almost to  the origin of the left SFA. There was extensive thrombosis of the entire left SFA and popliteal system much within previously placed covered stents that would represent a thrombotic reocclusion. The profunda femoris and common femoral arteries were patent below the iliac stenosis.  There was reconstitution of what appeared to be a peroneal artery, posterior tibial artery, and anterior tibial artery after occlusions of the tibioperoneal trunk and origins of the 3 tibial vessels.  All tibial vessels were also quite small due to the poor perfusion.  It was felt that it was in the patient's best interest to proceed with intervention after these images to avoid a second procedure and a larger amount of contrast and fluoroscopy based off of the findings from the initial angiogram. The patient was systemically heparinized and a 6 [place] destination sheath was then placed over the Terumo Advantage wire. I then used a Kumpe catheter and the advantage wire to get into the occluded SFA and cross the long segment occlusion without difficulty confirming intraluminal flow in the posterior tibial artery distally.  I then began by performing extensive mechanical thrombectomy with multiple passes with the [place] Rex device throughout the entire left SFA, popliteal artery, and down into the tibioperoneal trunk.  Although a large volume of thrombus was removed with thrombectomy, imaging through the sheath still showed essentially no flow.  There was very tight narrowing in the popliteal artery and tibioperoneal trunk that I had difficulty getting the [place] Rex device to pass through initially, so I felt trying to treat these areas with angioplasty may provide better outflow and hope of restored patency.  A 3 mm diameter by 30 cm length angioplasty balloon was then inflated from the mid to distal posterior tibial artery up through the tibioperoneal trunk into the distal popliteal artery and inflated to 10 atm for 1 minute.  A 4 mm  diameter by 10 cm length Lutonix drug-coated angioplasty balloon were inflated in the mid to distal popliteal artery and taken up to 8 atm for 1 minute.  Completion imaging following this showed residual continued thrombosis throughout and I felt our only hope at restoring patency in an endovascular fashion would be continuous thrombolytic therapy.  I then placed a 135 cm total length 50 cm working length thrombolytic catheter with the proximal extent in the left common femoral artery and traversing through the entire left SFA and popliteal arteries terminating in the tibioperoneal trunk.  8 mg of tPA were then instilled and this catheter and the catheter and sheath were secured in place with Prolene sutures.  With continuous thrombolytic therapy, durable venous access is required for blood draws and avoidance of sticks, as well as ensuring good venous access in case of bleeding.  The right femoral vein was visualized with ultrasound and found to be widely patent.  It was then accessed under direct ultrasound guidance without difficulty with a Seldinger needle and a permanent image was recorded.  A J-wire was placed.  After skin dilatation a triple-lumen catheter was placed over the wire and the wire was removed.  All 3 lm withdrew dark red nonpulsatile blood and flushed easily with sterile saline.  It was then secured into place with silk sutures. The patient was taken to the recovery room in stable condition having tolerated the procedure well.  Findings:               Aortogram:  This demonstrated normal renal arteries and a mildly ectatic aorta.  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  Both hypogastric arteries appear to be occluded.  No focal stenosis was identified in the right iliac system.  The left common iliac artery was patent, but the left distal external iliac artery had about a 70% stenosis.             Left lower Extremity:  This demonstrated flush occlusion of the left  SFA where the previous stents have been placed almost to the origin of the left SFA.  The profunda femoris and common femoral arteries were patent below the iliac stenosis.  There was extensive thrombosis of the entire left SFA and popliteal system much within previously placed covered stents that would represent a thrombotic reocclusion.  There was reconstitution of what appeared to be a peroneal artery, posterior tibial artery, and anterior tibial artery after occlusions of the tibioperoneal trunk and origins of the 3 tibial vessels.  All tibial vessels were also quite small due to the poor perfusion.    Disposition: Patient was taken to the recovery room in stable condition having tolerated the procedure well.  Complications: None  Festus Barren 11/21/2022 10:15 AM   This note was created with Dragon Medical transcription system. Any errors in dictation are purely unintentional.

## 2022-11-21 NOTE — Interval H&P Note (Signed)
History and Physical Interval Note:  11/21/2022 8:15 AM  Theresa Phelps  has presented today for surgery, with the diagnosis of LLE Angio   ASO w claudication.  The various methods of treatment have been discussed with the patient and family. After consideration of risks, benefits and other options for treatment, the patient has consented to  Procedure(s): Lower Extremity Angiography (Left) as a surgical intervention.  The patient's history has been reviewed, patient examined, no change in status, stable for surgery.  I have reviewed the patient's chart and labs.  Questions were answered to the patient's satisfaction.     Festus Barren

## 2022-11-22 ENCOUNTER — Encounter: Payer: Self-pay | Admitting: Vascular Surgery

## 2022-11-22 ENCOUNTER — Encounter: Payer: Self-pay | Admitting: Certified Registered Nurse Anesthetist

## 2022-11-22 ENCOUNTER — Encounter
Admission: AD | Disposition: A | Discharge status: Home / Self Care | Payer: Self-pay | Source: Ambulatory Visit | Attending: Vascular Surgery

## 2022-11-22 HISTORY — PX: LOWER EXTREMITY ANGIOGRAPHY: CATH118251

## 2022-11-22 LAB — BASIC METABOLIC PANEL
Anion gap: 5 (ref 5–15)
BUN: 11 mg/dL (ref 8–23)
CO2: 22 mmol/L (ref 22–32)
Calcium: 8 mg/dL — ABNORMAL LOW (ref 8.9–10.3)
Chloride: 106 mmol/L (ref 98–111)
Creatinine, Ser: 0.83 mg/dL (ref 0.44–1.00)
GFR, Estimated: >60 mL/min (ref >60)
Glucose, Bld: 226 mg/dL — ABNORMAL HIGH (ref 70–99)
Potassium: 4 mmol/L (ref 3.5–5.1)
Sodium: 133 mmol/L — ABNORMAL LOW (ref 135–145)

## 2022-11-22 LAB — CBC
HCT: 37.8 % (ref 36.0–46.0)
Hemoglobin: 12.3 g/dL (ref 12.0–15.0)
MCH: 28.4 pg (ref 26.0–34.0)
MCHC: 32.5 g/dL (ref 30.0–36.0)
MCV: 87.3 fL (ref 80.0–100.0)
Platelets: 164 10*3/uL (ref 150–400)
RBC: 4.33 MIL/uL (ref 3.87–5.11)
RDW: 13.1 % (ref 11.5–15.5)
WBC: 17.5 10*3/uL — ABNORMAL HIGH (ref 4.0–10.5)
nRBC: 0 % (ref 0.0–0.2)

## 2022-11-22 LAB — HEPARIN LEVEL (UNFRACTIONATED)
Heparin Unfractionated: <0.1 [IU]/mL — ABNORMAL LOW (ref 0.30–0.70)
Heparin Unfractionated: 0.1 [IU]/mL — ABNORMAL LOW (ref 0.30–0.70)

## 2022-11-22 LAB — MAGNESIUM: Magnesium: 2 mg/dL (ref 1.7–2.4)

## 2022-11-22 LAB — FIBRINOGEN
Fibrinogen: 93 mg/dL — CL (ref 210–475)
Fibrinogen: 73 mg/dL — CL (ref 210–475)

## 2022-11-22 LAB — PHOSPHORUS: Phosphorus: 2.5 mg/dL (ref 2.5–4.6)

## 2022-11-22 SURGERY — LOWER EXTREMITY ANGIOGRAPHY
Anesthesia: Moderate Sedation | Laterality: Left

## 2022-11-22 MED ORDER — METHYLPREDNISOLONE SODIUM SUCC 125 MG IJ SOLR: 125.0000 mg | INTRAMUSCULAR | Status: AC

## 2022-11-22 MED ORDER — TIROFIBAN (AGGRASTAT) BOLUS VIA INFUSION
25.0000 ug/kg | Freq: Once | INTRAVENOUS | Status: AC
  Filled 2022-11-22: qty 51

## 2022-11-22 MED ORDER — DIPHENHYDRAMINE HCL 50 MG/ML IJ SOLN: 50.0000 mg | Freq: Once | INTRAMUSCULAR | Status: AC

## 2022-11-22 MED ORDER — DIPHENHYDRAMINE HCL 50 MG/ML IJ SOLN
INTRAMUSCULAR | Status: AC
  Administered 2022-11-22: 50 mg via INTRAVENOUS
  Filled 2022-11-22: qty 1

## 2022-11-22 MED ORDER — TIROFIBAN HCL IV 12.5 MG/250 ML
INTRAVENOUS | Status: AC
  Administered 2022-11-22: 2545 ug via INTRAVENOUS
  Filled 2022-11-22: qty 250

## 2022-11-22 MED ORDER — IODIXANOL 320 MG/ML IV SOLN
INTRAVENOUS | Status: DC | PRN
  Administered 2022-11-22: 45 mL

## 2022-11-22 MED ORDER — FENTANYL CITRATE (PF) 100 MCG/2ML IJ SOLN
INTRAMUSCULAR | Status: AC
  Filled 2022-11-22: qty 2

## 2022-11-22 MED ORDER — CHLORHEXIDINE GLUCONATE CLOTH 2 % EX PADS
6.0000 | MEDICATED_PAD | Freq: Every day | CUTANEOUS | Status: DC
  Administered 2022-11-22 – 2022-11-23 (×2): 6 via TOPICAL

## 2022-11-22 MED ORDER — TIROFIBAN HCL IN NACL 5-0.9 MG/100ML-% IV SOLN
0.1500 ug/kg/min | INTRAVENOUS | Status: DC
  Administered 2022-11-22 (×2): 0.15 ug/kg/min via INTRAVENOUS
  Filled 2022-11-22 (×4): qty 100

## 2022-11-22 MED ORDER — MIDAZOLAM HCL 5 MG/5ML IJ SOLN
INTRAMUSCULAR | Status: AC
  Filled 2022-11-22: qty 5

## 2022-11-22 MED ORDER — MIDAZOLAM HCL 2 MG/2ML IJ SOLN
INTRAMUSCULAR | Status: DC | PRN
  Administered 2022-11-22: 2 mg via INTRAVENOUS
  Administered 2022-11-22: 1 mg via INTRAVENOUS

## 2022-11-22 MED ORDER — LIDOCAINE-EPINEPHRINE (PF) 1 %-1:200000 IJ SOLN
INTRAMUSCULAR | Status: DC | PRN
  Administered 2022-11-22: 10 mL

## 2022-11-22 MED ORDER — ATROPINE SULFATE 1 MG/10ML IJ SOSY: 0.5000 mg | PREFILLED_SYRINGE | INTRAMUSCULAR | Status: DC | PRN

## 2022-11-22 MED ORDER — WARFARIN - PHARMACIST DOSING INPATIENT: Freq: Every day | Status: DC

## 2022-11-22 MED ORDER — HEPARIN (PORCINE) IN NACL 1000-0.9 UT/500ML-% IV SOLN
INTRAVENOUS | Status: DC | PRN
  Administered 2022-11-22: 1000 mL

## 2022-11-22 MED ORDER — SODIUM CHLORIDE 0.9 % IV SOLN: INTRAVENOUS | Status: DC

## 2022-11-22 MED ORDER — FENTANYL CITRATE (PF) 100 MCG/2ML IJ SOLN
INTRAMUSCULAR | Status: DC | PRN
  Administered 2022-11-22: 50 ug via INTRAVENOUS
  Administered 2022-11-22: 25 ug via INTRAVENOUS

## 2022-11-22 MED ORDER — ATROPINE SULFATE 1 MG/10ML IJ SOSY
PREFILLED_SYRINGE | INTRAMUSCULAR | Status: AC
  Filled 2022-11-22: qty 10

## 2022-11-22 MED ORDER — WARFARIN SODIUM 6 MG PO TABS: 6.0000 mg | ORAL_TABLET | Freq: Every day | ORAL | Status: DC

## 2022-11-22 MED ORDER — HEPARIN SODIUM (PORCINE) 1000 UNIT/ML IJ SOLN
INTRAMUSCULAR | Status: AC
  Filled 2022-11-22: qty 10

## 2022-11-22 MED ORDER — FAMOTIDINE 20 MG PO TABS
ORAL_TABLET | ORAL | Status: AC
  Administered 2022-11-22: 40 mg via ORAL
  Filled 2022-11-22: qty 2

## 2022-11-22 MED ORDER — FAMOTIDINE 20 MG PO TABS: 20.0000 mg | ORAL_TABLET | ORAL | Status: DC

## 2022-11-22 MED ORDER — METHYLPREDNISOLONE SODIUM SUCC 125 MG IJ SOLR
INTRAMUSCULAR | Status: AC
  Administered 2022-11-22: 125 mg via INTRAVENOUS
  Filled 2022-11-22: qty 2

## 2022-11-22 MED ORDER — FAMOTIDINE 20 MG PO TABS: 40.0000 mg | ORAL_TABLET | ORAL | Status: AC

## 2022-11-22 MED ORDER — CEFAZOLIN SODIUM-DEXTROSE 2-4 GM/100ML-% IV SOLN
2.0000 g | INTRAVENOUS | Status: AC
  Administered 2022-11-22: 2 g via INTRAVENOUS
  Filled 2022-11-22: qty 100

## 2022-11-22 MED ORDER — WARFARIN SODIUM 6 MG PO TABS
6.0000 mg | ORAL_TABLET | Freq: Every day | ORAL | Status: DC
  Administered 2022-11-22: 6 mg via ORAL
  Filled 2022-11-22 (×2): qty 1

## 2022-11-22 MED ORDER — HEPARIN SODIUM (PORCINE) 1000 UNIT/ML IJ SOLN
INTRAMUSCULAR | Status: DC | PRN
  Administered 2022-11-22: 3000 [IU] via INTRAVENOUS

## 2022-11-22 SURGICAL SUPPLY — 18 items
BALLN LUTONIX 018 4X100X130 (BALLOONS) ×1
BALLN LUTONIX DCB 6X40X130 (BALLOONS) ×1
BALLN LUTONIX DCB 7X40X130 (BALLOONS) ×1
BALLN ULTRVRSE 3X150X150 (BALLOONS) ×1
BALLOON LUTONIX 018 4X100X130 (BALLOONS) IMPLANT
BALLOON LUTONIX DCB 6X40X130 (BALLOONS) IMPLANT
BALLOON LUTONIX DCB 7X40X130 (BALLOONS) IMPLANT
BALLOON ULTRVRSE 3X150X150 (BALLOONS) IMPLANT
CANISTER PENUMBRA ENGINE (MISCELLANEOUS) IMPLANT
CATH NAVICROSS ANGLED 135CM (MICROCATHETER) IMPLANT
CATH VERT 5X100 (CATHETERS) IMPLANT
COVER DRAPE FLUORO 36X44 (DRAPES) IMPLANT
DEVICE PRESTO INFLATION (MISCELLANEOUS) IMPLANT
DEVICE STARCLOSE SE CLOSURE (Vascular Products) IMPLANT
PACK ANGIOGRAPHY (CUSTOM PROCEDURE TRAY) ×1 IMPLANT
WIRE G V18X300CM (WIRE) IMPLANT
WIRE GUIDERIGHT .035X150 (WIRE) IMPLANT
WIRE SUPRACORE 300CM (WIRE) IMPLANT

## 2022-11-22 NOTE — Consult Note (Signed)
PHARMACY - ANTICOAGULATION CONSULT NOTE  Pharmacy Consult for Warfarin Indication:  Mechanical mitral valve  Patient Measurements: Height: 5\' 10"  (177.8 cm) Weight: 101.8 kg (224 lb 6.9 oz) IBW/kg (Calculated) : 68.5 Heparin Dosing Weight: 90.5 kg  Labs: Recent Labs    11/21/22 0838 11/21/22 1320 11/21/22 1657 11/21/22 2248 11/22/22 0454  HGB  --    < > 13.2 12.9 12.3  HCT  --    < > 41.0 39.8 37.8  PLT  --    < > 172 173 164  LABPROT  --   --  20.1*  --   --   INR  --   --  1.7*  --   --   HEPARINUNFRC  --    < > 0.14* <0.10* 0.10*  CREATININE 0.96  --   --   --   --    < > = values in this interval not displayed.    Estimated Creatinine Clearance: 77.5 mL/min (by C-G formula based on SCr of 0.96 mg/dL).  Medical History: Past Medical History:  Diagnosis Date   Acid reflux    Arthritis    rheumatoid arthritis   Asthma    Carpal tunnel syndrome    LEFT   CHF (congestive heart failure) (HCC)    Coronary artery disease    High cholesterol    Myocardial infarction [facility])    Peripheral vascular disease (HCC)    Pre-diabetes    Pulmonary hypertension (HCC)    Severe mitral regurgitation    Medications:  Warfarin 6 mg daily PTA per cardiology office 10/14/22  Assessment: 62 y/o F with medical history as above and including mechanical mitral valve on warfarin and PAD admitted for limb ischemia now s/p endovascular intervention. Pharmacy consulted to manage warfarin.  Date INR Plan  11/18 1.7 HELD  11/19 N/A 6 mg   Patient is currently on Aggrastat gtt which is to run until 11/20 at 0400. Plan is to re-start warfarin 11/19 evening.  Goal of Therapy:  INR 2.5 - 3.5 Monitor platelets by anticoagulation protocol: Yes   Plan:  --Re-start warfarin at 6 mg tonight (home dose) --Daily INR per protocol --After completion of Aggrastat, may need to consider Lovenox until patient returns to therapeutic range  Tressie Ellis 11/22/2022,11:34 AM

## 2022-11-22 NOTE — Progress Notes (Signed)
Called by nursing and informed patient bleeding from right groin insertion site of the prior sheath placement. Upon arrival I examined the patient. Right groin was oozing from the sheath insertion site and from around the central line that remains.   I held strong pressure manually directly to the insertion site and the central line for 10 minutes. Nursing took over and applied 10 minutes of manual pressure to the site and central line. Oozing appeared to slow quite considerably. A separate dressing was applied to the central line. Then 4X4's were placed over both the central line and sheath opening. Then a PAD device was placed over the guaze and inflated to 20 PSI of pressure. The PAD was secured with a foam tape dressing to ensure it would stay in place. I waited 10 minutes after this dressing was in place. No noted bleeding. Patient remains on Aggrastat infusion overnight. Aggrastat to stop at 4 AM.

## 2022-11-22 NOTE — Plan of Care (Signed)

## 2022-11-22 NOTE — Op Note (Signed)
[facility]  Percutaneous Study/Intervention Procedural Note   Date of Surgery: 11/22/2022  Surgeon(s):Devarius Nelles    Assistants:none  Pre-operative Diagnosis: PAD with rest pain left lower extremity, status post overnight thrombolytic therapy  Post-operative diagnosis:  Same  Procedure(s) Performed:             1.  Left lower extremity angiogram             2.  Percutaneous transluminal angioplasty of left popliteal artery with 4 mm diameter by 10 cm length Lutonix drug-coated angioplasty balloon             3.  Percutaneous transluminal angioplasty of left posterior tibial artery and tibioperoneal trunk with 3 mm diameter by 15 cm length angioplasty balloon             4.  Percutaneous transluminal angioplasty of left external iliac artery with 6 mm diameter and 7 mm diameter by 4 cm length Lutonix drug-coated angioplasty balloon             5.  StarClose closure device right femoral artery  EBL: 25 cc  Contrast: 45 cc  Fluoro Time: 4.7 minutes  Moderate Conscious Sedation Time: approximately 54 minutes using 3 mg of Versed and 75 mcg of Fentanyl              Indications:  Patient is a 62 y.o.female with acute on chronic rest pain symptoms over the past several weeks with a known history of severe peripheral arterial disease.  She was brought in yesterday and due to extensive thrombosis required overnight thrombolytic therapy. The patient is brought in for angiography for further evaluation and potential treatment.  Due to the limb threatening nature of the situation, angiogram was performed for attempted limb salvage. The patient is aware that if the procedure fails, amputation would be expected.  The patient also understands that even with successful revascularization, amputation may still be required due to the severity of the situation.  Risks and benefits are discussed and informed consent is obtained.   Procedure:  The patient was identified and  appropriate procedural time out was performed.  The patient was then placed supine on the table and prepped and draped in the usual sterile fashion. Moderate conscious sedation was administered during a face to face encounter with the patient throughout the procedure with my supervision of the RN administering medicines and monitoring the patient's vital signs, pulse oximetry, telemetry and mental status throughout from the start of the procedure until the patient was taken to the recovery room.  The existing thrombolytic catheter was removed over a V18 wire that was parked in the posterior tibial artery.  Selective left lower extremity angiogram was then performed. This demonstrated clearance of the vast majority of the thrombus throughout the left lower extremity.  There remained the known stenosis in the distal left external iliac artery.  The left SFA and popliteal stents were now patent, although there was significant narrowing of greater than 60% in the popliteal artery just below the stent.  There was also thrombus and stenosis at the origin of the anterior tibial artery with small to medium amounts of thrombus in the tibioperoneal trunk and posterior tibial artery associated with native disease creating greater than 60% stenosis.  The posterior tibial artery was the dominant runoff distally and continuous to the foot.  The peroneal artery also provided additional runoff although it was small.  The anterior tibial artery disease proximally was present, but I  fear that treating this would lead to sacrifice of the tibioperoneal trunk and posterior tibial artery which were better runoff distally.  It was felt that it was in the patient's best interest to proceed with intervention after these images to avoid a second procedure and a larger amount of contrast and fluoroscopy based off of the findings from the initial angiogram. The patient was systemically heparinized and I started with a 4 mm diameter by 10 cm  length Lutonix drug-coated angioplasty balloon in the popliteal artery at the bottom and just below the previously placed stents down to the most distal popliteal artery.  This was inflated to 8 atm for 1 minute.  I then used a 3 mm diameter by 15 cm length angioplasty balloon inflating this to 10 atm for 1 minute in the posterior tibial artery and tibioperoneal trunk.  Following this, there was marked improvement with what appeared to be less than 25% residual stenosis in both vessels.  I then placed a 0.035 wire and pulled back to the left external iliac artery to address this lesion.  A 6 mm diameter by 4 cm length Lutonix drug-coated angioplasty balloon was inflated to 8 atm but this was slightly undersized and so I upsized to a 7 mm diameter by 4 cm length Lutonix drug-coated angioplasty balloon for the left external iliac artery and inflated this to 8 atm for 1 minute.  Completion imaging showed significant improvement with about a 25 to 30% residual stenosis which was not flow-limiting.  I elected to terminate the procedure. The sheath was removed and StarClose closure device was deployed in the right femoral artery with excellent hemostatic result. The patient was taken to the recovery room in stable condition having tolerated the procedure well.  Findings:                            Left lower Extremity:  There remained the known stenosis in the distal left external iliac artery.  The left SFA and popliteal stents were now patent, although there was significant narrowing of greater than 60% in the popliteal artery just below the stent.  There was also thrombus and stenosis at the origin of the anterior tibial artery with small to medium amounts of thrombus in the tibioperoneal trunk and posterior tibial artery associated with native disease creating greater than 60% stenosis.  The posterior tibial artery was the dominant runoff distally and continuous to the foot.  The peroneal artery also provided  additional runoff although it was small.  The anterior tibial artery disease proximally was present, but I fear that treating this would lead to sacrifice of the tibioperoneal trunk and posterior tibial artery which were better runoff distally.   Disposition: Patient was taken to the recovery room in stable condition having tolerated the procedure well.  Complications: None  Festus Barren 11/22/2022 9:30 AM   This note was created with Dragon Medical transcription system. Any errors in dictation are purely unintentional.

## 2022-11-22 NOTE — Interval H&P Note (Signed)
History and Physical Interval Note:  11/22/2022 8:05 AM  Theresa Phelps  has presented today for surgery, with the diagnosis of PAD.  The various methods of treatment have been discussed with the patient and family. After consideration of risks, benefits and other options for treatment, the patient has consented to  Procedure(s): Lower Extremity Angiography (Left) as a surgical intervention.  The patient's history has been reviewed, patient examined, no change in status, stable for surgery.  I have reviewed the patient's chart and labs.  Questions were answered to the patient's satisfaction.     Festus Barren

## 2022-11-22 NOTE — Progress Notes (Signed)
Pt picked up by vascular nurse for procedure, son at bedside and updated, pt alert and oriented x4, noted bigeminy on the monitor, nurse notified, MD will be notified by nurse. No signs of bleeding noted.

## 2022-11-23 LAB — PROTIME-INR
INR: 2.7 — ABNORMAL HIGH (ref 0.8–1.2)
INR: 2.8 — ABNORMAL HIGH (ref 0.8–1.2)
Prothrombin Time: 28.5 s — ABNORMAL HIGH (ref 11.4–15.2)
Prothrombin Time: 29.5 s — ABNORMAL HIGH (ref 11.4–15.2)

## 2022-11-23 LAB — CBC
HCT: 34.7 % — ABNORMAL LOW (ref 36.0–46.0)
Hemoglobin: 11.7 g/dL — ABNORMAL LOW (ref 12.0–15.0)
MCH: 29.4 pg (ref 26.0–34.0)
MCHC: 33.7 g/dL (ref 30.0–36.0)
MCV: 87.2 fL (ref 80.0–100.0)
Platelets: 161 10*3/uL (ref 150–400)
RBC: 3.98 MIL/uL (ref 3.87–5.11)
RDW: 13.3 % (ref 11.5–15.5)
WBC: 21 10*3/uL — ABNORMAL HIGH (ref 4.0–10.5)
nRBC: 0 % (ref 0.0–0.2)

## 2022-11-23 MED ORDER — WARFARIN SODIUM 4 MG PO TABS
4.0000 mg | ORAL_TABLET | Freq: Once | ORAL | Status: AC
  Administered 2022-11-23: 4 mg via ORAL
  Filled 2022-11-23: qty 1

## 2022-11-23 NOTE — TOC Initial Note (Signed)
Transition of Care [facility]) - Initial/Assessment Note    Patient Details  Name: Theresa Phelps MRN: [id number] Date of Birth: 09/12/1960  [facility]) CM/SW Contact:    Chapman Fitch, RN Phone Number: 11/23/2022, 12:35 PM  Clinical Narrative:                  TOC following for disposition.  PT eval ordered for 11/21       Patient Goals and CMS Choice            Expected Discharge Plan and Services                                              Prior Living Arrangements/Services                       Activities of Daily Living   ADL Screening (condition at time of admission) Independently performs ADLs?: Yes (appropriate for developmental age) Is the patient deaf or have difficulty hearing?: No Does the patient have difficulty seeing, even when wearing glasses/contacts?: No Does the patient have difficulty concentrating, remembering, or making decisions?: No  Permission Sought/Granted                  Emotional Assessment              Admission diagnosis:  Ischemic leg [I99.8] Patient Active Problem List   Diagnosis Date Noted   Ischemic leg 11/21/2022   Neck pain 10/15/2021   Cervical radiculopathy 07/09/2021   Cervical spondylosis 07/09/2021   Cervical spine pain 07/09/2021   Strain of neck muscle 07/09/2021   Bradycardia 12/31/2020   Perianal abscess 09/04/2020   Carotid artery stenosis, asymptomatic, right 05/27/2019   Pulmonary hypertension (HCC) 05/27/2019   Primary osteoarthritis of both knees 12/10/2018   Centrilobular emphysema (HCC) 11/28/2018   High risk medication use 11/23/2018   Rheumatoid arthritis, seropositive (HCC) 11/23/2018   Red blood cell antibody positive 01/19/2018   S/P insertion of iliac artery stent 01/16/2018   S/P mitral valve replacement with metallic valve 01/05/2018   Warfarin anticoagulation 12/18/2017   AV block, 1st degree 12/17/2017   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 [place] [address] Phone: [phone] Fax: [phone]     Social Determinants of Health (SDOH) Social History: SDOH Screenings   Food Insecurity: Food Insecurity Present (11/21/2022)  Housing: Low Risk  (11/21/2022)  Transportation Needs: No Transportation Needs (11/21/2022)  Utilities: Not At Risk (11/21/2022)  Financial Resource Strain: High Risk (08/08/2022)   Received from [facility]  Physical Activity: Insufficiently Active (08/08/2022)   Received from [facility]  Social Connections: Socially Isolated (08/08/2022)   Received from [facility]  Stress: Stress Concern Present (08/08/2022)   [facility]  Tobacco Use: Medium Risk (11/21/2022)  Health Literacy: Adequate Health Literacy (08/08/2022)   Received from [facility]   SDOH Interventions:     Readmission Risk Interventions     No data to display

## 2022-11-23 NOTE — Progress Notes (Signed)
Progress Note    11/23/2022 2:08 PM 1 Day Post-Op  Subjective:  Theresa Phelps is a 62 yo female who is now POD #1 from Aortogram with selective left lower extremity angiogram.  Angiogram including mechanical thrombectomy of the left SFA, popliteal artery and tibioperoneal trunk as well as angioplasty of the left posterior tibial artery left popliteal artery and thrombotic tPA to the left SFA, popliteal artery and tibioperoneal trunk.  On exam this morning patient is resting comfortably in bed.  She had some oozing from her right groin incision site and triple-lumen catheter yesterday afternoon.  I reapplied dressings with a PAD device which controlled the bleeding.  Nursing reports at 4 AM the dressing was changed with a very scant amount of oozing noted.  Patient's Aggrastat infusion has completed at the 18-hour mark and patient was given 6 mg of oral Coumadin yesterday to get back to her therapeutic range of 2.5-3.5 INR.  This morning's INR at 3:30 AM was 2.7 and it was repeated at 9:20 AM and it was 2.8.  Patient has no complaints overnight.  She endorses bilateral lower extremities feel much better today.  Vitals overnight all remained stable.   Vitals:   11/23/22 1130 11/23/22 1230  BP: 105/62 (!) 136/52  Pulse: (!) 51 (!) 29  Resp: (!) 21 12  Temp:    SpO2: 90% 97%   Physical Exam: Cardiac:  RRR, Normal S1, S2. No murmurs Lungs:  Clear on auscultation throughout. No rales, rhonchi or wheezing to note.  Incisions:  Right groin with pressure dressing. Scant oozing to note. Triple lumen catheter remains in place. No hematoma or seroma to note.  Extremities:  Bilateral ower extremities are warm to touch. Palpable DP pulses.  Abdomen: Positive bowel sounds throughout, soft, nontender, nondistended morbid obesity. Neurologic: Alert and oriented to person place and time.  Patient answers questions and follows commands appropriately.  CBC    Component Value Date/Time   WBC 21.0 (H)  11/23/2022 0336   RBC 3.98 11/23/2022 0336   HGB 11.7 (L) 11/23/2022 0336   HGB 14.9 01/20/2013 2320   HCT 34.7 (L) 11/23/2022 0336   HCT 45.1 01/20/2013 2320   PLT 161 11/23/2022 0336   PLT 248 01/20/2013 2320   MCV 87.2 11/23/2022 0336   MCV 88 01/20/2013 2320   MCH 29.4 11/23/2022 0336   MCHC 33.7 11/23/2022 0336   RDW 13.3 11/23/2022 0336   RDW 13.5 01/20/2013 2320   LYMPHSABS 2.0 07/20/2022 1436   LYMPHSABS 2.2 01/20/2013 2320   MONOABS 0.5 07/20/2022 1436   MONOABS 0.2 01/20/2013 2320   EOSABS 0.3 07/20/2022 1436   EOSABS 0.1 01/20/2013 2320   BASOSABS 0.0 07/20/2022 1436   BASOSABS 0.1 01/20/2013 2320    BMET    Component Value Date/Time   NA 133 (L) 11/22/2022 2218   NA 138 01/20/2013 2320   K 4.0 11/22/2022 2218   K 4.1 01/20/2013 2320   CL 106 11/22/2022 2218   CL 108 (H) 01/20/2013 2320   CO2 22 11/22/2022 2218   CO2 20 (L) 01/20/2013 2320   GLUCOSE 226 (H) 11/22/2022 2218   GLUCOSE 165 (H) 01/20/2013 2320   BUN 11 11/22/2022 2218   BUN 17 01/20/2013 2320   CREATININE 0.83 11/22/2022 2218   CREATININE 1.14 01/20/2013 2320   CALCIUM 8.0 (L) 11/22/2022 2218   CALCIUM 9.2 01/20/2013 2320   GFRNONAA >60 11/22/2022 2218   GFRNONAA 55 (L) 01/20/2013 2320   GFRAA >60 07/15/2015 1247  GFRAA >60 01/20/2013 2320    INR    Component Value Date/Time   INR 2.8 (H) 11/23/2022 0920     Intake/Output Summary (Last 24 hours) at 11/23/2022 1408 Last data filed at 11/23/2022 1100 Gross per 24 hour  Intake 728.04 ml  Output 2050 ml  Net -1321.96 ml     Assessment/Plan:  63 y.o. female is s/p aortogram with selective left lower extremity angiogram and mechanical thrombectomy with angioplasty.  1 Day Post-Op   PLAN: Continue pressure dressing to her right groin. Continue daily labs, CBC and INR. Continue oral Coumadin as dosed by pharmacy, for an INR of 2.8 today. Advance diet as tolerated. Pain medications as needed. Transfer to the floor. OOB to the  chair with assist. Physical therapy evaluation.  DVT prophylaxis: Coumadin, dosed by pharmacy due to INR levels.   Marcie Bal [facility] 11/23/2022 2:08 PM

## 2022-11-23 NOTE — Consult Note (Addendum)
PHARMACY - ANTICOAGULATION CONSULT NOTE  Pharmacy Consult for Warfarin Indication:  Mechanical mitral valve  Patient Measurements: Height: 5\' 10"  (177.8 cm) Weight: 101.8 kg (224 lb 6.9 oz) IBW/kg (Calculated) : 68.5 Heparin Dosing Weight: 90.5 kg  Labs: Recent Labs    11/21/22 0838 11/21/22 1320 11/21/22 1657 11/21/22 2248 11/22/22 0454 11/22/22 2218 11/23/22 0336 11/23/22 0920  HGB  --    < > 13.2 12.9 12.3  --  11.7*  --   HCT  --    < > 41.0 39.8 37.8  --  34.7*  --   PLT  --    < > 172 173 164  --  161  --   LABPROT  --   --  20.1*  --   --   --  28.5* 29.5*  INR  --   --  1.7*  --   --   --  2.7* 2.8*  HEPARINUNFRC  --    < > 0.14* <0.10* 0.10*  --   --   --   CREATININE 0.96  --   --   --   --  0.83  --   --    < > = values in this interval not displayed.    Estimated Creatinine Clearance: 89.6 mL/min (by C-G formula based on SCr of 0.83 mg/dL).  Medical History: Past Medical History:  Diagnosis Date   Acid reflux    Arthritis    rheumatoid arthritis   Asthma    Carpal tunnel syndrome    LEFT   CHF (congestive heart failure) (HCC)    Coronary artery disease    High cholesterol    Myocardial infarction [facility])    Peripheral vascular disease (HCC)    Pre-diabetes    Pulmonary hypertension (HCC)    Severe mitral regurgitation    Medications:  Warfarin 6 mg daily PTA per cardiology office 10/14/22  Assessment: 62 y/o F with medical history as above and including mechanical mitral valve on warfarin and PAD admitted for limb ischemia now s/p endovascular intervention. Pharmacy consulted to manage warfarin.  Date INR Plan  11/18 1.7 HELD  11/19 N/A 6 mg  11/20 2.7, 2.8 4 mg   Goal of Therapy:  INR 2.5 - 3.5 Monitor platelets by anticoagulation protocol: Yes   Plan:  --INR with relatively large increase (see above) despite only one dose of warfarin 6 mg (home dose). In review of patient's records she appears to have history of labile INRs with  supratherapeutic readings. She has been having some oozing from surgical site --Discussed with vascular NP, will give decreased dose of warfarin 4 mg tonight. She will need close outpatient f/u with her anticoagulation clinic --Daily INR per protocol  Tressie Ellis 11/23/2022,1:47 PM

## 2022-11-24 LAB — CBC
HCT: 33.4 % — ABNORMAL LOW (ref 36.0–46.0)
Hemoglobin: 10.8 g/dL — ABNORMAL LOW (ref 12.0–15.0)
MCH: 28.9 pg (ref 26.0–34.0)
MCHC: 32.3 g/dL (ref 30.0–36.0)
MCV: 89.3 fL (ref 80.0–100.0)
Platelets: 130 10*3/uL — ABNORMAL LOW (ref 150–400)
RBC: 3.74 MIL/uL — ABNORMAL LOW (ref 3.87–5.11)
RDW: 13.3 % (ref 11.5–15.5)
WBC: 11.6 10*3/uL — ABNORMAL HIGH (ref 4.0–10.5)
nRBC: 0 % (ref 0.0–0.2)

## 2022-11-24 LAB — PROTIME-INR
INR: 2.3 — ABNORMAL HIGH (ref 0.8–1.2)
Prothrombin Time: 25.5 s — ABNORMAL HIGH (ref 11.4–15.2)

## 2022-11-24 LAB — URINALYSIS, COMPLETE (UACMP) WITH MICROSCOPIC
Bilirubin Urine: NEGATIVE
Glucose, UA: 150 mg/dL — AB
Ketones, ur: NEGATIVE mg/dL
Leukocytes,Ua: NEGATIVE
Nitrite: NEGATIVE
Protein, ur: NEGATIVE mg/dL
Specific Gravity, Urine: 1.009 (ref 1.005–1.030)
pH: 7 (ref 5.0–8.0)

## 2022-11-24 LAB — BASIC METABOLIC PANEL
Anion gap: 7 (ref 5–15)
BUN: 14 mg/dL (ref 8–23)
CO2: 26 mmol/L (ref 22–32)
Calcium: 8.3 mg/dL — ABNORMAL LOW (ref 8.9–10.3)
Chloride: 104 mmol/L (ref 98–111)
Creatinine, Ser: 0.97 mg/dL (ref 0.44–1.00)
GFR, Estimated: >60 mL/min (ref >60)
Glucose, Bld: 123 mg/dL — ABNORMAL HIGH (ref 70–99)
Potassium: 3.4 mmol/L — ABNORMAL LOW (ref 3.5–5.1)
Sodium: 137 mmol/L (ref 135–145)

## 2022-11-24 SURGERY — TEMPORARY DIALYSIS CATHETER
Anesthesia: LOCAL

## 2022-11-24 MED ORDER — WARFARIN SODIUM 2 MG PO TABS
2.0000 mg | ORAL_TABLET | Freq: Once | ORAL | Status: AC
  Administered 2022-11-24: 2 mg via ORAL
  Filled 2022-11-24: qty 1

## 2022-11-24 MED ORDER — HYDROCODONE-ACETAMINOPHEN 5-325 MG PO TABS: 1.0000 | ORAL_TABLET | Freq: Four times a day (QID) | ORAL | 0 refills | Status: DC | PRN

## 2022-11-24 MED ORDER — WARFARIN SODIUM 6 MG PO TABS
6.0000 mg | ORAL_TABLET | Freq: Every day | ORAL | Status: DC
  Filled 2022-11-24: qty 1

## 2022-11-24 NOTE — Discharge Summary (Addendum)
[facility]    Discharge Summary    Patient ID:  Theresa Phelps MRN: [id number] DOB/AGE: 1960/06/20 62 y.o.  Admit date: 11/21/2022 Discharge date: 11/24/2022 Date of Surgery: 11/22/2022 Surgeon: Surgeon(s): Dew, Marlow Baars, MD  Admission Diagnosis: Ischemic leg [I99.8]  Discharge Diagnoses:  Ischemic leg [I99.8]  Secondary Diagnoses: Past Medical History:  Diagnosis Date   Acid reflux    Arthritis    rheumatoid arthritis   Asthma    Carpal tunnel syndrome    LEFT   CHF (congestive heart failure) (HCC)    Coronary artery disease    High cholesterol    Myocardial infarction (HCC)    Peripheral vascular disease (HCC)    Pre-diabetes    Pulmonary hypertension (HCC)    Severe mitral regurgitation     Procedure(s): Lower Extremity Angiography  Discharged Condition: good  HPI:  Theresa Phelps is a 62 yo female now POD#2 from Left Lower extremity Angiogram with lysis catheter, mechanical thrombectomy, and Angioplasty.  Patient is recovering as expected.  Right groin incision sites clean dry and intact with dressing in place.  Patient is eating, ambulating and urinating well.  Patient placed back on her oral anticoagulation with Coumadin 6 mg daily.  INR is adequate today on discharge.  Patient has been instructed to follow-up with cardiology clinic for postop check and INR check tomorrow on 11/25/2022.  Patient was also instructed to follow-up with PCP Dr. Kirke Corin tomorrow 11/25/2022 for medicine reconciliation postop.  Patient to be discharged later today.  Vitals all remained stable.  Patient will be discharged on aspirin 81 mg daily, Coumadin 6 mg daily, and atorvastatin 80 mg daily. Patient will need continued INR checks.  Follow-up visit set up for tomorrow 11/25/2022 with cardiology.  Hospital Course:  Theresa Phelps is a 62 y.o. female is S/P Left Lower extremity Angiogram with lysis catheter, mechanical thrombectomy, and Angioplasty.    Extubated: POD # 0 Physical Exam:  Alert notes x3, no acute distress Face: Symmetrical.  Tongue is midline. Neck: Trachea is midline.  No swelling or bruising. Cardiovascular: Regular rate and rhythm Pulmonary: Clear to auscultation bilaterally Abdomen: Soft, nontender, nondistended Right groin access: Clean dry and intact.  No swelling or drainage noted Left lower extremity: Thigh soft.  Calf soft.  Extremities warm distally toes. Hard to palpate pedal pulses however the foot is warm is her good capillary refill. Right lower extremity: Thigh soft.  Calf soft.  Extremities warm distally toes. Hard to palpate pedal pulses however the foot is warm is her good capillary refill. Neurological: No deficits noted   Post-op wounds:  clean, dry, intact or healing well  Pt. Ambulating, voiding and taking PO diet without difficulty. Pt pain controlled with PO pain meds.  Labs:  As below  Complications: none  Consults:    Significant Diagnostic Studies: CBC Lab Results  Component Value Date   WBC 11.6 (H) 11/24/2022   HGB 10.8 (L) 11/24/2022   HCT 33.4 (L) 11/24/2022   MCV 89.3 11/24/2022   PLT 130 (L) 11/24/2022    BMET    Component Value Date/Time   NA 137 11/24/2022 0523   NA 138 01/20/2013 2320   K 3.4 (L) 11/24/2022 0523   K 4.1 01/20/2013 2320   CL 104 11/24/2022 0523   CL 108 (H) 01/20/2013 2320   CO2 26 11/24/2022 0523   CO2 20 (L) 01/20/2013 2320   GLUCOSE 123 (H) 11/24/2022 0523   GLUCOSE 165 (H) 01/20/2013  2320   BUN 14 11/24/2022 0523   BUN 17 01/20/2013 2320   CREATININE 0.97 11/24/2022 0523   CREATININE 1.14 01/20/2013 2320   CALCIUM 8.3 (L) 11/24/2022 0523   CALCIUM 9.2 01/20/2013 2320   GFRNONAA >60 11/24/2022 0523   GFRNONAA 55 (L) 01/20/2013 2320   GFRAA >60 07/15/2015 1247   GFRAA >60 01/20/2013 2320   COAG Lab Results  Component Value Date   INR 2.3 (H) 11/24/2022   INR 2.8 (H) 11/23/2022   INR 2.7 (H) 11/23/2022     Disposition:   Discharge to :Home  Allergies as of 11/24/2022       Reactions   Shellfish Allergy Anaphylaxis   Banana    Other reaction(s): Other (See Comments) Burning in the mouth   Garlic    Other reaction(s): Other (See Comments) Pt states arms freeze up and can't talk but no SOB   Isosorbide Nitrate Itching   Blurred vision   Fish-derived Products Rash        Medication List     TAKE these medications    acetaminophen 500 MG tablet Commonly known as: TYLENOL Take 2 tablets (1,000 mg total) by mouth every 6 (six) hours as needed.   amLODipine 5 MG tablet Commonly known as: NORVASC Take 5 mg by mouth daily.   aspirin EC 81 MG tablet Take 1 tablet (81 mg total) by mouth daily. Swallow whole.   atorvastatin 80 MG tablet Commonly known as: LIPITOR Take 80 mg by mouth daily.   benzonatate 200 MG capsule Commonly known as: TESSALON Take 1 capsule (200 mg total) by mouth 2 (two) times daily as needed for cough.   clopidogrel 75 MG tablet Commonly known as: PLAVIX Take 75 mg by mouth once.   EPINEPHrine 0.3 mg/0.3 mL Soaj injection Commonly known as: EPI-PEN Inject into the muscle as directed.   ezetimibe 10 MG tablet Commonly known as: ZETIA Take 10 mg by mouth daily.   fluticasone 50 MCG/ACT nasal spray Commonly known as: FLONASE Place 2 sprays into both nostrils daily.   folic acid 1 MG tablet Commonly known as: FOLVITE Take by mouth.   furosemide 40 MG tablet Commonly known as: LASIX   gabapentin 100 MG capsule Commonly known as: NEURONTIN Take 100 mg by mouth 2 (two) times daily.   HYDROcodone-acetaminophen 5-325 MG tablet Commonly known as: NORCO/VICODIN Take 1-2 tablets by mouth every 6 (six) hours as needed for moderate pain (pain score 4-6).   loratadine 10 MG tablet Commonly known as: CLARITIN Take 10 mg by mouth daily.   nitroGLYCERIN 0.4 MG SL tablet Commonly known as: NITROSTAT Place 0.4 mg under the tongue every 5 (five) minutes as needed  for chest pain.   nitroGLYCERIN 0.4 MG SL tablet Commonly known as: NITROSTAT Place under the tongue.   warfarin 6 MG tablet Commonly known as: COUMADIN Take 6 mg by mouth at bedtime.       Verbal and written Discharge instructions given to the patient. Wound care per Discharge AVS  Follow-up Information     Gerlene Fee, PA-C Follow up in 2 day(s).   Specialty: Cardiology Why: Needs INR Checked Contact information: [address] [place] [address] [phone]         Georgiana Spinner, NP Follow up in 2 week(s).   Specialty: Vascular Surgery Why: Left Lower U/S duplex scan with ABI's Contact information: [address] [place] [address] [phone]  Dione Housekeeper, MD Follow up in 1 day(s).   Specialty: Family Medicine Why: Post Op check with Medication reconciliation Contact information: [address] [place] [address] [phone]                 Signed: Marcie Bal, NP  11/24/2022, 9:14 AM

## 2022-11-24 NOTE — Evaluation (Signed)
Physical Therapy Evaluation Patient Details Name: Theresa Phelps MRN: [id number] DOB: 09-03-1960 Today's Date: 11/24/2022  History of Present Illness  Patient is a 62 year old female s/p aortogram with LLE angiogram and mechanical thrombectomy with angioplasty.  Clinical Impression  Patient is agreeable to PT evaluation. She is independent at baseline and lives alone. She will have help from family as needed at discharge.  Patient was able to stand and ambulate in hallway today using rolling walker. She ambulated a short distance without the walker with mild decreased stance time on the RLE. Recommend to use rolling walker for longer distance ambulation initially. No PT needs anticipated after this hospital stay. PT will continue to follow while in the hospital to maximize independence and decrease caregiver burden.       If plan is discharge home, recommend the following: Assist for transportation   Can travel by private vehicle        Equipment Recommendations Rolling walker (2 wheels)  Recommendations for Other Services       Functional Status Assessment Patient has had a recent decline in their functional status and demonstrates the ability to make significant improvements in function in a reasonable and predictable amount of time.     Precautions / Restrictions Precautions Precautions:  (low fall risk) Restrictions Weight Bearing Restrictions: No      Mobility  Bed Mobility Overal bed mobility: Needs Assistance Bed Mobility: Supine to Sit, Sit to Supine     Supine to sit: Contact guard Sit to supine: Modified independent (Device/Increase time)        Transfers Overall transfer level: Needs assistance Equipment used: Rolling walker (2 wheels) Transfers: Sit to/from Stand Sit to Stand: Supervision                Ambulation/Gait Ambulation/Gait assistance: Modified independent (Device/Increase time) Gait Distance (Feet): 60 Feet Assistive device:  Rolling walker (2 wheels) Gait Pattern/deviations: Step-to pattern, Step-through pattern, Decreased stance time - right Gait velocity: decreased     General Gait Details: patient using rolling walker for support with longer distance ambulation due to groin pain. no loss of balance with short distance ambulation without rolling walker, however gait decreased stance time on RLE. recomend to use rolling walker for ambulation for safety  Stairs            Wheelchair Mobility     Tilt Bed    Modified Rankin (Stroke Patients Only)       Balance Overall balance assessment: Mild deficits observed, not formally tested                                           Pertinent Vitals/Pain Pain Assessment Pain Assessment: Faces Faces Pain Scale: Hurts a little bit Pain Location: R groin Pain Descriptors / Indicators: Burning Pain Intervention(s): Limited activity within patient's tolerance    Home Living Family/patient expects to be discharged to:: Private residence Living Arrangements: Alone Available Help at Discharge: Family Type of Home: House Home Access: Ramped entrance                Prior Function Prior Level of Function : Independent/Modified Independent             Mobility Comments: independent without device. no DME used. does not drive       Extremity/Trunk Assessment   Upper Extremity Assessment Upper Extremity Assessment: Overall [facility]  for tasks assessed    Lower Extremity Assessment Lower Extremity Assessment:  (mild R groin pain with movement, otherwise grossly WFL For tasks assessed)       Communication   Communication Communication: No apparent difficulties  Cognition Arousal: Alert Behavior During Therapy: WFL for tasks assessed/performed Overall Cognitive Status: Within Functional Limits for tasks assessed                                          General Comments General comments (skin integrity,  edema, etc.): mild dizziness with mobility that did not impact functional independence or worsen with activity    Exercises     Assessment/Plan    PT Assessment Patient needs continued PT services  PT Problem List Decreased activity tolerance;Decreased balance;Decreased mobility;Pain       PT Treatment Interventions DME instruction;Gait training;Stair training;Functional mobility training;Therapeutic activities;Therapeutic exercise;Balance training;Neuromuscular re-education;Cognitive remediation;Patient/family education    PT Goals (Current goals can be found in the Care Plan section)  Acute Rehab PT Goals Patient Stated Goal: to go home today PT Goal Formulation: With patient Time For Goal Achievement: 12/08/22 Potential to Achieve Goals: Good    Frequency Min 1X/week     Co-evaluation               AM-PAC PT "6 Clicks" Mobility  Outcome Measure Help needed turning from your back to your side while in a flat bed without using bedrails?: None Help needed moving from lying on your back to sitting on the side of a flat bed without using bedrails?: None Help needed moving to and from a bed to a chair (including a wheelchair)?: A Little Help needed standing up from a chair using your arms (e.g., wheelchair or bedside chair)?: A Little Help needed to walk in hospital room?: A Little Help needed climbing 3-5 steps with a railing? : A Little 6 Click Score: 20    End of Session Equipment Utilized During Treatment: Gait belt Activity Tolerance: Patient tolerated treatment well Patient left: in bed;with call bell/phone within reach;with bed alarm set Nurse Communication: Mobility status PT Visit Diagnosis: Other abnormalities of gait and mobility (R26.89);Difficulty in walking, not elsewhere classified (R26.2)    Time: 9629-5284 PT Time Calculation (min) (ACUTE ONLY): 18 min   Charges:   PT Evaluation $PT Eval Moderate Complexity: 1 Mod PT Treatments $Therapeutic  Activity: 8-22 mins PT General Charges $$ ACUTE PT VISIT: 1 Visit        Donna Bernard, PT, MPT   Ina Homes 11/24/2022, 9:43 AM

## 2022-11-24 NOTE — Consult Note (Signed)
PHARMACY - ANTICOAGULATION CONSULT NOTE  Pharmacy Consult for Warfarin Indication:  Mechanical mitral valve  Patient Measurements: Height: 5\' 10"  (177.8 cm) Weight: 101.8 kg (224 lb 6.9 oz) IBW/kg (Calculated) : 68.5 Heparin Dosing Weight: 90.5 kg  Labs: Recent Labs    11/21/22 0838 11/21/22 1320 11/21/22 1657 11/21/22 2248 11/22/22 0454 11/22/22 2218 11/23/22 0336 11/23/22 0920 11/24/22 0523  HGB  --    < > 13.2 12.9 12.3  --  11.7*  --  10.8*  HCT  --    < > 41.0 39.8 37.8  --  34.7*  --  33.4*  PLT  --    < > 172 173 164  --  161  --  130*  LABPROT  --    < > 20.1*  --   --   --  28.5* 29.5* 25.5*  INR  --    < > 1.7*  --   --   --  2.7* 2.8* 2.3*  HEPARINUNFRC  --    < > 0.14* <0.10* 0.10*  --   --   --   --   CREATININE 0.96  --   --   --   --  0.83  --   --  0.97   < > = values in this interval not displayed.    Estimated Creatinine Clearance: 76.7 mL/min (by C-G formula based on SCr of 0.97 mg/dL).  Medical History: Past Medical History:  Diagnosis Date   Acid reflux    Arthritis    rheumatoid arthritis   Asthma    Carpal tunnel syndrome    LEFT   CHF (congestive heart failure) (HCC)    Coronary artery disease    High cholesterol    Myocardial infarction [facility])    Peripheral vascular disease (HCC)    Pre-diabetes    Pulmonary hypertension (HCC)    Severe mitral regurgitation    Medications:  Warfarin 6 mg daily PTA per cardiology office 10/14/22  Assessment: 62 y/o F with medical history as above and including mechanical mitral valve on warfarin and PAD admitted for limb ischemia now s/p endovascular intervention. Pharmacy consulted to manage warfarin.  Date INR Plan  11/18 1.7 HELD  11/19 N/A 6 mg  11/20 2.7, 2.8 4 mg  11/21 2.3 2 mg, 6 mg   Goal of Therapy:  INR 2.5 - 3.5 Monitor platelets by anticoagulation protocol: Yes   Plan:  --INR is slightly subtherapeutic today. Per discussion with vascular NP, will give warfarin 2 mg in AM. Resume  home dose of warfarin 6 mg in evening --Daily INR per protocol  Tressie Ellis 11/24/2022,8:37 AM

## 2022-11-24 NOTE — TOC Transition Note (Addendum)
Transition of Care [facility]) - CM/SW Discharge Note   Patient Details  Name: Theresa Phelps MRN: [id number] Date of Birth: 02-02-1960  Transition of [facility]) CM/SW Contact:  Chapman Fitch, RN Phone Number: 11/24/2022, 9:38 AM   Clinical Narrative:     Patient to discharge today Per PT recommendations for RW and no Pt follow up Patient agreeable to Rw.  Referral made to Jon with adapt to be delivered to room prior to discharge  Patient states that daughter to transport at discharge    Update: notified that patient's ride will  not be available until later in the day.  Patient agreeable to transport by cab but states she does not have the means to pay.  Cab voucher completed and printed to unit.  Bedside RN to have patient sign rider waiver       Patient Goals and CMS Choice      Discharge Placement                         Discharge Plan and Services Additional resources added to the After Visit Summary for                                       Social Determinants of Health (SDOH) Interventions SDOH Screenings   Food Insecurity: Food Insecurity Present (11/21/2022)  Housing: Low Risk  (11/21/2022)  Transportation Needs: No Transportation Needs (11/21/2022)  Utilities: Not At Risk (11/21/2022)  Financial Resource Strain: High Risk (08/08/2022)   Received from [facility]  Physical Activity: Insufficiently Active (08/08/2022)   Received from [facility]  Social Connections: Socially Isolated (08/08/2022)   Received from [facility]  Stress: Stress Concern Present (08/08/2022)   Received from [facility]  Tobacco Use: Medium Risk (11/21/2022)  Health Literacy: Adequate Health Literacy (08/08/2022)   Received from [facility]     Readmission Risk Interventions     No data to display

## 2022-11-24 NOTE — Progress Notes (Signed)
Walker delivered to patient's room.   Discharge paperwork reviewed with patient, discussed groin site care post discharge.   Patient voiced understanding in her own words and all questions answered.

## 2022-11-24 NOTE — Progress Notes (Signed)
Right femoral central line removed at 0748, pressure held for 10 minutes and pressure dressing applied to site.   Patient tolerated procedure well.     Right femoral site level one from previous existing ecchymosis, site soft with no hematoma.   Patient educated on post central line removal risk of bleeding, precautions, and plan of care.  Patient demonstrated and verbalized understanding in their own words

## 2022-11-24 NOTE — Plan of Care (Signed)
  Problem: Education: Goal: Knowledge of General Education information will improve Description Including pain rating scale, medication(s)/side effects and non-pharmacologic comfort measures Outcome: Progressing   Problem: Health Behavior/Discharge Planning: Goal: Ability to manage health-related needs will improve Outcome: Progressing   Problem: Clinical Measurements: Goal: Ability to maintain clinical measurements within normal limits will improve Outcome: Progressing Goal: Diagnostic test results will improve Outcome: Progressing Goal: Respiratory complications will improve Outcome: Progressing Goal: Cardiovascular complication will be avoided Outcome: Progressing   Problem: Activity: Goal: Risk for activity intolerance will decrease Outcome: Progressing   Problem: Nutrition: Goal: Adequate nutrition will be maintained Outcome: Progressing   Problem: Coping: Goal: Level of anxiety will decrease Outcome: Progressing

## 2022-11-24 NOTE — Discharge Instructions (Signed)
Do not lift anything heavy.  Do not lift anything more than a gallon of milk for the next 2 weeks.  You may shower tomorrow on Friday, 11/25/2022.  Please shower with the old dressing to your right groin in place and after shower immediately remove pat dry and place a Band-Aid over the incision site for the next 3 days.  Follow-up appointments have been placed with cardiology for tomorrow 11/25/2022.  You need to attend this visit as a follow-up check with cardiology due to your mechanical heart valve and the need for anticoagulation. Follow-up appointment with your PCP Dr. Waneta Martins for medicine reconciliation has been requested for tomorrow as well 11/25/2022.  Please follow-up accordingly. Follow-up appointment with vein and vascular surgery as scheduled.  You are being discharged home on Coumadin 6 mg daily.  This is your anticoagulation medicine for your mechanical heart valve.  Please do not skip taking any of this medicine as this may result in serious medical conditions such as heart attack stroke or death.  Patient advised not to drive until seen in follow-up with vein and vascular surgery.  Patient endorses she does not drive she has to have her daughter take her to to her doctors appointments.

## 2022-12-02 ENCOUNTER — Encounter (INDEPENDENT_AMBULATORY_CARE_PROVIDER_SITE_OTHER): Payer: Medicaid Other

## 2022-12-02 ENCOUNTER — Ambulatory Visit (INDEPENDENT_AMBULATORY_CARE_PROVIDER_SITE_OTHER): Payer: Medicaid Other | Admitting: Nurse Practitioner

## 2022-12-12 ENCOUNTER — Other Ambulatory Visit (INDEPENDENT_AMBULATORY_CARE_PROVIDER_SITE_OTHER): Payer: Self-pay | Admitting: Vascular Surgery

## 2022-12-12 DIAGNOSIS — Z9889 Other specified postprocedural states: Secondary | ICD-10-CM

## 2022-12-13 ENCOUNTER — Encounter (INDEPENDENT_AMBULATORY_CARE_PROVIDER_SITE_OTHER): Payer: Medicaid Other

## 2022-12-13 ENCOUNTER — Ambulatory Visit (INDEPENDENT_AMBULATORY_CARE_PROVIDER_SITE_OTHER): Payer: Medicaid Other

## 2022-12-13 ENCOUNTER — Ambulatory Visit (INDEPENDENT_AMBULATORY_CARE_PROVIDER_SITE_OTHER): Payer: Medicaid Other | Admitting: Nurse Practitioner

## 2022-12-13 ENCOUNTER — Encounter (INDEPENDENT_AMBULATORY_CARE_PROVIDER_SITE_OTHER): Payer: Self-pay | Admitting: Nurse Practitioner

## 2022-12-13 VITALS — BP 120/70 | HR 55 | Resp 16 | Wt 225.2 lb

## 2022-12-13 DIAGNOSIS — I6523 Occlusion and stenosis of bilateral carotid arteries: Secondary | ICD-10-CM | POA: Diagnosis not present

## 2022-12-13 DIAGNOSIS — I739 Peripheral vascular disease, unspecified: Secondary | ICD-10-CM | POA: Diagnosis not present

## 2022-12-13 DIAGNOSIS — E785 Hyperlipidemia, unspecified: Secondary | ICD-10-CM

## 2022-12-13 DIAGNOSIS — Z9889 Other specified postprocedural states: Secondary | ICD-10-CM | POA: Diagnosis not present

## 2022-12-13 MED ORDER — GABAPENTIN 100 MG PO CAPS: 100.0000 mg | ORAL_CAPSULE | Freq: Two times a day (BID) | ORAL | 6 refills | Status: DC

## 2022-12-18 ENCOUNTER — Encounter (INDEPENDENT_AMBULATORY_CARE_PROVIDER_SITE_OTHER): Payer: Self-pay | Admitting: Nurse Practitioner

## 2022-12-18 NOTE — Progress Notes (Signed)
Subjective:    Patient ID: Theresa Phelps, female    DOB: 11/13/1960, 62 y.o.   MRN: [id number] Chief Complaint  Patient presents with   Follow-up    [facility] 2 week with ABI    The patient returns to the office for followup and review status post angiogram with intervention on 11/22/2022.   Procedure: Procedure(s) Performed:             1.  Left lower extremity angiogram             2.  Percutaneous transluminal angioplasty of left popliteal artery with 4 mm diameter by 10 cm length Lutonix drug-coated angioplasty balloon             3.  Percutaneous transluminal angioplasty of left posterior tibial artery and tibioperoneal trunk with 3 mm diameter by 15 cm length angioplasty balloon             4.  Percutaneous transluminal angioplasty of left external iliac artery with 6 mm diameter and 7 mm diameter by 4 cm length Lutonix drug-coated angioplasty balloon             5.  StarClose closure device right femoral artery   The patient notes improvement in the lower extremity symptoms. No interval shortening of the patient's claudication distance or rest pain symptoms. No new ulcers or wounds have occurred since the last visit.  There have been no significant changes to the patient's overall health care.  No documented history of amaurosis fugax or recent TIA symptoms. There are no recent neurological changes noted. No documented history of DVT, PE or superficial thrombophlebitis. The patient denies recent episodes of angina or shortness of breath.   ABI's Rt=1.01 and Lt=1.02  (previous ABI's Rt=0.84 and Lt=0.87) Duplex US of the right lower extremity shows monophasic waveforms with biphasic/triphasic on the left and normal toe waveforms bilaterally.    Review of Systems  All other systems reviewed and are negative.      Objective:   Physical Exam Vitals reviewed.  HENT:     Head: Normocephalic.  Cardiovascular:     Rate and Rhythm: Normal rate.     Pulses:          Dorsalis  pedis pulses are detected w/ Doppler on the right side and detected w/ Doppler on the left side.       Posterior tibial pulses are detected w/ Doppler on the right side and detected w/ Doppler on the left side.  Pulmonary:     Effort: Pulmonary effort is normal.  Skin:    General: Skin is warm and dry.  Neurological:     Mental Status: She is alert and oriented to person, place, and time.  Psychiatric:        Mood and Affect: Mood normal.        Behavior: Behavior normal.        Thought Content: Thought content normal.        Judgment: Judgment normal.     BP 120/70 (BP Location: Right Arm)   Pulse (!) 55   Resp 16   Wt 225 lb 3.2 oz (102.2 kg)   BMI 32.31 kg/m   Past Medical History:  Diagnosis Date   Acid reflux    Arthritis    rheumatoid arthritis   Asthma    Carpal tunnel syndrome    LEFT   CHF (congestive heart failure) (HCC)    Coronary artery disease    High cholesterol  Myocardial infarction (HCC)    Peripheral vascular disease (HCC)    Pre-diabetes    Pulmonary hypertension (HCC)    Severe mitral regurgitation     Social History   Socioeconomic History   Marital status: Single    Spouse name: Not on file   Number of children: Not on file   Years of education: Not on file   Highest education level: Not on file  Occupational History   Not on file  Tobacco Use   Smoking status: Former    Current packs/day: 0.00    Average packs/day: 0.5 packs/day for 20.0 years (10.0 ttl pk-yrs)    Types: Cigarettes    Start date: 12/1998    Quit date: 12/2018    Years since quitting: 4.0   Smokeless tobacco: Never  Vaping Use   Vaping status: Never Used  Substance and Sexual Activity   Alcohol use: Yes    Alcohol/week: 3.0 standard drinks of alcohol    Types: 3 Cans of beer per week    Comment: twice week. NONE THIS WEEK   Drug use: No   Sexual activity: Yes    Birth control/protection: Post-menopausal  Other Topics Concern   Not on file  Social History  Narrative   Not on file   Social Drivers of Health   Financial Resource Strain: High Risk (08/08/2022)   Received from [facility]   Overall Financial Resource Strain (CARDIA)    Difficulty of Paying Living Expenses: Hard  Food Insecurity: Food Insecurity Present (11/21/2022)   Hunger Vital Sign    Worried About Running Out of Food in the Last Year: Sometimes true    Ran Out of Food in the Last Year: Never true  Transportation Needs: No Transportation Needs (11/21/2022)   PRAPARE - Administrator, Civil Service (Medical): No    Lack of Transportation (Non-Medical): No  Physical Activity: Insufficiently Active (08/08/2022)   Received from [facility]   Exercise Vital Sign    Days of Exercise per Week: 5 days    Minutes of Exercise per Session: 20 min  Stress: Stress Concern Present (08/08/2022)   Received from [facility] - Occupational Stress Questionnaire    Feeling of Stress : To some extent  Social Connections: Socially Isolated (08/08/2022)   Received from [facility]   Social Connection and Isolation Panel [NHANES]    Frequency of Communication with Friends and Family: More than three times a week    Frequency of Social Gatherings with Friends and Family: More than three times a week    Attends Religious Services: Never    Database administrator or Organizations: No    Attends Banker Meetings: Never    Marital Status: Never married  Intimate Partner Violence: Not At Risk (11/21/2022)   Humiliation, Afraid, Rape, and Kick questionnaire    Fear of Current or Ex-Partner: No    Emotionally Abused: No    Physically Abused: No    Sexually Abused: No    Past Surgical History:  Procedure Laterality Date   BREAST BIOPSY Left 01/04/2012   CARDIAC VALVE REPLACEMENT     COLONOSCOPY WITH PROPOFOL N/A 04/30/2021   Procedure: COLONOSCOPY WITH  PROPOFOL;  Surgeon: Regis Bill, MD;  Location: [facility] ENDOSCOPY;  Service: Endoscopy;  Laterality: N/A;   COLONOSCOPY WITH PROPOFOL N/A 08/13/2021   Procedure: COLONOSCOPY WITH PROPOFOL;  Surgeon: Eather Colas  T, MD;  Location: [facility] ENDOSCOPY;  Service: Endoscopy;  Laterality: N/A;   CORONARY ARTERY BYPASS GRAFT  12/13/2017   replacement mitral valve by Dr. Silvestre Mesi   INCISION AND DRAINAGE ABSCESS Right 09/04/2020   Procedure: INCISION AND DRAINAGE ABSCESS-Perianal;  Surgeon: Carolan Shiver, MD;  Location: [facility] ORS;  Service: General;  Laterality: Right;   LOWER EXTREMITY ANGIOGRAPHY Left 05/28/2015   Procedure: Lower Extremity Angiography;  Surgeon: Annice Needy, MD;  Location: [facility] INVASIVE CV LAB;  Service: Cardiovascular;  Laterality: Left;   LOWER EXTREMITY ANGIOGRAPHY Left 12/28/2021   Procedure: Lower Extremity Angiography;  Surgeon: Annice Needy, MD;  Location: [facility] CV [facility];  Service: Cardiovascular;  Laterality: Left;   LOWER EXTREMITY ANGIOGRAPHY Left 11/21/2022   Procedure: Lower Extremity Angiography;  Surgeon: Annice Needy, MD;  Location: [facility];  Service: Cardiovascular;  Laterality: Left;   LOWER EXTREMITY ANGIOGRAPHY Left 11/22/2022   Procedure: Lower Extremity Angiography;  Surgeon: Annice Needy, MD;  Location: [facility];  Service: Cardiovascular;  Laterality: Left;   PERIPHERAL VASCULAR CATHETERIZATION Right 07/16/2015   Procedure: Lower Extremity Angiography;  Surgeon: Annice Needy, MD;  Location: [facility] CV LAB;  Service: Cardiovascular;  Laterality: Right;   PERIPHERAL VASCULAR CATHETERIZATION  07/16/2015   Procedure: Lower Extremity Intervention;  Surgeon: Annice Needy, MD;  Location: [facility] CV [facility];  Service: Cardiovascular;;   RIGHT/LEFT HEART CATH AND CORONARY ANGIOGRAPHY Bilateral 11/07/2017   Procedure: RIGHT/LEFT HEART CATH AND CORONARY ANGIOGRAPHY;  Surgeon: Lamar Blinks, MD;  Location: [facility] INVASIVE CV  LAB;  Service: Cardiovascular;  Laterality: Bilateral;   TEE WITHOUT CARDIOVERSION N/A 11/07/2017   Procedure: TRANSESOPHAGEAL ECHOCARDIOGRAM (TEE);  Surgeon: Lamar Blinks, MD;  Location: [facility] ORS;  Service: Cardiovascular;  Laterality: N/A;   WISDOM TOOTH EXTRACTION      Family History  Problem Relation Age of Onset   Hypertension Mother    AAA (abdominal aortic aneurysm) Father    Other Father        bowel obstruction    Allergies  Allergen Reactions   Shellfish Allergy Anaphylaxis   Banana     Other reaction(s): Other (See Comments) Burning in the mouth   Garlic     Other reaction(s): Other (See Comments) Pt states arms freeze up and can't talk but no SOB   Isosorbide Nitrate Itching    Blurred vision   Fish-Derived Products Rash       Latest Ref Rng & Units 11/24/2022    5:23 AM 11/23/2022    3:36 AM 11/22/2022    4:54 AM  CBC  WBC 4.0 - 10.5 K/uL 11.6  21.0  17.5   Hemoglobin 12.0 - 15.0 g/dL 16.1  09.6  04.5   Hematocrit 36.0 - 46.0 % 33.4  34.7  37.8   Platelets 150 - 400 K/uL 130  161  164       CMP     Component Value Date/Time   NA 137 11/24/2022 0523   NA 138 01/20/2013 2320   K 3.4 (L) 11/24/2022 0523   K 4.1 01/20/2013 2320   CL 104 11/24/2022 0523   CL 108 (H) 01/20/2013 2320   CO2 26 11/24/2022 0523   CO2 20 (L) 01/20/2013 2320   GLUCOSE 123 (H) 11/24/2022 0523   GLUCOSE 165 (H) 01/20/2013 2320   BUN 14 11/24/2022 0523   BUN 17 01/20/2013 2320   CREATININE 0.97 11/24/2022 0523   CREATININE 1.14 01/20/2013 2320  CALCIUM 8.3 (L) 11/24/2022 0523   CALCIUM 9.2 01/20/2013 2320   PROT 6.8 09/03/2020 1400   PROT 8.5 (H) 01/20/2013 2320   ALBUMIN 2.9 (L) 09/03/2020 1400   ALBUMIN 3.7 01/20/2013 2320   AST 22 09/03/2020 1400   AST 14 (L) 01/20/2013 2320   ALT 15 09/03/2020 1400   ALT 22 01/20/2013 2320   ALKPHOS 75 09/03/2020 1400   ALKPHOS 105 01/20/2013 2320   BILITOT 1.1 09/03/2020 1400   BILITOT 0.4 01/20/2013 2320   GFRNONAA  >60 11/24/2022 0523   GFRNONAA 55 (L) 01/20/2013 2320     VAS [place] ABI WITH/WO TBI Result Date: 11/17/2022  LOWER EXTREMITY DOPPLER STUDY Patient Name:  Theresa Phelps  Date of Exam:   11/16/2022 Medical Rec #: [id number]         Accession #:    [id number] Date of Birth: 04-18-1960         Patient Gender: F Patient Age:   5 years Exam Location:  [facility] Procedure:      VAS [place] ABI WITH/WO TBI Referring Phys: Barbara Cower DEW --------------------------------------------------------------------------------  Indications: Claudication, and peripheral artery disease. Constant left foot              pain since stent placement High Risk Factors: Hyperlipidemia, past history of smoking, prior MI, coronary                    artery disease.  Vascular Interventions: 05/28/15: Left SFA/popliteal PTA/stent;                         07/16/15: Right SFA PTA/stent with right popliteal PTA;                         12/28/21: Left SFA/popliteal thrombectomy/stent x2;. Comparison Study: Prior duplex 06/10/2022 showed elevated velocities in the                   proximal right SFA. Performing Technologist: Hardie Lora RVT  Examination Guidelines: A complete evaluation includes at minimum, Doppler waveform signals and systolic blood pressure reading at the level of bilateral brachial, anterior tibial, and posterior tibial arteries, when vessel segments are accessible. Bilateral testing is considered an integral part of a complete examination. Photoelectric Plethysmograph (PPG) waveforms and toe systolic pressure readings are included as required and additional duplex testing as needed. Limited examinations for reoccurring indications may be performed as noted.  ABI Findings: +---------+------------------+-----+----------+--------+ Right    Rt Pressure (mmHg)IndexWaveform  Comment  +---------+------------------+-----+----------+--------+ Brachial 135                                        +---------+------------------+-----+----------+--------+ PTA      117               0.84 monophasic         +---------+------------------+-----+----------+--------+ DP       109               0.78 biphasic           +---------+------------------+-----+----------+--------+ Great Toe93                0.67                    +---------+------------------+-----+----------+--------+ +---------+------------------+-----+----------+-------+ Left     Lt Pressure (mmHg)IndexWaveform  Comment +---------+------------------+-----+----------+-------+ Brachial 139                                      +---------+------------------+-----+----------+-------+ PTA      116               0.83 monophasic        +---------+------------------+-----+----------+-------+ DP       121               0.87 monophasic        +---------+------------------+-----+----------+-------+ Great Toe82                0.59                   +---------+------------------+-----+----------+-------+ +-------+-----------+-----------+------------+------------+ ABI/TBIToday's ABIToday's TBIPrevious ABIPrevious TBI +-------+-----------+-----------+------------+------------+ Right  0.84       0.67       0.73        0.90         +-------+-----------+-----------+------------+------------+ Left   0.87       0.59       0.94        0.75         +-------+-----------+-----------+------------+------------+  Right ABIs appear increased compared to prior study on 06/10/2022. Left ABIs appear essentially unchanged compared to prior study on 06/10/2022.  Summary: Right: Resting right ankle-brachial index indicates mild right lower extremity arterial disease. The right toe-brachial index is abnormal. Left: Resting left ankle-brachial index indicates mild left lower extremity arterial disease. The left toe-brachial index is abnormal. *See table(s) above for measurements and observations.  Electronically signed by  Levora Dredge MD on 11/17/2022 at 3:34:57 PM.    Final        Assessment & Plan:   1. Peripheral arterial disease with history of revascularization (HCC) (Primary)  Recommend:  The patient is status post successful angiogram with intervention.  The patient reports that the claudication symptoms and leg pain has improved.   The patient denies lifestyle limiting changes at this point in time.  No further invasive studies, angiography or surgery at this time. The patient should continue walking and begin a more formal exercise program.  The patient should continue antiplatelet therapy and aggressive treatment of the lipid abnormalities  Continued surveillance is indicated as atherosclerosis is likely to progress with time.    Patient should undergo noninvasive studies as ordered. The patient will follow up with me to review the studies.   2. Bilateral carotid artery stenosis Carotid was last evaluated a year ago.  We will recheck at follow-up.  3. Hyperlipidemia, unspecified hyperlipidemia type Continue statin as ordered and reviewed, no changes at this time   Current Outpatient Medications on File Prior to Visit  Medication Sig Dispense Refill   acetaminophen (TYLENOL) 500 MG tablet Take 2 tablets (1,000 mg total) by mouth every 6 (six) hours as needed. 30 tablet 0   amLODipine (NORVASC) 5 MG tablet Take 5 mg by mouth daily.     aspirin EC 81 MG tablet Take 1 tablet (81 mg total) by mouth daily. Swallow whole. 150 tablet 2   atorvastatin (LIPITOR) 80 MG tablet Take 80 mg by mouth daily.     benzonatate (TESSALON) 200 MG capsule Take 1 capsule (200 mg total) by mouth 2 (two) times daily as needed for cough. 30 capsule 0   clopidogrel (PLAVIX) 75 MG tablet Take 75 mg by mouth once.  EPINEPHrine 0.3 mg/0.3 mL IJ SOAJ injection Inject into the muscle as directed.     ezetimibe (ZETIA) 10 MG tablet Take 10 mg by mouth daily.     fluticasone (FLONASE) 50 MCG/ACT nasal spray Place 2  sprays into both nostrils daily.     folic acid (FOLVITE) 1 MG tablet Take by mouth.     HYDROcodone-acetaminophen (NORCO/VICODIN) 5-325 MG tablet Take 1-2 tablets by mouth every 6 (six) hours as needed for moderate pain (pain score 4-6). 30 tablet 0   loratadine (CLARITIN) 10 MG tablet Take 10 mg by mouth daily.     nitroGLYCERIN (NITROSTAT) 0.4 MG SL tablet Place under the tongue.     nitroGLYCERIN (NITROSTAT) 0.4 MG SL tablet Place 0.4 mg under the tongue every 5 (five) minutes as needed for chest pain.     warfarin (COUMADIN) 6 MG tablet Take 6 mg by mouth at bedtime.     furosemide (LASIX) 40 MG tablet  (Patient not taking: Reported on 11/21/2022)     [DISCONTINUED] cetirizine (ZYRTEC) 10 MG tablet Take 10 mg by mouth daily. Reported on 05/28/2015     No current facility-administered medications on file prior to visit.    There are no Patient Instructions on file for this visit. No follow-ups on file.   Georgiana Spinner, NP

## 2022-12-19 LAB — VAS US ABI WITH/WO TBI
Left ABI: 1.02
Right ABI: 1.01

## 2022-12-30 ENCOUNTER — Telehealth (INDEPENDENT_AMBULATORY_CARE_PROVIDER_SITE_OTHER): Payer: Self-pay

## 2022-12-30 NOTE — Telephone Encounter (Signed)
Patient left a message stating that she was having pain and tingling go up in the vein of her arm. I reach out to the patient to get more information but was not able to leave a voicemail.

## 2023-01-18 ENCOUNTER — Telehealth (INDEPENDENT_AMBULATORY_CARE_PROVIDER_SITE_OTHER): Payer: Self-pay

## 2023-01-18 NOTE — Telephone Encounter (Signed)
 Pain in left toes where she had surgery. Also has a pain in her left arm, it's a stinging sensation. It makes her feel like she will drop something. The pain goes from her wrist up to her shoulder. She has been having these pains since she had surgery.  Lower extremity angio 11/21/22 and Dialysis catheter was canceled on 11/24/22  Please advise

## 2023-01-18 NOTE — Telephone Encounter (Signed)
 She will need a Left upper extremity Arterial duplex and ABI.  We didn't do any intervention to her left arm, so it may not be vascular in nature.  In addition the pain her toes may be neuropathy from multiple episodes of not having sufficient blood flow.  She can see me or dew

## 2023-01-23 ENCOUNTER — Other Ambulatory Visit (INDEPENDENT_AMBULATORY_CARE_PROVIDER_SITE_OTHER): Payer: Self-pay | Admitting: Nurse Practitioner

## 2023-01-23 DIAGNOSIS — M79602 Pain in left arm: Secondary | ICD-10-CM

## 2023-01-23 DIAGNOSIS — I739 Peripheral vascular disease, unspecified: Secondary | ICD-10-CM

## 2023-01-25 ENCOUNTER — Ambulatory Visit (INDEPENDENT_AMBULATORY_CARE_PROVIDER_SITE_OTHER): Payer: Medicaid Other

## 2023-01-25 ENCOUNTER — Ambulatory Visit (INDEPENDENT_AMBULATORY_CARE_PROVIDER_SITE_OTHER): Payer: Medicaid Other | Admitting: Nurse Practitioner

## 2023-01-25 ENCOUNTER — Encounter (INDEPENDENT_AMBULATORY_CARE_PROVIDER_SITE_OTHER): Payer: Self-pay | Admitting: Nurse Practitioner

## 2023-01-25 VITALS — BP 115/73 | HR 60 | Resp 18 | Ht 70.0 in | Wt 229.0 lb

## 2023-01-25 DIAGNOSIS — M79641 Pain in right hand: Secondary | ICD-10-CM

## 2023-01-25 DIAGNOSIS — Z9889 Other specified postprocedural states: Secondary | ICD-10-CM | POA: Diagnosis not present

## 2023-01-25 DIAGNOSIS — M79602 Pain in left arm: Secondary | ICD-10-CM

## 2023-01-25 DIAGNOSIS — I739 Peripheral vascular disease, unspecified: Secondary | ICD-10-CM

## 2023-01-25 DIAGNOSIS — M79642 Pain in left hand: Secondary | ICD-10-CM

## 2023-01-25 DIAGNOSIS — M79675 Pain in left toe(s): Secondary | ICD-10-CM

## 2023-01-25 MED ORDER — TRAMADOL HCL 50 MG PO TABS: 50.0000 mg | ORAL_TABLET | Freq: Four times a day (QID) | ORAL | 0 refills | Status: DC | PRN

## 2023-01-27 LAB — VAS US ABI WITH/WO TBI
Left ABI: 0.97
Right ABI: 0.98

## 2023-02-03 NOTE — Progress Notes (Signed)
Subjective:    Patient ID: Theresa Phelps, female    DOB: 27-Jan-1960, 63 y.o.   MRN: [id number] Chief Complaint  Patient presents with   Follow-up    fu Left upper extremity Arterial duplex and ABI per FB    The patient presents today for evaluation due to numbness in her left arm that has been coming and going.  She notes that the shocking sensation that goes up her arm.  She also is concerned about her toe on her left foot.  She notes that the third toe joint is stuck and she is unable to move it.  She also this is very painful for her.  She still feels that she is wearing socks at times on her feet that she has had multiple ischemic episodes in that foot.  Today the patient has biphasic waveforms throughout the left upper extremity.  She has normal digit waveforms in the left upper extremity with no evidence of arterial obstruction or stenosis noted.  Today the patient's ABI is 0.98 on the right and 0.97 on the left.  She has triphasic/biphasic tibial vessel waveforms with stronger toe waveforms on the left slightly dampened on the right.  She recently underwent extensive intervention of the left lower extremity.    Review of Systems  Musculoskeletal:  Positive for arthralgias and joint swelling.  Neurological:  Positive for numbness.  All other systems reviewed and are negative.      Objective:   Physical Exam Vitals reviewed.  HENT:     Head: Normocephalic.  Cardiovascular:     Rate and Rhythm: Normal rate.     Pulses:          Radial pulses are 2+ on the right side and 2+ on the left side.  Pulmonary:     Effort: Pulmonary effort is normal.  Musculoskeletal:        General: Deformity present.  Skin:    General: Skin is warm and dry.  Neurological:     Mental Status: She is alert and oriented to person, place, and time.  Psychiatric:        Mood and Affect: Mood normal.        Behavior: Behavior normal.        Thought Content: Thought content normal.         Judgment: Judgment normal.     BP 115/73   Pulse 60   Resp 18   Ht 5\' 10"  (1.778 m)   Wt 229 lb (103.9 kg)   BMI 32.86 kg/m   Past Medical History:  Diagnosis Date   Acid reflux    Arthritis    rheumatoid arthritis   Asthma    Carpal tunnel syndrome    LEFT   CHF (congestive heart failure) (HCC)    Coronary artery disease    High cholesterol    Myocardial infarction (HCC)    Peripheral vascular disease (HCC)    Pre-diabetes    Pulmonary hypertension (HCC)    Severe mitral regurgitation     Social History   Socioeconomic History   Marital status: Single    Spouse name: Not on file   Number of children: Not on file   Years of education: Not on file   Highest education level: Not on file  Occupational History   Not on file  Tobacco Use   Smoking status: Former    Current packs/day: 0.00    Average packs/day: 0.5 packs/day for 20.0 years (10.0 ttl pk-yrs)  Types: Cigarettes    Start date: 12/1998    Quit date: 12/2018    Years since quitting: 4.1   Smokeless tobacco: Never  Vaping Use   Vaping status: Never Used  Substance and Sexual Activity   Alcohol use: Yes    Alcohol/week: 3.0 standard drinks of alcohol    Types: 3 Cans of beer per week    Comment: twice week. NONE THIS WEEK   Drug use: No   Sexual activity: Yes    Birth control/protection: Post-menopausal  Other Topics Concern   Not on file  Social History Narrative   Not on file   Social Drivers of Health   Financial Resource Strain: High Risk (08/08/2022)   Received from [facility]   Overall Financial Resource Strain (CARDIA)    Difficulty of Paying Living Expenses: Hard  Food Insecurity: Food Insecurity Present (11/21/2022)   Hunger Vital Sign    Worried About Running Out of Food in the Last Year: Sometimes true    Ran Out of Food in the Last Year: Never true  Transportation Needs: No Transportation Needs (11/21/2022)   PRAPARE - Scientist, research (physical sciences) (Medical): No    Lack of Transportation (Non-Medical): No  Physical Activity: Insufficiently Active (08/08/2022)   Received from [facility]   Exercise Vital Sign    Days of Exercise per Week: 5 days    Minutes of Exercise per Session: 20 min  Stress: Stress Concern Present (08/08/2022)   Received from [facility] of Occupational Health - Occupational Stress Questionnaire    Feeling of Stress : To some extent  Social Connections: Socially Isolated (08/08/2022)   Received from [facility]   Social Connection and Isolation Panel [NHANES]    Frequency of Communication with Friends and Family: More than three times a week    Frequency of Social Gatherings with Friends and Family: More than three times a week    Attends Religious Services: Never    Database administrator or Organizations: No    Attends Banker Meetings: Never    Marital Status: Never married  Intimate Partner Violence: Not At Risk (11/21/2022)   Humiliation, Afraid, Rape, and Kick questionnaire    Fear of Current or Ex-Partner: No    Emotionally Abused: No    Physically Abused: No    Sexually Abused: No    Past Surgical History:  Procedure Laterality Date   BREAST BIOPSY Left 01/04/2012   CARDIAC VALVE REPLACEMENT     COLONOSCOPY WITH PROPOFOL N/A 04/30/2021   Procedure: COLONOSCOPY WITH PROPOFOL;  Surgeon: Regis Bill, MD;  Location: [facility] ENDOSCOPY;  Service: Endoscopy;  Laterality: N/A;   COLONOSCOPY WITH PROPOFOL N/A 08/13/2021   Procedure: COLONOSCOPY WITH PROPOFOL;  Surgeon: Regis Bill, MD;  Location: [facility] ENDOSCOPY;  Service: Endoscopy;  Laterality: N/A;   CORONARY ARTERY BYPASS GRAFT  12/13/2017   replacement mitral valve by Dr. Silvestre Mesi   INCISION AND DRAINAGE ABSCESS Right 09/04/2020   Procedure: INCISION AND DRAINAGE ABSCESS-Perianal;  Surgeon: Carolan Shiver, MD;  Location: [facility] ORS;   Service: General;  Laterality: Right;   LOWER EXTREMITY ANGIOGRAPHY Left 05/28/2015   Procedure: Lower Extremity Angiography;  Surgeon: Annice Needy, MD;  Location: [facility] INVASIVE CV LAB;  Service: Cardiovascular;  Laterality: Left;   LOWER EXTREMITY ANGIOGRAPHY Left 12/28/2021   Procedure: Lower Extremity Angiography;  Surgeon: Annice Needy, MD;  Location: [facility]  INVASIVE CV LAB;  Service: Cardiovascular;  Laterality: Left;   LOWER EXTREMITY ANGIOGRAPHY Left 11/21/2022   Procedure: Lower Extremity Angiography;  Surgeon: Annice Needy, MD;  Location: [facility] INVASIVE CV LAB;  Service: Cardiovascular;  Laterality: Left;   LOWER EXTREMITY ANGIOGRAPHY Left 11/22/2022   Procedure: Lower Extremity Angiography;  Surgeon: Annice Needy, MD;  Location: [facility] INVASIVE CV LAB;  Service: Cardiovascular;  Laterality: Left;   PERIPHERAL VASCULAR CATHETERIZATION Right 07/16/2015   Procedure: Lower Extremity Angiography;  Surgeon: Annice Needy, MD;  Location: [facility] INVASIVE CV LAB;  Service: Cardiovascular;  Laterality: Right;   PERIPHERAL VASCULAR CATHETERIZATION  07/16/2015   Procedure: Lower Extremity Intervention;  Surgeon: Annice Needy, MD;  Location: [facility] INVASIVE CV LAB;  Service: Cardiovascular;;   RIGHT/LEFT HEART CATH AND CORONARY ANGIOGRAPHY Bilateral 11/07/2017   Procedure: RIGHT/LEFT HEART CATH AND CORONARY ANGIOGRAPHY;  Surgeon: Lamar Blinks, MD;  Location: [facility] INVASIVE CV LAB;  Service: Cardiovascular;  Laterality: Bilateral;   TEE WITHOUT CARDIOVERSION N/A 11/07/2017   Procedure: TRANSESOPHAGEAL ECHOCARDIOGRAM (TEE);  Surgeon: Lamar Blinks, MD;  Location: [facility] ORS;  Service: Cardiovascular;  Laterality: N/A;   WISDOM TOOTH EXTRACTION      Family History  Problem Relation Age of Onset   Hypertension Mother    AAA (abdominal aortic aneurysm) Father    Other Father        bowel obstruction    Allergies  Allergen Reactions   Shellfish Allergy Anaphylaxis   Banana     Other reaction(s):  Other (See Comments) Burning in the mouth   Garlic     Other reaction(s): Other (See Comments) Pt states arms freeze up and can't talk but no SOB   Isosorbide Nitrate Itching    Blurred vision   Fish-Derived Products Rash       Latest Ref Rng & Units 11/24/2022    5:23 AM 11/23/2022    3:36 AM 11/22/2022    4:54 AM  CBC  WBC 4.0 - 10.5 K/uL 11.6  21.0  17.5   Hemoglobin 12.0 - 15.0 g/dL 56.2  13.0  86.5   Hematocrit 36.0 - 46.0 % 33.4  34.7  37.8   Platelets 150 - 400 K/uL 130  161  164       CMP     Component Value Date/Time   NA 137 11/24/2022 0523   NA 138 01/20/2013 2320   K 3.4 (L) 11/24/2022 0523   K 4.1 01/20/2013 2320   CL 104 11/24/2022 0523   CL 108 (H) 01/20/2013 2320   CO2 26 11/24/2022 0523   CO2 20 (L) 01/20/2013 2320   GLUCOSE 123 (H) 11/24/2022 0523   GLUCOSE 165 (H) 01/20/2013 2320   BUN 14 11/24/2022 0523   BUN 17 01/20/2013 2320   CREATININE 0.97 11/24/2022 0523   CREATININE 1.14 01/20/2013 2320   CALCIUM 8.3 (L) 11/24/2022 0523   CALCIUM 9.2 01/20/2013 2320   PROT 6.8 09/03/2020 1400   PROT 8.5 (H) 01/20/2013 2320   ALBUMIN 2.9 (L) 09/03/2020 1400   ALBUMIN 3.7 01/20/2013 2320   AST 22 09/03/2020 1400   AST 14 (L) 01/20/2013 2320   ALT 15 09/03/2020 1400   ALT 22 01/20/2013 2320   ALKPHOS 75 09/03/2020 1400   ALKPHOS 105 01/20/2013 2320   BILITOT 1.1 09/03/2020 1400   BILITOT 0.4 01/20/2013 2320   GFRNONAA >60 11/24/2022 0523   GFRNONAA 55 (L) 01/20/2013 2320     VAS [place] ABI WITH/WO TBI Result Date:  01/27/2023  LOWER EXTREMITY DOPPLER STUDY Patient Name:  Theresa Phelps  Date of Exam:   01/25/2023 Medical Rec #: [id number]         Accession #:    [id number] Date of Birth: 12-Sep-1960         Patient Gender: F Patient Age:   68 years Exam Location:  [facility] Procedure:      VAS [place] ABI WITH/WO TBI Referring Phys: Sheppard Plumber --------------------------------------------------------------------------------  Indications:  Claudication, and peripheral artery disease. Constant left foot              pain since stent placement High Risk Factors: Hyperlipidemia, past history of smoking, prior MI, coronary                    artery disease. Other Factors: 11/21/2022 Mechanical thrombectomy of the left SFA, popliteal                artery, and tibioperoneal trunk with the Rota Rex device                5. Percutaneous transluminal angioplasty of left posterior tibial                artery and tibioperoneal trunk with 3 mm diameter by 30 cm length                angioplasty balloon                6. Percutaneous transluminal angioplasty of left popliteal artery                with 4 mm diameter by 10 cm length Lutonix drug-coated                angioplasty balloon                7. Catheter directed thrombolytic therapy with 8 mg of tPA                instilled in the left SFA, popliteal artery, and tibioperoneal                trunk and placement of a continuous infusion catheter for                continued thrombolytic therapy with a 135 cm total length 50 cm                working length catheter and these vessels.  Vascular Interventions: 05/28/15: Left SFA/popliteal PTA/stent;                         07/16/15: Right SFA PTA/stent with right popliteal PTA;                         12/28/21: Left SFA/popliteal thrombectomy/stent x2;. Performing Technologist: Hardie Lora RVT  Examination Guidelines: A complete evaluation includes at minimum, Doppler waveform signals and systolic blood pressure reading at the level of bilateral brachial, anterior tibial, and posterior tibial arteries, when vessel segments are accessible. Bilateral testing is considered an integral part of a complete examination. Photoelectric Plethysmograph (PPG) waveforms and toe systolic pressure readings are included as required and additional duplex testing as needed. Limited examinations for reoccurring indications may be performed as noted.  ABI Findings:  +---------+------------------+-----+---------+--------+ Right    Rt Pressure (mmHg)IndexWaveform Comment  +---------+------------------+-----+---------+--------+ Brachial 145                                      +---------+------------------+-----+---------+--------+  PTA      142               0.98 triphasic         +---------+------------------+-----+---------+--------+ DP       97                0.67 biphasic          +---------+------------------+-----+---------+--------+ Great Toe74                0.51                   +---------+------------------+-----+---------+--------+ +---------+------------------+-----+---------+-------+ Left     Lt Pressure (mmHg)IndexWaveform Comment +---------+------------------+-----+---------+-------+ Brachial 138                                     +---------+------------------+-----+---------+-------+ PTA      140               0.97 triphasic        +---------+------------------+-----+---------+-------+ DP       129               0.89 biphasic         +---------+------------------+-----+---------+-------+ Great Toe93                0.64                  +---------+------------------+-----+---------+-------+ +-------+-----------+-----------+------------+------------+ ABI/TBIToday's ABIToday's TBIPrevious ABIPrevious TBI +-------+-----------+-----------+------------+------------+ Right  0.98       0.51       1.01        0.75         +-------+-----------+-----------+------------+------------+ Left   0.97       0.64       1.02        0.72         +-------+-----------+-----------+------------+------------+  Bilateral ABIs appear essentially unchanged compared to prior study on 12/13/2022.  Summary: Right: Resting right ankle-brachial index is within normal range. The right toe-brachial index is abnormal. Left: Resting left ankle-brachial index is within normal range. The left toe-brachial index is abnormal.  *See table(s) above for measurements and observations.  Electronically signed by Festus Barren MD on 01/27/2023 at 8:30:54 AM.    Final    VAS [place] ABI WITH/WO TBI Result Date: 12/19/2022  LOWER EXTREMITY DOPPLER STUDY Patient Name:  Theresa Phelps  Date of Exam:   12/13/2022 Medical Rec #: [id number]         Accession #:    [id number] Date of Birth: January 19, 1960         Patient Gender: F Patient Age:   34 years Exam Location:  [facility] Procedure:      VAS [place] ABI WITH/WO TBI Referring Phys: --------------------------------------------------------------------------------  Indications: Claudication, and peripheral artery disease. Constant left foot              pain since stent placement High Risk Factors: Hyperlipidemia, prior MI, coronary artery disease. Other Factors: 11/21/2022 Mechanical thrombectomy of the left SFA, popliteal                artery, and tibioperoneal trunk with the Rota Rex device                5. Percutaneous transluminal angioplasty of left posterior tibial                artery and tibioperoneal trunk with  3 mm diameter by 30 cm length                angioplasty balloon                6. Percutaneous transluminal angioplasty of left popliteal artery                with 4 mm diameter by 10 cm length Lutonix drug-coated                angioplasty balloon                7. Catheter directed thrombolytic therapy with 8 mg of tPA                instilled in the left SFA, popliteal artery, and tibioperoneal                trunk and placement of a continuous infusion catheter for                continued thrombolytic therapy with a 135 cm total length 50 cm                working length catheter and these vessels.  Vascular Interventions: 05/28/15: Left SFA/popliteal PTA/stent;                         07/16/15: Right SFA PTA/stent with right popliteal PTA;                         12/28/21: Left SFA/popliteal thrombectomy/stent x2;. Comparison Study: 11/16/2022 Performing Technologist: Salvadore Farber RVT  Examination Guidelines: A complete evaluation includes at minimum, Doppler waveform signals and systolic blood pressure reading at the level of bilateral brachial, anterior tibial, and posterior tibial arteries, when vessel segments are accessible. Bilateral testing is considered an integral part of a complete examination. Photoelectric Plethysmograph (PPG) waveforms and toe systolic pressure readings are included as required and additional duplex testing as needed. Limited examinations for reoccurring indications may be performed as noted.  ABI Findings: +---------+------------------+-----+----------+--------+ Right    Rt Pressure (mmHg)IndexWaveform  Comment  +---------+------------------+-----+----------+--------+ Brachial 140                                       +---------+------------------+-----+----------+--------+ ATA      109               0.78 monophasic         +---------+------------------+-----+----------+--------+ PTA      142               1.01 monophasic         +---------+------------------+-----+----------+--------+ Great Toe105               0.75 Normal             +---------+------------------+-----+----------+--------+ +---------+------------------+-----+---------+-------+ Left     Lt Pressure (mmHg)IndexWaveform Comment +---------+------------------+-----+---------+-------+ Brachial 140                                     +---------+------------------+-----+---------+-------+ ATA      111               0.79 biphasic         +---------+------------------+-----+---------+-------+ PTA      143  1.02 triphasic        +---------+------------------+-----+---------+-------+ Great Toe101               0.72 Normal           +---------+------------------+-----+---------+-------+ +-------+-----------+-----------+------------+------------+ ABI/TBIToday's ABIToday's TBIPrevious ABIPrevious TBI  +-------+-----------+-----------+------------+------------+ Right  1.01       .75        .84         .67          +-------+-----------+-----------+------------+------------+ Left   1.02       .72        .87         .59          +-------+-----------+-----------+------------+------------+ Bilateral ABIs and TBIs appear increased compared to prior study on 11/16/2022.  Summary: Right: Resting right ankle-brachial index is within normal range. The right toe-brachial index is normal. Left: Resting left ankle-brachial index is within normal range. The left toe-brachial index is normal. *See table(s) above for measurements and observations.  Electronically signed by Festus Barren MD on 12/19/2022 at 7:24:38 AM.    Final        Assessment & Plan:   1. Bilateral hand pain (Primary) Today the patient's vascular studies show primarily biphasic waveforms throughout the left upper extremity with no evidence of arterial obstruction noted with good digit waveforms.  Based on the patient's description of pain I suspect this may be more related to carpal tunnel syndrome.  Will send the patient to orthopedic surgery for evaluation - Ambulatory referral to Orthopedic Surgery  2. Peripheral arterial disease with history of revascularization (HCC) Today the patient's noninvasive studies remain consistent with her most recent follow-up.  Based on the patient's description of symptoms and pain I still believe that she does have some neuropathic issues caused by her time where she was ischemic.  However she does have adequate perfusion at this time.  We will keep the patient on routine follow-up and have her follow-up in approximately 2 months  3. Pain of toe of left foot Today noninvasive studies show adequate perfusion as noted above but the joint and the toe that is painful for her seems to be frozen.  Will refer the patient to podiatry for further evaluation as this is more of a musculoskeletal issue not vascular  related   Current Outpatient Medications on File Prior to Visit  Medication Sig Dispense Refill   acetaminophen (TYLENOL) 500 MG tablet Take 2 tablets (1,000 mg total) by mouth every 6 (six) hours as needed. 30 tablet 0   amLODipine (NORVASC) 5 MG tablet Take 5 mg by mouth daily.     atorvastatin (LIPITOR) 80 MG tablet Take 80 mg by mouth daily.     benzonatate (TESSALON) 200 MG capsule Take 1 capsule (200 mg total) by mouth 2 (two) times daily as needed for cough. 30 capsule 0   cetirizine (ZYRTEC) 10 MG tablet Take 10 mg by mouth daily. Reported on 05/28/2015     clopidogrel (PLAVIX) 75 MG tablet Take 75 mg by mouth once.     EPINEPHrine 0.3 mg/0.3 mL IJ SOAJ injection Inject into the muscle as directed.     ezetimibe (ZETIA) 10 MG tablet Take 10 mg by mouth daily.     fluticasone (FLONASE) 50 MCG/ACT nasal spray Place 2 sprays into both nostrils daily.     folic acid (FOLVITE) 1 MG tablet Take by mouth.     furosemide (LASIX) 40 MG tablet      gabapentin (  NEURONTIN) 100 MG capsule Take 1 capsule (100 mg total) by mouth 2 (two) times daily. 60 capsule 6   loratadine (CLARITIN) 10 MG tablet Take 10 mg by mouth daily.     nitroGLYCERIN (NITROSTAT) 0.4 MG SL tablet Place under the tongue.     nitroGLYCERIN (NITROSTAT) 0.4 MG SL tablet Place 0.4 mg under the tongue every 5 (five) minutes as needed for chest pain.     warfarin (COUMADIN) 6 MG tablet Take 6 mg by mouth at bedtime.     No current facility-administered medications on file prior to visit.    There are no Patient Instructions on file for this visit. No follow-ups on file.   Georgiana Spinner, NP

## 2023-02-21 ENCOUNTER — Ambulatory Visit: Payer: Medicaid Other | Admitting: Podiatry

## 2023-02-23 HISTORY — PX: CARDIAC CATHETERIZATION: SHX172

## 2023-02-24 HISTORY — PX: RIGHT/LEFT HEART CATH AND CORONARY ANGIOGRAPHY: CATH118266

## 2023-03-02 ENCOUNTER — Ambulatory Visit: Payer: Medicaid Other | Admitting: Podiatry

## 2023-03-09 ENCOUNTER — Ambulatory Visit: Payer: Medicaid Other | Admitting: Podiatry

## 2023-03-09 DIAGNOSIS — M79674 Pain in right toe(s): Secondary | ICD-10-CM

## 2023-03-09 DIAGNOSIS — B351 Tinea unguium: Secondary | ICD-10-CM

## 2023-03-09 DIAGNOSIS — M79675 Pain in left toe(s): Secondary | ICD-10-CM

## 2023-03-09 NOTE — Progress Notes (Signed)
  Subjective:  Patient ID: Theresa Phelps, female    DOB: 1960/03/11,  MRN: [id number]  Chief Complaint  Patient presents with   Toe Pain    Left foot 3rd toe pain    63 y.o. female returns for the above complaint.  Patient presents with thickened and onychodystrophy mycotic toenails x 10 mild pain on palpation worse with ambulation is with pressure patient would like to maintain reduction has not been given herself denies any other acute signs  Objective:  There were no vitals filed for this visit. Podiatric Exam: Vascular: dorsalis pedis and posterior tibial pulses are palpable bilateral. Capillary return is immediate. Temperature gradient is WNL. Skin turgor WNL  Sensorium: Normal Semmes Weinstein monofilament test. Normal tactile sensation bilaterally. Nail Exam: Pt has thick disfigured discolored nails with subungual debris noted bilateral entire nail hallux through fifth toenails.  Pain on palpation to the nails. Ulcer Exam: There is no evidence of ulcer or pre-ulcerative changes or infection. Orthopedic Exam: Muscle tone and strength are WNL. No limitations in general ROM. No crepitus or effusions noted.  Skin: No Porokeratosis. No infection or ulcers    Assessment & Plan:  No diagnosis found.  Patient was evaluated and treated and all questions answered.  Onychomycosis with pain  -Nails palliatively debrided as below. -Educated on self-care  Procedure: Nail Debridement Rationale: pain  Type of Debridement: manual, sharp debridement. Instrumentation: Nail nipper, rotary burr. Number of Nails: 10  Procedures and Treatment: Consent by patient was obtained for treatment procedures. The patient understood the discussion of treatment and procedures well. All questions were answered thoroughly reviewed. Debridement of mycotic and hypertrophic toenails, 1 through 5 bilateral and clearing of subungual debris. No ulceration, no infection noted.  Return Visit-Office Procedure:  Patient instructed to return to the office for a follow up visit 3 months for continued evaluation and treatment.  Nicholes Rough, DPM    No follow-ups on file.

## 2023-03-13 ENCOUNTER — Other Ambulatory Visit (INDEPENDENT_AMBULATORY_CARE_PROVIDER_SITE_OTHER): Payer: Self-pay | Admitting: Nurse Practitioner

## 2023-03-13 DIAGNOSIS — I6523 Occlusion and stenosis of bilateral carotid arteries: Secondary | ICD-10-CM

## 2023-03-13 DIAGNOSIS — I739 Peripheral vascular disease, unspecified: Secondary | ICD-10-CM

## 2023-03-15 ENCOUNTER — Ambulatory Visit (INDEPENDENT_AMBULATORY_CARE_PROVIDER_SITE_OTHER): Payer: Medicaid Other | Admitting: Nurse Practitioner

## 2023-03-15 ENCOUNTER — Encounter (INDEPENDENT_AMBULATORY_CARE_PROVIDER_SITE_OTHER)

## 2023-03-15 ENCOUNTER — Encounter (INDEPENDENT_AMBULATORY_CARE_PROVIDER_SITE_OTHER): Payer: Medicaid Other

## 2023-04-17 ENCOUNTER — Other Ambulatory Visit: Payer: Self-pay | Admitting: Family Medicine

## 2023-04-17 DIAGNOSIS — Z1231 Encounter for screening mammogram for malignant neoplasm of breast: Secondary | ICD-10-CM

## 2023-04-19 ENCOUNTER — Ambulatory Visit
Admission: RE | Admit: 2023-04-19 | Discharge: 2023-04-19 | Disposition: A | Discharge status: Home / Self Care | Source: Ambulatory Visit | Attending: Family Medicine | Admitting: Family Medicine

## 2023-04-19 DIAGNOSIS — Z1231 Encounter for screening mammogram for malignant neoplasm of breast: Secondary | ICD-10-CM | POA: Diagnosis present

## 2023-05-18 ENCOUNTER — Telehealth: Payer: Self-pay | Admitting: Podiatry

## 2023-05-18 NOTE — Telephone Encounter (Signed)
 Pt called in to inform provider her toes haven't straightened out since he saw her on 03/09/2023. Is surgery an option ?

## 2023-05-23 ENCOUNTER — Ambulatory Visit (INDEPENDENT_AMBULATORY_CARE_PROVIDER_SITE_OTHER)

## 2023-05-23 ENCOUNTER — Ambulatory Visit: Admitting: Podiatry

## 2023-05-23 ENCOUNTER — Encounter (INDEPENDENT_AMBULATORY_CARE_PROVIDER_SITE_OTHER): Payer: Self-pay

## 2023-05-23 DIAGNOSIS — M2042 Other hammer toe(s) (acquired), left foot: Secondary | ICD-10-CM | POA: Diagnosis not present

## 2023-05-23 DIAGNOSIS — M24575 Contracture, left foot: Secondary | ICD-10-CM

## 2023-05-23 DIAGNOSIS — Z01818 Encounter for other preprocedural examination: Secondary | ICD-10-CM

## 2023-05-23 NOTE — Progress Notes (Signed)
 Subjective:  Patient ID: Theresa Phelps, female    DOB: [birth year],  MRN: [id number]  Chief Complaint  Patient presents with   Toe Pain    63 y.o. female presents with the above complaint.  Patient presents with left second third fourth digit hammertoe contracture painful to touch is progressive gotten worse worse with ambulation hurts with pressure she has tried shoe gear modification padding offloading protecting them nothing has helped.  She wishes to discuss surgical options she is not a diabetic.  She is tired of the pain that she gets from it.  Pain scale is 5 out of 10 dull aching nature   Review of Systems: Negative except as noted in the HPI. Denies N/V/F/Ch.  Past Medical History:  Diagnosis Date   Acid reflux    Arthritis    rheumatoid arthritis   Asthma    Carpal tunnel syndrome    LEFT   CHF (congestive heart failure) (HCC)    Coronary artery disease    High cholesterol    Myocardial infarction [facility])    Peripheral vascular disease (HCC)    Pre-diabetes    Pulmonary hypertension (HCC)    Severe mitral regurgitation     Current Outpatient Medications:    acetaminophen  (TYLENOL ) 500 MG tablet, Take 2 tablets (1,000 mg total) by mouth every 6 (six) hours as needed., Disp: 30 tablet, Rfl: 0   amLODipine  (NORVASC ) 5 MG tablet, Take 5 mg by mouth daily., Disp: , Rfl:    atorvastatin  (LIPITOR ) 80 MG tablet, Take 80 mg by mouth daily., Disp: , Rfl:    benzonatate  (TESSALON ) 200 MG capsule, Take 1 capsule (200 mg total) by mouth 2 (two) times daily as needed for cough., Disp: 30 capsule, Rfl: 0   cetirizine (ZYRTEC) 10 MG tablet, Take 10 mg by mouth daily. Reported on 05/28/2015, Disp: , Rfl:    clopidogrel  (PLAVIX ) 75 MG tablet, Take 75 mg by mouth once., Disp: , Rfl:    EPINEPHrine  0.3 mg/0.3 mL IJ SOAJ injection, Inject into the muscle as directed., Disp: , Rfl:    ezetimibe  (ZETIA ) 10 MG tablet, Take 10 mg by mouth daily., Disp: , Rfl:    fluticasone  (FLONASE ) 50  MCG/ACT nasal spray, Place 2 sprays into both nostrils daily., Disp: , Rfl:    folic acid  (FOLVITE ) 1 MG tablet, Take by mouth., Disp: , Rfl:    furosemide  (LASIX ) 40 MG tablet, , Disp: , Rfl:    gabapentin  (NEURONTIN ) 100 MG capsule, Take 1 capsule (100 mg total) by mouth 2 (two) times daily., Disp: 60 capsule, Rfl: 6   loratadine  (CLARITIN ) 10 MG tablet, Take 10 mg by mouth daily., Disp: , Rfl:    nitroGLYCERIN  (NITROSTAT ) 0.4 MG SL tablet, Place under the tongue., Disp: , Rfl:    nitroGLYCERIN  (NITROSTAT ) 0.4 MG SL tablet, Place 0.4 mg under the tongue every 5 (five) minutes as needed for chest pain., Disp: , Rfl:    traMADol  (ULTRAM ) 50 MG tablet, Take 1 tablet (50 mg total) by mouth every 6 (six) hours as needed., Disp: 10 tablet, Rfl: 0   warfarin (COUMADIN ) 6 MG tablet, Take 6 mg by mouth at bedtime., Disp: , Rfl:   Social History   Tobacco Use  Smoking Status Former   Current packs/day: 0.00   Average packs/day: 0.5 packs/day for 20.0 years (10.0 ttl pk-yrs)   Types: Cigarettes   Start date: 12/1998   Quit date: 12/2018   Years since quitting: 4.4  Smokeless Tobacco Never  Allergies  Allergen Reactions   Shellfish Allergy Anaphylaxis   Banana     Other reaction(s): Other (See Comments) Burning in the mouth   Garlic     Other reaction(s): Other (See Comments) Pt states arms freeze up and can't talk but no SOB   Isosorbide Nitrate Itching    Blurred vision   Fish-Derived Products Rash   Objective:  There were no vitals filed for this visit. There is no height or weight on file to calculate BMI. Constitutional Well developed. Well nourished.  Vascular Dorsalis pedis pulses palpable bilaterally. Posterior tibial pulses palpable bilaterally. Capillary refill normal to all digits.  No cyanosis or clubbing noted. Pedal hair growth normal.  Neurologic Normal speech. Oriented to person, place, and time. Epicritic sensation to light touch grossly present bilaterally.   Dermatologic Nails well groomed and normal in appearance. No open wounds. No skin lesions.  Orthopedic: Pain on palpation to semiflexible hammertoe contracture of 2nd, 3rd and 4th digit with metatarsophalangeal contracture noted to his respective joints.  Pain on palpation.  No pain at the hallux of the fifth digit.   Radiographs: 3 views of the show mature adult1 else prior to seeing me.  Left foot: Hammertoe contracture of 2nd, 3rd and 4th digit noted.  Midfoot arthritis noted.  Plantar and posterior heel spurring noted.  No other abnormalities identified Assessment:   1. Hammer toe of left foot   2. Encounter for preoperative examination for general surgical procedure   3. Joint contracture of foot, left    Plan:  Patient was evaluated and treated and all questions answered.  Left 2nd, 3rd and 4th digit hammertoe contracture with 2nd, 3rd and 4th metatarsophalangeal joint contracture - All questions and concerns were discussed with the patient in extensive detail given the amount of contracture that is present patient will benefit from left 2nd, 3rd and 4th hammertoe arthroplasty with capsulotomy of 2nd, 3rd and 4th metatarsophalangeal joint.  I discussed my preoperative intra postoperative plan with the patient in extensive detail she would benefit from surgical intervention at this time she agrees with the plan and would like to proceed with surgery -Informed surgical risk consent was reviewed and read aloud to the patient.  I reviewed the films.  I have discussed my findings with the patient in great detail.  I have discussed all risks including but not limited to infection, stiffness, scarring, limp, disability, deformity, damage to blood vessels and nerves, numbness, poor healing, need for braces, arthritis, chronic pain, amputation, death.  All benefits and realistic expectations discussed in great detail.  I have made no promises as to the outcome.  I have provided realistic  expectations.  I have offered the patient a 2nd opinion, which they have declined and assured me they preferred to proceed despite the risks   No follow-ups on file.

## 2023-06-06 DIAGNOSIS — Z0271 Encounter for disability determination: Secondary | ICD-10-CM

## 2023-06-06 NOTE — Telephone Encounter (Signed)
 s/w pt and gave ok to s/w April. advised April faxed forms for her leave to care for pt to [employer] [phone] and would email her copy to [email or web address] for her records leave 06/04/23-06/03/24 approx-dates on form

## 2023-06-13 NOTE — Telephone Encounter (Signed)
 April lft mess on my vmail. Called back and lft mess on her vmail to call me back or email me in regards to her fmla to care for her mom

## 2023-06-16 NOTE — Telephone Encounter (Signed)
 s/w April and confirmed info for new forms from [employer] leave 7/7-7/31/25 and intermittent 08/04/23-06/03/24. I will email her copy and fax [facility] [phone]

## 2023-06-27 ENCOUNTER — Telehealth: Payer: Self-pay | Admitting: Podiatry

## 2023-06-27 NOTE — Telephone Encounter (Signed)
 PER [employer] AUTHOR[employer]ATION ID # [id number] VALID 07/10/23 THRU 09/07/2023 FOR CPT 71714.

## 2023-07-04 DIAGNOSIS — M2042 Other hammer toe(s) (acquired), left foot: Secondary | ICD-10-CM

## 2023-07-04 HISTORY — DX: Other hammer toe(s) (acquired), left foot: M20.42

## 2023-07-05 ENCOUNTER — Telehealth: Payer: Self-pay | Admitting: Podiatry

## 2023-07-05 NOTE — Telephone Encounter (Signed)
 Called pt as we received a message from [facility] that they are not able to do pts surgery due to hx of cardiac issues.  We are going to move surgery to [facility] on 08/14/23 and pt is aware.

## 2023-07-10 ENCOUNTER — Telehealth: Payer: Self-pay | Admitting: Podiatry

## 2023-07-10 ENCOUNTER — Encounter: Payer: Self-pay | Admitting: Podiatry

## 2023-07-10 NOTE — Telephone Encounter (Signed)
 mess form April left on my vmail. Cld and she advise DOS is now 08/14/23. Revise/update her leave request with [facility] . Continuous 08/14/23-09/08/23, intermittent 09/11/23-09/10/24. I will send her a copy of updted as well.

## 2023-07-18 ENCOUNTER — Encounter: Admitting: Podiatry

## 2023-07-27 NOTE — Progress Notes (Signed)
 Documentation for warfarin management Last 3 INR results:  Lab Results  Component Value Date   INR 2.8 (!) 07/27/2023   INR 2.2 (!) 07/14/2023   INR 4.0 (H) 06/26/2023    Dosage adjustment or any change in the treatment regimen today: No. INR within goal range. Patient advised to continue current warfarin dosing regimen of 25mg  total weekly dose, 5mg  (5mg  x 1) Mon,Wed,Fri and 2.5mg  (5mg  x 0.5) all other days of the week.   Management of result: nurse driven protocol implemented  Patient/caregiver education provided during today's visit: dosing.  Patient/caregiver did verbalize understanding of the no change in dose and any education provided.   The patient/caregiver is instructed to return in two weeks for recheck.  Future Appointments     Date/Time Provider Department Center Visit Type   08/04/2023 1:00 PM (Arrive by 12:45 PM) Hassan Kreg Blush, NP [facility] NEW PATIENT   08/04/2023 1:40 PM (Arrive by 1:20 PM) CC CT 1 Cancer Center  CT Cancer Ctr CT LUNG CANCER SCREENING   08/11/2023 10:30 AM [facility] WEST CARDIO NURSE POD B [facility] C NURSE VISIT   08/11/2023 10:30 AM KC[facility]-ANTICOAG [facility] C ANTI-COAG   09/08/2023 10:45 AM Callwood, Cara Endow, MD [facility] C FOLLOW UP   09/25/2023 1:30 PM (Arrive by 1:15 PM) Alvan Arvella Dover, MD; [facility] [place]  [facility] Th[facility] RETURN VISIT   05/30/2024 1:30 PM (Arrive by 1:15 PM) Eliverto Bette Hover, MD [facility] PHYSICAL   08/01/2024 8:20 AM (Arrive by 8:00 AM) Joshua Elsie Batter, MD [facility] 2nd Floor [facility] C[facility] RETURN

## 2023-07-31 ENCOUNTER — Encounter: Payer: Self-pay | Admitting: Podiatry

## 2023-08-01 ENCOUNTER — Encounter: Admitting: Podiatry

## 2023-08-07 NOTE — Progress Notes (Signed)
 Chief Complaint  Patient presents with  . Form- Other    Surgery clearance form    Subjective  Theresa Phelps is a 63 y.o. female who presents for Form- Other (Surgery clearance form) HPI History of Present Illness Theresa Phelps is a 63 year old female who presents for pre-operative evaluation for upcoming surgery.  She is scheduled for surgery next Monday, which was initially planned for a different date but was rescheduled. She has been waiting for this surgery for a long time.  She has a history of atrial fibrillation and is currently in sinus rhythm. She is on warfarin, managed by her cardiologist. In the past, she has not required additional anticoagulation prior to surgeries.  Patient reports she was told by the cardiologist that the cardiology nurse will call her to let her know when she needs to stop the warfarin.  She said the cardiologist told her that she does not need any bridge anticoagulant as she stops the warfarin.  Her last comprehensive panel in February showed slightly low creatinine but good glucose levels. She is due for a cholesterol check today, as her cholesterol has been high in the past. She is currently on  atorvastatin  80 mg nightly. She has an Epipen  available for emergencies.  Essential hypertension appears controlled BP Readings from Last 3 Encounters:  08/07/23 118/78  08/04/23 107/56  07/31/23 104/68    Review of Systems  Patient Active Problem List  Diagnosis  . History of smoking  . Peripheral artery disease ()  . Hyperlipidemia  . Prediabetes  . Bilateral carotid artery stenosis  . Coronary artery disease involving native coronary artery of native heart  . Carotid artery stenosis, asymptomatic, right  . Pulmonary hypertension (CMS/HHS-HCC)  . S/P CABG x 2  . 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CMS/HHS-HCC)  . AV block, 1st degree  . Warfarin anticoagulation  . S/P mitral valve replacement with metallic valve  . S/P insertion of iliac artery  stent  . Red blood cell antibody positive  . Rheumatoid arthritis, seropositive (CMS/HHS-HCC)  . High risk medication use  . Centrilobular emphysema (CMS/HHS-HCC)  . Primary osteoarthritis of both knees  . S/P drug eluting coronary stent placement  . Bradycardia  . Pain of cervical spine  . Cervical radiculopathy  . Strain of neck muscle  . Ischemic leg  . Obesity  . Lumbar radiculopathy    Outpatient Medications Prior to Visit  Medication Sig Dispense Refill  . acetaminophen  (TYLENOL ) 500 MG tablet Take 1 tablet (500 mg total) by mouth every 8 (eight) hours as needed for Pain 30 tablet 2  . amLODIPine  (NORVASC ) 5 MG tablet Take 1 tablet (5 mg total) by mouth once daily 90 tablet 3  . cetirizine (ZYRTEC) 10 MG tablet Take 1 tablet (10 mg total) by mouth once daily 30 tablet 11  . EPINEPHrine  (EPIPEN ) 0.3 mg/0.3 mL auto-injector Inject 0.3 mg into the muscle once    . ezetimibe  (ZETIA ) 10 mg tablet Take 1 tablet (10 mg total) by mouth once daily 90 tablet 3  . folic acid  (FOLVITE ) 1 MG tablet Take 1 tablet (1 mg total) by mouth once daily 30 tablet 11  . gabapentin  (NEURONTIN ) 100 MG capsule Take 100 mg by mouth 2 (two) times daily    . nitroGLYcerin  (NITROSTAT ) 0.4 MG SL tablet Place 1 tablet (0.4 mg total) under the tongue every 5 (five) minutes as needed for Chest pain May take up to 3 doses. 25 tablet 0  . semaglutide (WEGOVY) 0.5 mg/0.5  mL pen injector Inject 0.5 mLs (0.5 mg total) subcutaneously once a week for 60 days 2 mL 1  . traMADoL  (ULTRAM ) 50 mg tablet Take 50 mg by mouth every 6 (six) hours as needed for Pain    . warfarin (COUMADIN ) 10 MG tablet TAKE 1 TABLET BY MOUTH EVERY DAY 90 tablet 0  . warfarin (COUMADIN ) 5 MG tablet Take 1 tablet (5 mg total) by mouth as directed 90 tablet 3  . warfarin (COUMADIN ) 6 MG tablet Take 1 tablet (6 mg total) by mouth once daily 90 tablet 1  . alirocumab (PRALUENT) 75 mg/mL pen injector Inject 75 mg subcutaneously every 14 (fourteen)  days 6 mL 3  . atorvastatin  (LIPITOR ) 80 MG tablet Take 1 tablet (80 mg total) by mouth once daily 90 tablet 1  . warfarin (COUMADIN ) 1 MG tablet Take 1 tablet (1 mg total) by mouth once daily (Patient not taking: Reported on 05/18/2023) 30 tablet 5  . warfarin (COUMADIN ) 5 MG tablet Take 1 tablet (5 mg total) by mouth once daily As directed (Patient not taking: Reported on 07/05/2023) 30 tablet 5   No facility-administered medications prior to visit.      Objective  Vitals:   08/07/23 1037  BP: 118/78  Pulse: 55  SpO2: 99%  Weight: (!) 104.3 kg (230 lb)  PainSc: 10-Worst pain ever  PainLoc: Foot   Body mass index is 33 kg/m.  Home Vitals:     Physical Exam Physical Exam    Constitutional: alert, obese, in NAD, and communicates well Eye exam: pupils equal and reactive, extraocular eye movements intact. Neck: supple, no thyroid enlargement or cervical adenopathy, and no bruits heard Respiratory: clear to auscultation, without rales or wheezes  Cardiovascular: regular rate and rhythm and without murmurs, rubs or gallops Lower extremities: no lower extremity edema Skin ankles/feet: warm, good capillary refill Neurological: sensorimotor grossly intact and normal muscle tone  Results LABS Creatinine: low (02/2023)  DIAGNOSTIC EKG: sinus rhythm (08/07/2023)     Assessment/Plan:   Assessment & Plan Pre surgery examination Pre-surgery examination for upcoming foot surgery scheduled for next Monday. Previous comprehensive panel in February showed slightly low GFR and good glucose levels. Cholesterol levels have been high, managed with atorvastatin  80 mg. - Order comprehensive panel to repeat creatinine and glucose levels - Check cholesterol levels - Send medication list to the surgeon  Hypercholesterolemia with severe allergy to atorvastatin  Hypercholesterolemia managed with atorvastatin  80 mg nightly. Severe allergy to atorvastatin , has been removed from her medication  list. - Continue with atorvastatin   Essential hypertension The blood pressure appears to be appropriately controlled. Continue with current medications.  See medications list. Follow up in 3 months.  Atrial fibrillation, history of, currently in sinus rhythm Atrial fibrillation currently in sinus rhythm, managed with warfarin under cardiologist's supervision. Cardiologist's nurse will inform her when to stop warfarin before surgery.  Patient was told by the cardiologist no additional anticoagulation needed prior to surgery, she was told there is no need of a bridge anticoagulant. - Coordinate with cardiologist regarding warfarin management before and after surgery Diagnoses and all orders for this visit:  Paroxysmal atrial fibrillation (CMS/HHS-HCC)  Pure hypercholesterolemia -     Lipid Panel W/Reflex Direct Low Density Lipoprotein (LDL) Cholesterol; Future -     atorvastatin  (LIPITOR ) 80 MG tablet; Take 1 tablet (80 mg total) by mouth once daily  Hypertension, essential -     Comprehensive Metabolic Panel (CMP); Future  Postoperative examination    This visit  was coded based on medical decision making (MDM).           Future Appointments     Date/Time Provider Department Center Visit Type   08/11/2023 10:30 AM [facility] WEST CARDIO NURSE POD B [facility] C NURSE VISIT   08/11/2023 10:30 AM KC[facility]-ANTICOAG [facility] MARYL BROCKS ANTI-COAG   09/08/2023 10:45 AM Callwood, Cara Endow, MD [facility] C FOLLOW UP   09/25/2023 1:30 PM (Arrive by 1:15 PM) Alvan Arvella Dover, MD; [facility] [place]  Eye Ear Nose and Throat N[facility] N[facility] RETURN VISIT   10/27/2023 1:40 PM (Arrive by 1:20 PM) CC CT 5 Cancer Center  CT Cancer Ctr CT LUNG CANCER SCREENING   10/27/2023 3:00 PM (Arrive by 2:45 PM) Hassan Kreg Blush, NP [facility] RETURN VISIT   05/30/2024 1:30 PM (Arrive by 1:15 PM) Eliverto Bette Hover,  MD [facility] PHYSICAL   08/01/2024 8:20 AM (Arrive by 8:00 AM) Joshua Elsie Batter, MD [facility] 2nd Floor [facility] C[facility] RETURN       There are no Patient Instructions on file for this visit.  An after visit summary was provided for the patient either in written format (printed) or through [facility].  This note has been created using automated tools and reviewed for accuracy by MARIO E OLMEDO.

## 2023-08-08 ENCOUNTER — Other Ambulatory Visit: Payer: Self-pay

## 2023-08-08 ENCOUNTER — Encounter
Admission: RE | Admit: 2023-08-08 | Discharge: 2023-08-08 | Disposition: A | Discharge status: Home / Self Care | Source: Ambulatory Visit | Attending: Podiatry | Admitting: Podiatry

## 2023-08-08 VITALS — Ht 70.0 in | Wt 230.0 lb

## 2023-08-08 DIAGNOSIS — Z951 Presence of aortocoronary bypass graft: Secondary | ICD-10-CM

## 2023-08-08 DIAGNOSIS — Z01810 Encounter for preprocedural cardiovascular examination: Secondary | ICD-10-CM

## 2023-08-08 DIAGNOSIS — Z7901 Long term (current) use of anticoagulants: Secondary | ICD-10-CM

## 2023-08-08 DIAGNOSIS — Z01812 Encounter for preprocedural laboratory examination: Secondary | ICD-10-CM

## 2023-08-08 HISTORY — DX: Atrioventricular block, first degree: I44.0

## 2023-08-08 HISTORY — DX: Occlusion and stenosis of bilateral carotid arteries: I65.23

## 2023-08-08 HISTORY — DX: Atherosclerotic heart disease of native coronary artery without angina pectoris: I25.10

## 2023-08-08 HISTORY — DX: Unspecified atrial fibrillation: I97.89

## 2023-08-08 HISTORY — DX: Bilateral primary osteoarthritis of knee: M17.0

## 2023-08-08 HISTORY — DX: Rheumatoid arthritis, unspecified: M06.9

## 2023-08-08 HISTORY — DX: Peripheral vascular disease, unspecified: I73.9

## 2023-08-08 HISTORY — DX: Radiculopathy, cervical region: M54.12

## 2023-08-08 HISTORY — DX: Centrilobular emphysema: J43.2

## 2023-08-08 HISTORY — DX: Long term (current) use of anticoagulants: Z79.01

## 2023-08-08 HISTORY — DX: Hyperlipidemia, unspecified: E78.5

## 2023-08-08 HISTORY — DX: Radiculopathy, lumbar region: M54.16

## 2023-08-08 NOTE — Patient Instructions (Addendum)
 Your procedure is scheduled on: Monday, August 11 Report to the Registration Desk on the 1st floor of the [facility] To find out your arrival time, please call [phone] between 1PM - 3PM on: Friday, August 8 If your arrival time is 6:00 am, do not arrive before that time as the [facility] entrance doors do not open until 6:00 am.  REMEMBER: Instructions that are not followed completely may result in serious medical risk, up to and including death; or upon the discretion of your surgeon and anesthesiologist your surgery may need to be rescheduled.  Do not eat food after midnight the night before surgery.  No gum chewing or hard candies.  You may however, drink CLEAR liquids up to 2 hours before you are scheduled to arrive for your surgery. Do not drink anything within 2 hours of your scheduled arrival time.  Clear liquids include: - water  - apple juice without pulp - gatorade (not RED colors) - black coffee or tea (Do NOT add milk or creamers to the coffee or tea) Do NOT drink anything that is not on this list.  One week prior to surgery: Stop Anti-inflammatories (NSAIDS) such as Advil, Aleve , Ibuprofen, Motrin, Naproxen , Naprosyn  and Aspirin  based products such as Excedrin, Goody's Powder, BC Powder. Stop ANY OVER THE COUNTER supplements until after surgery.  You may however, continue to take Tylenol  if needed for pain up until the day of surgery.  warfarin (COUMADIN ) per Dr. Florencio - hold for 5 days before surgery. Last day to take is Tuesday, August 5. Resume 1 day AFTER surgery on August 12.   Tzhncb - hold for 7 days before surgery. Do Not take until AFTER surgery.  Continue taking all of your other prescription medications up until the day of surgery.  ON THE DAY OF SURGERY ONLY TAKE THESE MEDICATIONS WITH SIPS OF WATER:  amLODipine   Atorvastatin  ezetimibe    No Alcohol for 24 hours before or after surgery.  No Smoking including e-cigarettes for 24 hours  before surgery.  No chewable tobacco products for at least 6 hours before surgery.  No nicotine patches on the day of surgery.  Do not use any recreational drugs for at least a week (preferably 2 weeks) before your surgery.  Please be advised that the combination of cocaine and anesthesia may have negative outcomes, up to and including death. If you test positive for cocaine, your surgery will be cancelled.  On the morning of surgery brush your teeth with toothpaste and water, you may rinse your mouth with mouthwash if you wish. Do not swallow any toothpaste or mouthwash.  Use CHG Soap as directed on instruction sheet.  Do not wear jewelry, make-up, hairpins, clips or nail polish.  For welded (permanent) jewelry: bracelets, anklets, waist bands, etc.  Please have this removed prior to surgery.  If it is not removed, there is a chance that hospital personnel will need to cut it off on the day of surgery.  Do not wear lotions, powders, or perfumes.   Do not shave body hair from the neck down 48 hours before surgery.  Contact lenses, hearing aids and dentures may not be worn into surgery.  Do not bring valuables to the hospital. [facility] is not responsible for any missing/lost belongings or valuables.   Notify your doctor if there is any change in your medical condition (cold, fever, infection).  Wear comfortable clothing (specific to your surgery type) to the hospital.  After surgery, you can help  prevent lung complications by doing breathing exercises.  Take deep breaths and cough every 1-2 hours.   If you are being discharged the day of surgery, you will not be allowed to drive home. You will need a responsible individual to drive you home and stay with you for 24 hours after surgery.   If you are taking public transportation, you will need to have a responsible individual with you.  Please call the Pre-admissions Testing Dept. at [phone] if you have any questions  about these instructions.  Surgery Visitation Policy:  Patients having surgery or a procedure may have two visitors.  Children under the age of 32 must have an adult with them who is not the patient.  Merchandiser, retail to address health-related social needs:  [email or web address]       Preparing for Surgery with CHLORHEXIDINE  GLUCONATE (CHG) Soap  Chlorhexidine  Gluconate (CHG) Soap  o An antiseptic cleaner that kills germs and bonds with the skin to continue killing germs even after washing  o Used for showering the night before surgery and morning of surgery  Before surgery, you can play an important role by reducing the number of germs on your skin.  CHG (Chlorhexidine  gluconate) soap is an antiseptic cleanser which kills germs and bonds with the skin to continue killing germs even after washing.  Please do not use if you have an allergy to CHG or antibacterial soaps. If your skin becomes reddened/irritated stop using the CHG.  1. Shower the NIGHT BEFORE SURGERY and the MORNING OF SURGERY with CHG soap.  2. If you choose to wash your hair, wash your hair first as usual with your normal shampoo.  3. After shampooing, rinse your hair and body thoroughly to remove the shampoo.  4. Use CHG as you would any other liquid soap. You can apply CHG directly to the skin and wash gently with a scrungie or a clean washcloth.  5. Apply the CHG soap to your body only from the neck down. Do not use on open wounds or open sores. Avoid contact with your eyes, ears, mouth, and genitals (private parts). Wash face and genitals (private parts) with your normal soap.  6. Wash thoroughly, paying special attention to the area where your surgery will be performed.  7. Thoroughly rinse your body with warm water.  8. Do not shower/wash with your normal soap after using and rinsing off the CHG soap.  9. Pat yourself dry with a clean towel.  10. Wear clean pajamas to bed the  night before surgery.  12. Place clean sheets on your bed the night of your first shower and do not sleep with pets.  13. Shower again with the CHG soap on the day of surgery prior to arriving at the hospital.  14. Do not apply any deodorants/lotions/powders.  15. Please wear clean clothes to the hospital.

## 2023-08-09 ENCOUNTER — Encounter
Admission: RE | Admit: 2023-08-09 | Discharge: 2023-08-09 | Disposition: A | Discharge status: Home / Self Care | Source: Ambulatory Visit | Attending: Podiatry | Admitting: Podiatry

## 2023-08-09 DIAGNOSIS — Z01810 Encounter for preprocedural cardiovascular examination: Secondary | ICD-10-CM

## 2023-08-09 DIAGNOSIS — Z01812 Encounter for preprocedural laboratory examination: Secondary | ICD-10-CM

## 2023-08-09 DIAGNOSIS — Z951 Presence of aortocoronary bypass graft: Secondary | ICD-10-CM | POA: Insufficient documentation

## 2023-08-09 DIAGNOSIS — R001 Bradycardia, unspecified: Secondary | ICD-10-CM | POA: Insufficient documentation

## 2023-08-09 DIAGNOSIS — Z01818 Encounter for other preprocedural examination: Secondary | ICD-10-CM | POA: Diagnosis present

## 2023-08-09 HISTORY — DX: Carpal tunnel syndrome, left upper limb: G56.02

## 2023-08-09 LAB — BASIC METABOLIC PANEL WITH GFR
Anion gap: 7 (ref 5–15)
BUN: 14 mg/dL (ref 8–23)
CO2: 27 mmol/L (ref 22–32)
Calcium: 9.3 mg/dL (ref 8.9–10.3)
Chloride: 106 mmol/L (ref 98–111)
Creatinine, Ser: 1 mg/dL (ref 0.44–1.00)
GFR, Estimated: >60 mL/min (ref >60)
Glucose, Bld: 114 mg/dL — ABNORMAL HIGH (ref 70–99)
Potassium: 4 mmol/L (ref 3.5–5.1)
Sodium: 140 mmol/L (ref 135–145)

## 2023-08-09 LAB — CBC
HCT: 46.6 % — ABNORMAL HIGH (ref 36.0–46.0)
Hemoglobin: 15.1 g/dL — ABNORMAL HIGH (ref 12.0–15.0)
MCH: 29 pg (ref 26.0–34.0)
MCHC: 32.4 g/dL (ref 30.0–36.0)
MCV: 89.6 fL (ref 80.0–100.0)
Platelets: 217 K/uL (ref 150–400)
RBC: 5.2 MIL/uL — ABNORMAL HIGH (ref 3.87–5.11)
RDW: 13 % (ref 11.5–15.5)
WBC: 10.2 K/uL (ref 4.0–10.5)
nRBC: 0 % (ref 0.0–0.2)

## 2023-08-11 ENCOUNTER — Telehealth: Payer: Self-pay | Admitting: Podiatry

## 2023-08-11 ENCOUNTER — Encounter: Payer: Self-pay | Admitting: Podiatry

## 2023-08-11 NOTE — H&P (View-Only) (Signed)
 Perioperative / Anesthesia Services  Pre-Admission Testing Clinical Review / Pre-Operative Anesthesia Consult  Date: 08/11/23  PATIENT DEMOGRAPHICS: Name: Theresa Phelps DOB: [birth year] MRN:   [id number]  Note: Available PAT nursing documentation and vital signs have been reviewed. Clinical nursing staff has updated patient's PMH/PSHx, current medication list, and drug allergies/intolerances to ensure complete and comprehensive history available to assist care teams in MDM as it pertains to the aforementioned surgical procedure and anticipated anesthetic course. Extensive review of available clinical information personally performed. [facility] and PSH[facility] updated with any diagnoses/procedures that  may have been inadvertently omitted during her intake with the pre-admission testing department's nursing staff.  PLANNED SURGICAL PROCEDURE(S):   Case: [id number] Date/Time: 08/14/23 0815   Procedures:      CORRECTION, HAMMER TOE (Left: Toe) - IV SEDATION     CAPSULOTOMY, MTP JOINT (Left)   Anesthesia type: Monitor Anesthesia Care   Diagnosis:      Hammer toe of left foot [M20.42]     Foot contracture, left [M24.575]   Pre-op diagnosis:      HAMMER TOE OF LEFT FOOT     FOOT CONTRACTURE, LEFT   Location: [facility] OR ROOM 08 / [facility]   Surgeons: Tobie Franky SQUIBB, DPM        CLINICAL DISCUSSION: Theresa Phelps is a 63 y.o. female who is submitted for pre-surgical anesthesia review and clearance prior to her undergoing the above procedure. Patient is a Former Smoker (10 pack years; quit 12/2018). Pertinent PMH includes: CAD s/p CABG), MI, severe mitral valve prolapse and regurgitation (s/p MVR), HFrecEF, pulmonary hypertension, atrial fibrillation, CVA PAD, BILATERAL carotid artery stenosis, aortic atherosclerosis, first-degree AV block, HTN, HLD, prediabetes, pulmonary emphysema, asthma, GERD (no daily Tx), RA, OA, cervical and lumbar radiculopathy, hammertoe of LEFT  foot with contracture.  Patient is followed by cardiology Philippe, MD / Joshua, MD). She was last seen in the cardiology clinic on 05/18/2023; notes reviewed. At the time of her clinic visit, patient doing well overall from a cardiovascular perspective.  Patient with reports of chronic exertional dyspnea that was reported to be limiting her distance ambulation abilities over the last several months.  Patient also complaining of chronic leg pain associated with known neurogenic claudication.  She advises that pain had improved some with recent revascularization.  Patient denied any chest pain, shortness of breath, PND, orthopnea, palpitations, significant peripheral edema, weakness, fatigue, vertiginous symptoms, or presyncope/syncope. Patient with a past medical history significant for cardiovascular diagnoses. Documented physical exam was grossly benign, providing no evidence of acute exacerbation and/or decompensation of the patient's known cardiovascular conditions.  Of note, complete records regarding his cardiovascular history unavailable for review at time of consult.  Information gathered from patient report and from notes provided by her local specialty care providers.  Patient reported to have suffered an MI back in 2007.  Unclear as to what type of cardiovascular insult this was.  Unclear if PCI was performed.  Patient underwent diagnostic RIGHT/LEFT heart catheterization on 11/07/2017.  Study revealed multivessel CAD; 85% proximal-mid RCA, 85% ostial OM1, 80% OM1, 20% proximal-mid LCx, 40% proximal LAD, and 70% mid LAD.  Hemodynamics: mean PA = 52 mmHg, mean PCWP = 23 mmHg, AO saturation 87%, CO = 3.87 L/min, and CI = 2.01 L/min/m.  Findings consistent with severe pulmonary hypertension, which is an old diagnosis for this patient.  Patient with severe mitral valve regurgitation.  Given her valvular disease, and the degree/complexity of her coronary  artery disease, patient was referred to CVTS for  consideration of revascularization.  Patient underwent a two-vessel revascularization (CABG) on 12/13/2017.  LIMA-LAD and SVG-RCA bypass grafts were placed.  Additionally, during this procedure, patient underwent mitral valve replacement using a 29 mm St. Jude's mechanical valve.  Patient with a history of significant peripheral artery disease.  She underwent stenting of her bilateral iliac arteries in 2020.  Additionally, patient has undergone mechanical thrombectomy with Rotarex to the LEFT SFA and popliteal arteries in 2024.  CT imaging of the head performed on 07/20/2022 revealed a LEFT frontal lobe ACA territory infarct.  Radiology noted that this finding was new when compared to previous CT imaging of the head performed on 09/09/2014.  Patient with no significant appreciable deficits following neurological event.  Most recent TTE performed on 09/09/2022 revealed a normal left ventricular systolic function with an EF of >55%. There was mild concentric LVH.  There were no regional wall motion abnormalities. Left ventricular diastolic Doppler parameters consistent with pseudonormalization (G2DD).  Left ventricular GLS -13.0%.  Left atrium was mildly enlarged. Right ventricular size and function normal with a TAPSE measuring 0.90 cm  (normal range >/= 1.6 cm).  Mechanical mitral valve noted to be well-seated and functioning properly.  There was trivial to mild pan valvular regurgitation.  All transvalvular gradients were noted to be normal providing no evidence of hemodynamically significant valvular stenosis. Aorta normal in size with no evidence of ectasia or aneurysmal dilatation.  Most recent myocardial perfusion imaging study was performed on 09/09/2022 revealing a mildly decreased left ventricular systolic function with an EF of 45%.  Anterior and inferior segments/wall hypokinesis observed.  No artifact or left ventricular cavity size enlargement appreciated on review of imaging. SPECT images  demonstrated a large reversible perfusion abnormality of moderate intensity in the apical inferior and large lateral basal segments region on stress images.  Additionally, there were reversible defects in the anterior wall to a moderate degree.  There were apical inferior lateral basal segment defects consistent with scar or previous infarct.  TID ratio = 1.19.  Findings consistent with ischemia.  Study determined to be high risk and invasive evaluation recommended.  Repeat diagnostic RIGHT/LEFT heart catheterization was performed on 02/24/2023.  Study demonstrated similar coronary anatomy when compared to previous study performed back in 2021.  Previously placed stent to the LCx and moderate LIMA-LAD anastomosis stenosis was noted.  Hemodynamics revealed elevated left ventricular filling pressures and moderately low cardiac index.  Findings consistent with normal pulmonary hypertension.  Patient with an atrial fibrillation diagnosis; CHA2DS2-VASc Score = 6 (sex, HFpEF, HTN, CVA x 2, prior MI/vascular disease). Her rate and rhythm are currently being maintained intrinsically without the need for pharmacological intervention.  Given patient's mechanical valve and atrial fibrillation/flutter history, she is on daily anticoagulation therapy using warfarin.  Patient reportedly compliant with therapy with no evidence or reports of GI/GU related bleeding.  Blood pressure on the lower side at 94/60 mmHg on currently prescribed CCB (amlodipine ) monotherapy.  Patient is on atorvastatin  + ezetimibe  for her HLD diagnosis and further ASCVD prevention.  Patient has a supply of short acting nitrates (NTG) to use on a as needed basis for recurrent angina/anginal equivalent symptoms; denied recent use.  Patient has a prediabetes diagnosis that she is managing with diet lifestyle modification alone.  Most recent hemoglobin A1c was 6.4% when checked on 11/29/2022. Patient does not have an OSAH diagnosis.  Functional capacity  limited by patient's age and multiple medical comorbidities.  Over the past several months, patient has noted increased dyspnea with short distance ambulation.  This is limiting her ability to complete her ADLs/IADLs.  Per the DASI, patient questionably able to achieve 4 METS physical activity without experiencing at least to some degree, significant angina/anginal equivalent symptoms.  No changes were made to her medication regimen.  Patient follow-up with outpatient cardiology in 6 months or sooner if needed.  Theresa Phelps is scheduled for an elective CORRECTION, HAMMER TOE (Left: Toe); CAPSULOTOMY, MTP JOINT (Left) on 08/14/2023 with Dr. Franky Blanch, DPM.  Given patient's past medical history significant for cardiovascular diagnoses, presurgical cardiac clearance was sought by the PAT team. Per cardiology, this patient is optimized for surgery and may proceed with the planned procedural course with a ACCEPTABLE risk of significant perioperative cardiovascular complications.  Again, this patient is on daily oral anticoagulation therapy. She has been instructed on recommendations for holding her warfarin for 5 days prior to her procedure with plans to restart as soon as postoperative bleeding risk felt to be minimized by his primary attending surgeon. The patient has been instructed that her last dose of should be on 08/08/2023.  Conflicting information received regarding need for perioperative enoxaparin bridging.  Patient adamant that cardiology advised her that no bridging would be required.  PCP notes indicate the same.  Signed clearance form from cardiology indicates that patient will need bridging.  He had follow-up conversation with the office, it was determined that anticoagulation clinic would contact patient to facilitate.  Patient denies previous perioperative complications with anesthesia in the past. In review her EMR, it is noted that patient underwent a general anesthetic course here at  [facility] (ASA III) in 08/2021 without documented complications.   MOST RECENT VITAL SIGNS:    08/08/2023    3:27 PM 01/25/2023    1:12 PM 12/13/2022    2:00 PM  Vitals with BMI  Height 5' 10 5' 10   Weight 230 lbs 229 lbs 225 lbs 3 oz  BMI 33 32.86 32.31  Systolic  115 120  Diastolic  73 70  Pulse  60 55   PROVIDERS/SPECIALISTS: NOTE: Primary physician provider listed below. Patient may have been seen by APP or partner within same practice.   PROVIDER ROLE / SPECIALTY LAST OV  Blanch Franky SQUIBB, DPM Podiatry (Surgeon) 05/23/2023  [facility] Primary Care Provider 08/07/2022  Florencio Shine, MD Cardiology 03/24/2023  Joshua Batter, MD Cardiology 05/18/2023   ALLERGIES: Allergies  Allergen Reactions   Shellfish Allergy Anaphylaxis   Banana Other (See Comments)    Burning in the mouth   Garlic Other (See Comments)    Pt states arms freeze up and can't talk but no SOB   Isosorbide Nitrate Itching    Blurred vision   Fish-Derived Products Rash    CURRENT HOME MEDICATIONS: No current facility-administered medications for this encounter.    acetaminophen  (TYLENOL ) 500 MG tablet   amLODipine  (NORVASC ) 5 MG tablet   atorvastatin  (LIPITOR ) 80 MG tablet   cetirizine (ZYRTEC) 10 MG tablet   EPINEPHrine  0.3 mg/0.3 mL IJ SOAJ injection   ezetimibe  (ZETIA ) 10 MG tablet   folic acid  (FOLVITE ) 1 MG tablet   nitroGLYCERIN  (NITROSTAT ) 0.4 MG SL tablet   Semaglutide-Weight Management (WEGOVY) 0.5 MG/0.5ML SOAJ   warfarin (COUMADIN ) 5 MG tablet   HISTORY: Past Medical History:  Diagnosis Date   1st degree AV block    Acid reflux    Aortic atherosclerosis (  HCC)    Asthma    Bilateral carotid artery stenosis    Carpal tunnel syndrome of left wrist    Centrilobular emphysema (HCC)    Cervical radiculopathy    Coronary artery disease involving native coronary artery of native heart    CVA (cerebral vascular accident)    a.)  LEFT frontal lobe ACA territory infarct noted on CT head 07/20/2022; noted to be new when compared to 09/09/2014 imaging   Hammertoe of left foot 07/2023   Heart failure with recovered ejection fraction (HFrecEF) (HCC)    Hyperlipidemia    Lumbar radiculopathy    Myocardial infarction [facility]) 2007   Osteoarthritis of both knees    PAD (peripheral artery disease) (HCC)    a.) s/p BILATERAL iliac artery stenting 2020; b.) s/p LEFT SFA and LEFT popliteal mechanical thrombectomy with Rotarex 2024   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HCC)    Pre-diabetes    Pulmonary hypertension (HCC)    Rheumatoid arthritis (HCC)    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 Jude mechanical valve   Severe mitral regurgitation    a.) s/p MVR 12/13/2017   Warfarin anticoagulation    Past Surgical History:  Procedure Laterality Date   BREAST BIOPSY Left 01/04/2012   COLONOSCOPY WITH PROPOFOL  N/A 04/30/2021   Procedure: COLONOSCOPY WITH PROPOFOL ;  Surgeon: Maryruth Ole DASEN, MD;  Location: [facility];  Service: Endoscopy;  Laterality: N/A;   COLONOSCOPY WITH PROPOFOL  N/A 08/13/2021   Procedure: COLONOSCOPY WITH PROPOFOL ;  Surgeon: Maryruth Ole DASEN, MD;  Location: [facility];  Service: Endoscopy;  Laterality: N/A;   CORONARY ARTERY BYPASS GRAFT N/A 12/13/2017   Procedure: CORONARY ARTERY BYPASS GRAFT; Location: [facility]; Surgeon: Nancyann Schwab, MD   INCISION AND DRAINAGE ABSCESS Right 09/04/2020   Procedure: INCISION AND DRAINAGE ABSCESS-Perianal;  Surgeon: Rodolph Romano, MD;  Location: [facility] ORS;  Service: General;  Laterality: Right;   LOWER EXTREMITY ANGIOGRAPHY Left 05/28/2015   Procedure: Lower Extremity Angiography;  Surgeon: Selinda GORMAN Gu, MD;  Location: [facility];  Service: Cardiovascular;  Laterality: Left;   LOWER EXTREMITY ANGIOGRAPHY Left 12/28/2021   Procedure: Lower Extremity Angiography;  Surgeon:  Gu Selinda GORMAN, MD;  Location: [facility];  Service: Cardiovascular;  Laterality: Left;   LOWER EXTREMITY ANGIOGRAPHY Left 11/21/2022   Procedure: Lower Extremity Angiography;  Surgeon: Gu Selinda GORMAN, MD;  Location: [facility];  Service: Cardiovascular;  Laterality: Left;   LOWER EXTREMITY ANGIOGRAPHY Left 11/22/2022   Procedure: Lower Extremity Angiography;  Surgeon: Gu Selinda GORMAN, MD;  Location: [facility];  Service: Cardiovascular;  Laterality: Left;   MITRAL VALVE REPLACEMENT N/A 12/13/2017   Procedure: MITRAL VALVE REPLACEMENT; Location: [facility]; Surgeon: Nancyann Schwab, MD   PERIPHERAL VASCULAR CATHETERIZATION Right 07/16/2015   Procedure: Lower Extremity Angiography;  Surgeon: Selinda GORMAN Gu, MD;  Location: [facility];  Service: Cardiovascular;  Laterality: Right;   PERIPHERAL VASCULAR CATHETERIZATION  07/16/2015   Procedure: Lower Extremity Intervention;  Surgeon: Selinda GORMAN Gu, MD;  Location: [facility];  Service: Cardiovascular;;   RIGHT/LEFT HEART CATH AND CORONARY ANGIOGRAPHY Bilateral 11/07/2017   Procedure: RIGHT/LEFT HEART CATH AND CORONARY ANGIOGRAPHY;  Surgeon: Hester Wolm PARAS, MD;  Location: [facility];  Service: Cardiovascular;  Laterality: Bilateral;   RIGHT/LEFT HEART CATH AND CORONARY ANGIOGRAPHY Bilateral 02/24/2023   TEE WITHOUT CARDIOVERSION N/A 11/07/2017   Procedure: TRANSESOPHAGEAL  ECHOCARDIOGRAM (TEE);  Surgeon: Hester Wolm PARAS, MD;  Location: [facility] ORS;  Service: Cardiovascular;  Laterality: N/A;   WISDOM TOOTH EXTRACTION     Family History  Problem Relation Age of Onset   Hypertension Mother    AAA (abdominal aortic aneurysm) Father    Other Father        bowel obstruction   Breast cancer Neg Hx    Social History   Tobacco Use   Smoking status: Former    Current packs/day: 0.00    Average packs/day: 0.5 packs/day for 20.0 years (10.0 ttl pk-yrs)    Types: Cigarettes    Start date: 12/1998    Quit date:  12/2018    Years since quitting: 4.6   Smokeless tobacco: Never  Substance Use Topics   Alcohol use: Yes    Alcohol/week: 3.0 standard drinks of alcohol    Types: 3 Cans of beer per week   LABS:  Hospital Outpatient Visit on 08/09/2023  Component Date Value Ref Range Status   Sodium 08/09/2023 140  135 - 145 mmol/L Final   Potassium 08/09/2023 4.0  3.5 - 5.1 mmol/L Final   Chloride 08/09/2023 106  98 - 111 mmol/L Final   CO2 08/09/2023 27  22 - 32 mmol/L Final   Glucose, Bld 08/09/2023 114 (H)  70 - 99 mg/dL Final   Glucose reference range applies only to samples taken after fasting for at least 8 hours.   BUN 08/09/2023 14  8 - 23 mg/dL Final   Creatinine, Ser 08/09/2023 1.00  0.44 - 1.00 mg/dL Final   Calcium  08/09/2023 9.3  8.9 - 10.3 mg/dL Final   GFR, Estimated 08/09/2023 >60  >60 mL/min Final   Comment: (NOTE) Calculated using the CKD-EPI Creatinine Equation (2021)    Anion gap 08/09/2023 7  5 - 15 Final   Performed at [facility], [address] [place], [place] [address]   WBC 08/09/2023 10.2  4.0 - 10.5 K/uL Final   RBC 08/09/2023 5.20 (H)  3.87 - 5.11 MIL/uL Final   Hemoglobin 08/09/2023 15.1 (H)  12.0 - 15.0 g/dL Final   HCT 91/93/7974 46.6 (H)  36.0 - 46.0 % Final   MCV 08/09/2023 89.6  80.0 - 100.0 fL Final   MCH 08/09/2023 29.0  26.0 - 34.0 pg Final   MCHC 08/09/2023 32.4  30.0 - 36.0 g/dL Final   RDW 91/93/7974 13.0  11.5 - 15.5 % Final   Platelets 08/09/2023 217  150 - 400 K/uL Final   nRBC 08/09/2023 0.0  0.0 - 0.2 % Final   Performed at [facility], [address] [place], [place] [address]    ECG: Date: 08/09/2023  Time ECG obtained: 1129 AM Rate: 59 bpm Rhythm: sinus bradycardia Axis (leads I and aVF): normal Intervals: PR 160 ms. QRS 92 ms. QTc 455 ms. ST segment and T wave changes: Nonspecific inferolateral T wave abnormalities Evidence of a possible, age undetermined, prior infarct: No Comparison: Similar to previous  tracing obtained on 02/24/2023   IMAGING / PROCEDURES: DG FOOT COMPLETE LEFT performed on 05/23/2023 Hammertoe contracture of 2nd, 3rd and 4th digit noted.   Midfoot arthritis noted.   Plantar and posterior heel spurring noted.   No other abnormalities identified   RIGHT/LEFT HEART CATH AND CORONARY ANGIOGRAPHY performed on 02/24/2023 Similar coronary anatomy to 2021.  Patent LCx stent and moderate LIMA-LAD anastomosis stenosis Elevated LV filling pressures and moderately low cardiac index Mean RA = 6 mmHg Mean PA =  32 mmHg Mean PCWP = 16 mmHg PVR = 3.3 Wood units CI = 2.2 L/min/m  VAS US  ABI WITH/WO TBI performed on 01/25/2023 Resting right ankle-brachial index is within normal range. The right toe-brachial index is abnormal.  Resting left ankle-brachial index is within normal range. The left toe-brachial index is abnormal.    TRANSTHORACIC ECHOCARDIOGRAM performed on 09/09/2022 Normal left ventricular systolic function with an EF of >55% Mild concentric LVH Left ventricular diastolic Doppler parameters consistent with pseudonormalization (G2DD). Left atrium mildly enlarged.  Right ventricular size and function normal.  Bioprosthetic mitral valve noted. Trivial aortic, tricuspid, and pulmonary valve regurgitation Mild mitral valve regurgitation Normal gradients; no valvular stenosis  CT HEAD WO CONTRAST performed on 07/20/2022 No evidence of an acute intracranial abnormality. Chronic left frontal lobe ACA territory infarct, new from the prior head CT of 09/09/2014. Partially empty sella turcica. This finding can reflect incidental anatomic variation, or alternatively, it can be associated with idiopathic intracranial hypertension (pseudotumor cerebri).   MYOCARDIAL PERFUSION IMAGING STUDY (LEXISCAN ) performed on 07/03/2019 Mildly reduced left ventricular systolic function with an EF of 45% Hypokinesis of the anterior and inferior segments/walls SPECT images demonstrate a  large reversible perfusion abnormality of moderate intensity present in the apical inferior area and large lateral basal segment on stress images.  Additionally, there were reversible defects in the anterior inferior and lateral basal segments consistent with scar of previous infarct. High risk study.  Recommend invasive evaluation if warranted.   IMPRESSION AND PLAN: Theresa Phelps has been referred for pre-anesthesia review and clearance prior to her undergoing the planned anesthetic and procedural courses. Available labs, pertinent testing, and imaging results were personally reviewed by me in preparation for upcoming operative/procedural course. [facility] medical record has been updated following extensive record review and patient interview with PAT staff.   This patient has been appropriately cleared by cardiology with an overall LOW risk of patient experiencing significant perioperative cardiovascular complications. Based on clinical review performed today (08/11/23), barring any significant acute changes in the patient's overall condition, it is anticipated that she will be able to proceed with the planned surgical intervention. Any acute changes in clinical condition may necessitate her procedure being postponed and/or cancelled. Patient will meet with anesthesia team (MD and/or CRNA) on the day of her procedure for preoperative evaluation/assessment. Questions regarding anesthetic course will be fielded at that time.   Pre-surgical instructions were reviewed with the patient during his PAT appointment, and questions were fielded to satisfaction by PAT clinical staff. She has been instructed on which medications that she will need to hold prior to surgery, as well as the ones that have been deemed safe/appropriate to take on the day of her procedure. As part of the general education provided by PAT, patient made aware both verbally and in writing, that she would need to abstain from the use of  any illegal substances during her perioperative course. She was advised that failure to follow the provided instructions could necessitate case cancellation or result in serious perioperative complications up to and including death. Patient encouraged to contact PAT and/or her surgeon's office to discuss any questions or concerns that may arise prior to surgery; verbalized understanding.   Dorise Pereyra, MSN, APRN, FNP-C, CEN [facility]  Perioperative Services Nurse Practitioner Phone: [phone] Fax: [phone] 08/11/23 2:02 PM  NOTE: This note has been prepared using Dragon dictation software. Despite my best ability to proofread, there is always the potential that unintentional transcriptional  errors may still occur from this process.

## 2023-08-11 NOTE — Progress Notes (Signed)
 Perioperative / Anesthesia Services  Pre-Admission Testing Clinical Review / Pre-Operative Anesthesia Consult  Date: 08/11/23  PATIENT DEMOGRAPHICS: Name: Theresa Phelps DOB: [birth year] MRN:   [id number]  Note: Available PAT nursing documentation and vital signs have been reviewed. Clinical nursing staff has updated patient's PMH/PSHx, current medication list, and drug allergies/intolerances to ensure complete and comprehensive history available to assist care teams in MDM as it pertains to the aforementioned surgical procedure and anticipated anesthetic course. Extensive review of available clinical information personally performed. [facility] and PSH[facility] updated with any diagnoses/procedures that  may have been inadvertently omitted during her intake with the pre-admission testing department's nursing staff.  PLANNED SURGICAL PROCEDURE(S):   Case: [id number] Date/Time: 08/14/23 0815   Procedures:      CORRECTION, HAMMER TOE (Left: Toe) - IV SEDATION     CAPSULOTOMY, MTP JOINT (Left)   Anesthesia type: Monitor Anesthesia Care   Diagnosis:      Hammer toe of left foot [M20.42]     Foot contracture, left [M24.575]   Pre-op diagnosis:      HAMMER TOE OF LEFT FOOT     FOOT CONTRACTURE, LEFT   Location: [facility] OR ROOM 08 / [facility]   Surgeons: Tobie Franky SQUIBB, DPM        CLINICAL DISCUSSION: Theresa Phelps is a 63 y.o. female who is submitted for pre-surgical anesthesia review and clearance prior to her undergoing the above procedure. Patient is a Former Smoker (10 pack years; quit 12/2018). Pertinent PMH includes: CAD s/p CABG), MI, severe mitral valve prolapse and regurgitation (s/p MVR), HFrecEF, pulmonary hypertension, atrial fibrillation, CVA PAD, BILATERAL carotid artery stenosis, aortic atherosclerosis, first-degree AV block, HTN, HLD, prediabetes, pulmonary emphysema, asthma, GERD (no daily Tx), RA, OA, cervical and lumbar radiculopathy, hammertoe of LEFT  foot with contracture.  Patient is followed by cardiology Philippe, MD / Joshua, MD). She was last seen in the cardiology clinic on 05/18/2023; notes reviewed. At the time of her clinic visit, patient doing well overall from a cardiovascular perspective.  Patient with reports of chronic exertional dyspnea that was reported to be limiting her distance ambulation abilities over the last several months.  Patient also complaining of chronic leg pain associated with known neurogenic claudication.  She advises that pain had improved some with recent revascularization.  Patient denied any chest pain, shortness of breath, PND, orthopnea, palpitations, significant peripheral edema, weakness, fatigue, vertiginous symptoms, or presyncope/syncope. Patient with a past medical history significant for cardiovascular diagnoses. Documented physical exam was grossly benign, providing no evidence of acute exacerbation and/or decompensation of the patient's known cardiovascular conditions.  Of note, complete records regarding his cardiovascular history unavailable for review at time of consult.  Information gathered from patient report and from notes provided by her local specialty care providers.  Patient reported to have suffered an MI back in 2007.  Unclear as to what type of cardiovascular insult this was.  Unclear if PCI was performed.  Patient underwent diagnostic RIGHT/LEFT heart catheterization on 11/07/2017.  Study revealed multivessel CAD; 85% proximal-mid RCA, 85% ostial OM1, 80% OM1, 20% proximal-mid LCx, 40% proximal LAD, and 70% mid LAD.  Hemodynamics: mean PA = 52 mmHg, mean PCWP = 23 mmHg, AO saturation 87%, CO = 3.87 L/min, and CI = 2.01 L/min/m.  Findings consistent with severe pulmonary hypertension, which is an old diagnosis for this patient.  Patient with severe mitral valve regurgitation.  Given her valvular disease, and the degree/complexity of her coronary  artery disease, patient was referred to CVTS for  consideration of revascularization.  Patient underwent a two-vessel revascularization (CABG) on 12/13/2017.  LIMA-LAD and SVG-RCA bypass grafts were placed.  Additionally, during this procedure, patient underwent mitral valve replacement using a 29 mm St. Jude's mechanical valve.  Patient with a history of significant peripheral artery disease.  She underwent stenting of her bilateral iliac arteries in 2020.  Additionally, patient has undergone mechanical thrombectomy with Rotarex to the LEFT SFA and popliteal arteries in 2024.  CT imaging of the head performed on 07/20/2022 revealed a LEFT frontal lobe ACA territory infarct.  Radiology noted that this finding was new when compared to previous CT imaging of the head performed on 09/09/2014.  Patient with no significant appreciable deficits following neurological event.  Most recent TTE performed on 09/09/2022 revealed a normal left ventricular systolic function with an EF of >55%. There was mild concentric LVH.  There were no regional wall motion abnormalities. Left ventricular diastolic Doppler parameters consistent with pseudonormalization (G2DD).  Left ventricular GLS -13.0%.  Left atrium was mildly enlarged. Right ventricular size and function normal with a TAPSE measuring 0.90 cm  (normal range >/= 1.6 cm).  Mechanical mitral valve noted to be well-seated and functioning properly.  There was trivial to mild pan valvular regurgitation.  All transvalvular gradients were noted to be normal providing no evidence of hemodynamically significant valvular stenosis. Aorta normal in size with no evidence of ectasia or aneurysmal dilatation.  Most recent myocardial perfusion imaging study was performed on 09/09/2022 revealing a mildly decreased left ventricular systolic function with an EF of 45%.  Anterior and inferior segments/wall hypokinesis observed.  No artifact or left ventricular cavity size enlargement appreciated on review of imaging. SPECT images  demonstrated a large reversible perfusion abnormality of moderate intensity in the apical inferior and large lateral basal segments region on stress images.  Additionally, there were reversible defects in the anterior wall to a moderate degree.  There were apical inferior lateral basal segment defects consistent with scar or previous infarct.  TID ratio = 1.19.  Findings consistent with ischemia.  Study determined to be high risk and invasive evaluation recommended.  Repeat diagnostic RIGHT/LEFT heart catheterization was performed on 02/24/2023.  Study demonstrated similar coronary anatomy when compared to previous study performed back in 2021.  Previously placed stent to the LCx and moderate LIMA-LAD anastomosis stenosis was noted.  Hemodynamics revealed elevated left ventricular filling pressures and moderately low cardiac index.  Findings consistent with normal pulmonary hypertension.  Patient with an atrial fibrillation diagnosis; CHA2DS2-VASc Score = 6 (sex, HFpEF, HTN, CVA x 2, prior MI/vascular disease). Her rate and rhythm are currently being maintained intrinsically without the need for pharmacological intervention.  Given patient's mechanical valve and atrial fibrillation/flutter history, she is on daily anticoagulation therapy using warfarin.  Patient reportedly compliant with therapy with no evidence or reports of GI/GU related bleeding.  Blood pressure on the lower side at 94/60 mmHg on currently prescribed CCB (amlodipine ) monotherapy.  Patient is on atorvastatin  + ezetimibe  for her HLD diagnosis and further ASCVD prevention.  Patient has a supply of short acting nitrates (NTG) to use on a as needed basis for recurrent angina/anginal equivalent symptoms; denied recent use.  Patient has a prediabetes diagnosis that she is managing with diet lifestyle modification alone.  Most recent hemoglobin A1c was 6.4% when checked on 11/29/2022. Patient does not have an OSAH diagnosis.  Functional capacity  limited by patient's age and multiple medical comorbidities.  Over the past several months, patient has noted increased dyspnea with short distance ambulation.  This is limiting her ability to complete her ADLs/IADLs.  Per the [facility], patient questionably able to achieve 4 METS physical activity without experiencing at least to some degree, significant angina/anginal equivalent symptoms.  No changes were made to her medication regimen.  Patient follow-up with outpatient cardiology in 6 months or sooner if needed.  Theresa Phelps is scheduled for an elective CORRECTION, HAMMER TOE (Left: Toe); CAPSULOTOMY, MTP JOINT (Left) on 08/14/2023 with Dr. Franky Blanch, DPM.  Given patient's past medical history significant for cardiovascular diagnoses, presurgical cardiac clearance was sought by the PAT team. Per cardiology, this patient is optimized for surgery and may proceed with the planned procedural course with a ACCEPTABLE risk of significant perioperative cardiovascular complications.  Again, this patient is on daily oral anticoagulation therapy. She has been instructed on recommendations for holding her warfarin for 5 days prior to her procedure with plans to restart as soon as postoperative bleeding risk felt to be minimized by his primary attending surgeon. The patient has been instructed that her last dose of should be on 08/08/2023.  Conflicting information received regarding need for perioperative enoxaparin bridging.  Patient adamant that cardiology advised her that no bridging would be required.  PCP notes indicate the same.  Signed clearance form from cardiology indicates that patient will need bridging.  He had follow-up conversation with the office, it was determined that anticoagulation clinic would contact patient to facilitate.  Patient denies previous perioperative complications with anesthesia in the past. In review her EMR, it is noted that patient underwent a general anesthetic course here at  [facility] (ASA [facility]) in 08/2021 without documented complications.   MOST RECENT VITAL SIGNS:    08/08/2023    3:27 PM 01/25/2023    1:12 PM 12/13/2022    2:00 PM  Vitals with BMI  Height 5' 10 5' 10   Weight 230 lbs 229 lbs 225 lbs 3 oz  BMI 33 32.86 32.31  Systolic  115 120  Diastolic  73 70  Pulse  60 55   PROVIDERS/SPECIALISTS: NOTE: Primary physician provider listed below. Patient may have been seen by APP or partner within same practice.   PROVIDER ROLE / SPECIALTY LAST OV  Blanch Franky SQUIBB, DPM Podiatry (Surgeon) 05/23/2023  [facility] Primary Care Provider 08/07/2022  Florencio Shine, MD Cardiology 03/24/2023  Joshua Batter, MD Cardiology 05/18/2023   ALLERGIES: Allergies  Allergen Reactions   Shellfish Allergy Anaphylaxis   Banana Other (See Comments)    Burning in the mouth   Garlic Other (See Comments)    Pt states arms freeze up and can't talk but no SOB   Isosorbide Nitrate Itching    Blurred vision   Fish-Derived Products Rash    CURRENT HOME MEDICATIONS: No current facility-administered medications for this encounter.    acetaminophen  (TYLENOL ) 500 MG tablet   amLODipine  (NORVASC ) 5 MG tablet   atorvastatin  (LIPITOR ) 80 MG tablet   cetirizine (ZYRTEC) 10 MG tablet   EPINEPHrine  0.3 mg/0.3 mL IJ SOAJ injection   ezetimibe  (ZETIA ) 10 MG tablet   folic acid  (FOLVITE ) 1 MG tablet   nitroGLYCERIN  (NITROSTAT ) 0.4 MG SL tablet   Semaglutide-Weight Management (WEGOVY) 0.5 MG/0.5ML SOAJ   warfarin (COUMADIN ) 5 MG tablet   HISTORY: Past Medical History:  Diagnosis Date   1st degree AV block    Acid reflux    Aortic atherosclerosis (  HCC)    Asthma    Bilateral carotid artery stenosis    Carpal tunnel syndrome of left wrist    Centrilobular emphysema (HCC)    Cervical radiculopathy    Coronary artery disease involving native coronary artery of native heart    CVA (cerebral vascular accident)    a.)  LEFT frontal lobe ACA territory infarct noted on CT head 07/20/2022; noted to be new when compared to 09/09/2014 imaging   Hammertoe of left foot 07/2023   Heart failure with recovered ejection fraction (HFrecEF) (HCC)    Hyperlipidemia    Lumbar radiculopathy    Myocardial infarction [facility]) 2007   Osteoarthritis of both knees    PAD (peripheral artery disease) (HCC)    a.) s/p BILATERAL iliac artery stenting 2020; b.) s/p LEFT SFA and LEFT popliteal mechanical thrombectomy with Rotarex 2024   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HCC)    Pre-diabetes    Pulmonary hypertension (HCC)    Rheumatoid arthritis (HCC)    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 Jude mechanical valve   Severe mitral regurgitation    a.) s/p MVR 12/13/2017   Warfarin anticoagulation    Past Surgical History:  Procedure Laterality Date   BREAST BIOPSY Left 01/04/2012   COLONOSCOPY WITH PROPOFOL  N/A 04/30/2021   Procedure: COLONOSCOPY WITH PROPOFOL ;  Surgeon: Maryruth Ole DASEN, MD;  Location: [facility] ENDOSCOPY;  Service: Endoscopy;  Laterality: N/A;   COLONOSCOPY WITH PROPOFOL  N/A 08/13/2021   Procedure: COLONOSCOPY WITH PROPOFOL ;  Surgeon: Maryruth Ole DASEN, MD;  Location: [facility] END[facility];  Service: Endoscopy;  Laterality: N/A;   CORONARY ARTERY BYPASS GRAFT N/A 12/13/2017   Procedure: CORONARY ARTERY BYPASS GRAFT; Location: [facility]; Surgeon: Nancyann Schwab, MD   INCISION AND DRAINAGE ABSCESS Right 09/04/2020   Procedure: INCISION AND DRAINAGE ABSCESS-Perianal;  Surgeon: Rodolph Romano, MD;  Location: [facility] ORS;  Service: General;  Laterality: Right;   LOWER EXTREMITY ANGIOGRAPHY Left 05/28/2015   Procedure: Lower Extremity Angiography;  Surgeon: Selinda GORMAN Gu, MD;  Location: [facility];  Service: Cardiovascular;  Laterality: Left;   LOWER EXTREMITY ANGIOGRAPHY Left 12/28/2021   Procedure: Lower Extremity Angiography;  Surgeon:  Gu Selinda GORMAN, MD;  Location: [facility];  Service: Cardiovascular;  Laterality: Left;   LOWER EXTREMITY ANGIOGRAPHY Left 11/21/2022   Procedure: Lower Extremity Angiography;  Surgeon: Gu Selinda GORMAN, MD;  Location: [facility];  Service: Cardiovascular;  Laterality: Left;   LOWER EXTREMITY ANGIOGRAPHY Left 11/22/2022   Procedure: Lower Extremity Angiography;  Surgeon: Gu Selinda GORMAN, MD;  Location: [facility];  Service: Cardiovascular;  Laterality: Left;   MITRAL VALVE REPLACEMENT N/A 12/13/2017   Procedure: MITRAL VALVE REPLACEMENT; Location: [facility]; Surgeon: Nancyann Schwab, MD   PERIPHERAL VASCULAR CATHETERIZATION Right 07/16/2015   Procedure: Lower Extremity Angiography;  Surgeon: Selinda GORMAN Gu, MD;  Location: [facility];  Service: Cardiovascular;  Laterality: Right;   PERIPHERAL VASCULAR CATHETERIZATION  07/16/2015   Procedure: Lower Extremity Intervention;  Surgeon: Selinda GORMAN Gu, MD;  Location: [facility];  Service: Cardiovascular;;   RIGHT/LEFT HEART CATH AND CORONARY ANGIOGRAPHY Bilateral 11/07/2017   Procedure: RIGHT/LEFT HEART CATH AND CORONARY ANGIOGRAPHY;  Surgeon: Hester Wolm PARAS, MD;  Location: [facility];  Service: Cardiovascular;  Laterality: Bilateral;   RIGHT/LEFT HEART CATH AND CORONARY ANGIOGRAPHY Bilateral 02/24/2023   TEE WITHOUT CARDIOVERSION N/A 11/07/2017   Procedure: TRANSESOPHAGEAL  ECHOCARDIOGRAM (TEE);  Surgeon: Hester Wolm PARAS, MD;  Location: [facility] ORS;  Service: Cardiovascular;  Laterality: N/A;   WISDOM TOOTH EXTRACTION     Family History  Problem Relation Age of Onset   Hypertension Mother    AAA (abdominal aortic aneurysm) Father    Other Father        bowel obstruction   Breast cancer Neg Hx    Social History   Tobacco Use   Smoking status: Former    Current packs/day: 0.00    Average packs/day: 0.5 packs/day for 20.0 years (10.0 ttl pk-yrs)    Types: Cigarettes    Start date: 12/1998    Quit date:  12/2018    Years since quitting: 4.6   Smokeless tobacco: Never  Substance Use Topics   Alcohol use: Yes    Alcohol/week: 3.0 standard drinks of alcohol    Types: 3 Cans of beer per week   LABS:  Hospital Outpatient Visit on 08/09/2023  Component Date Value Ref Range Status   Sodium 08/09/2023 140  135 - 145 mmol/L Final   Potassium 08/09/2023 4.0  3.5 - 5.1 mmol/L Final   Chloride 08/09/2023 106  98 - 111 mmol/L Final   CO2 08/09/2023 27  22 - 32 mmol/L Final   Glucose, Bld 08/09/2023 114 (H)  70 - 99 mg/dL Final   Glucose reference range applies only to samples taken after fasting for at least 8 hours.   BUN 08/09/2023 14  8 - 23 mg/dL Final   Creatinine, Ser 08/09/2023 1.00  0.44 - 1.00 mg/dL Final   Calcium  08/09/2023 9.3  8.9 - 10.3 mg/dL Final   GFR, Estimated 08/09/2023 >60  >60 mL/min Final   Comment: (NOTE) Calculated using the CKD-EPI Creatinine Equation (2021)    Anion gap 08/09/2023 7  5 - 15 Final   Performed at [facility], [address] [place], [place] [address]   WBC 08/09/2023 10.2  4.0 - 10.5 K/uL Final   RBC 08/09/2023 5.20 (H)  3.87 - 5.11 MIL/uL Final   Hemoglobin 08/09/2023 15.1 (H)  12.0 - 15.0 g/dL Final   HCT 91/93/7974 46.6 (H)  36.0 - 46.0 % Final   MCV 08/09/2023 89.6  80.0 - 100.0 fL Final   MCH 08/09/2023 29.0  26.0 - 34.0 pg Final   MCHC 08/09/2023 32.4  30.0 - 36.0 g/dL Final   RDW 91/93/7974 13.0  11.5 - 15.5 % Final   Platelets 08/09/2023 217  150 - 400 K/uL Final   nRBC 08/09/2023 0.0  0.0 - 0.2 % Final   Performed at [facility], [address] [place], [place] [address]    ECG: Date: 08/09/2023  Time ECG obtained: 1129 AM Rate: 59 bpm Rhythm: sinus bradycardia Axis (leads I and aVF): normal Intervals: PR 160 ms. QRS 92 ms. QTc 455 ms. ST segment and T wave changes: Nonspecific inferolateral T wave abnormalities Evidence of a possible, age undetermined, prior infarct: No Comparison: Similar to previous  tracing obtained on 02/24/2023   IMAGING / PROCEDURES: DG FOOT COMPLETE LEFT performed on 05/23/2023 Hammertoe contracture of 2nd, 3rd and 4th digit noted.   Midfoot arthritis noted.   Plantar and posterior heel spurring noted.   No other abnormalities identified   RIGHT/LEFT HEART CATH AND CORONARY ANGIOGRAPHY performed on 02/24/2023 Similar coronary anatomy to 2021.  Patent LCx stent and moderate LIMA-LAD anastomosis stenosis Elevated LV filling pressures and moderately low cardiac index Mean RA = 6 mmHg Mean PA =  32 mmHg Mean PCWP = 16 mmHg PVR = 3.3 Wood units CI = 2.2 L/min/m  VAS US  ABI WITH/WO TBI performed on 01/25/2023 Resting right ankle-brachial index is within normal range. The right toe-brachial index is abnormal.  Resting left ankle-brachial index is within normal range. The left toe-brachial index is abnormal.    TRANSTHORACIC ECHOCARDIOGRAM performed on 09/09/2022 Normal left ventricular systolic function with an EF of >55% Mild concentric LVH Left ventricular diastolic Doppler parameters consistent with pseudonormalization (G2DD). Left atrium mildly enlarged.  Right ventricular size and function normal.  Bioprosthetic mitral valve noted. Trivial aortic, tricuspid, and pulmonary valve regurgitation Mild mitral valve regurgitation Normal gradients; no valvular stenosis  CT HEAD WO CONTRAST performed on 07/20/2022 No evidence of an acute intracranial abnormality. Chronic left frontal lobe ACA territory infarct, new from the prior head CT of 09/09/2014. Partially empty sella turcica. This finding can reflect incidental anatomic variation, or alternatively, it can be associated with idiopathic intracranial hypertension (pseudotumor cerebri).   MYOCARDIAL PERFUSION IMAGING STUDY (LEXISCAN ) performed on 07/03/2019 Mildly reduced left ventricular systolic function with an EF of 45% Hypokinesis of the anterior and inferior segments/walls SPECT images demonstrate a  large reversible perfusion abnormality of moderate intensity present in the apical inferior area and large lateral basal segment on stress images.  Additionally, there were reversible defects in the anterior inferior and lateral basal segments consistent with scar of previous infarct. High risk study.  Recommend invasive evaluation if warranted.   IMPRESSION AND PLAN: Theresa Phelps has been referred for pre-anesthesia review and clearance prior to her undergoing the planned anesthetic and procedural courses. Available labs, pertinent testing, and imaging results were personally reviewed by me in preparation for upcoming operative/procedural course. [facility] medical record has been updated following extensive record review and patient interview with PAT staff.   This patient has been appropriately cleared by cardiology with an overall LOW risk of patient experiencing significant perioperative cardiovascular complications. Based on clinical review performed today (08/11/23), barring any significant acute changes in the patient's overall condition, it is anticipated that she will be able to proceed with the planned surgical intervention. Any acute changes in clinical condition may necessitate her procedure being postponed and/or cancelled. Patient will meet with anesthesia team (MD and/or CRNA) on the day of her procedure for preoperative evaluation/assessment. Questions regarding anesthetic course will be fielded at that time.   Pre-surgical instructions were reviewed with the patient during his PAT appointment, and questions were fielded to satisfaction by PAT clinical staff. She has been instructed on which medications that she will need to hold prior to surgery, as well as the ones that have been deemed safe/appropriate to take on the day of her procedure. As part of the general education provided by PAT, patient made aware both verbally and in writing, that she would need to abstain from the use of  any illegal substances during her perioperative course. She was advised that failure to follow the provided instructions could necessitate case cancellation or result in serious perioperative complications up to and including death. Patient encouraged to contact PAT and/or her surgeon's office to discuss any questions or concerns that may arise prior to surgery; verbalized understanding.   Dorise Pereyra, MSN, APRN, FNP-C, CEN [facility]  Perioperative Services Nurse Practitioner Phone: [phone] Fax: [phone] 08/11/23 2:02 PM  NOTE: This note has been prepared using Dragon dictation software. Despite my best ability to proofread, there is always the potential that unintentional transcriptional  errors may still occur from this process.

## 2023-08-11 NOTE — Telephone Encounter (Signed)
 DOS- 08/14/2023  LT HAMMERTOE REPAIR- [id number] LT (2-4TH) CAPSULOTOMY MPJ RELEASE JOINT- [id number]  [employer] EFFECTIVE DATE- 10/03/2020  COINSURANCE- $4 COPAY NO COINS  PER FAX RECEIVED FROM [employer], NO PRIOR AUTHORIZATION REQUIRED FOR CPT CODE [id number] AFTER A PEER TO PEER, AUTHORIZATION HAS BEEN APPROVED FOR CPT CODE [phone]. AUTH# [id number] 08/11/2023 DOCUMENTATION ATTACHED TO SURGERY CONSENT PACKET.

## 2023-08-14 ENCOUNTER — Other Ambulatory Visit: Payer: Self-pay

## 2023-08-14 ENCOUNTER — Ambulatory Visit: Payer: Self-pay | Admitting: Urgent Care

## 2023-08-14 ENCOUNTER — Other Ambulatory Visit: Payer: Self-pay | Admitting: Podiatry

## 2023-08-14 ENCOUNTER — Encounter: Payer: Self-pay | Admitting: Podiatry

## 2023-08-14 ENCOUNTER — Encounter
Admission: RE | Disposition: A | Discharge status: Home / Self Care | Payer: Self-pay | Source: Home / Self Care | Attending: Podiatry

## 2023-08-14 ENCOUNTER — Ambulatory Visit
Admission: RE | Admit: 2023-08-14 | Discharge: 2023-08-14 | Disposition: A | Discharge status: Home / Self Care | Attending: Podiatry | Admitting: Podiatry

## 2023-08-14 ENCOUNTER — Ambulatory Visit

## 2023-08-14 DIAGNOSIS — Z01812 Encounter for preprocedural laboratory examination: Secondary | ICD-10-CM

## 2023-08-14 DIAGNOSIS — M24575 Contracture, left foot: Secondary | ICD-10-CM | POA: Diagnosis present

## 2023-08-14 DIAGNOSIS — J4489 Other specified chronic obstructive pulmonary disease: Secondary | ICD-10-CM | POA: Diagnosis not present

## 2023-08-14 DIAGNOSIS — I272 Pulmonary hypertension, unspecified: Secondary | ICD-10-CM | POA: Diagnosis not present

## 2023-08-14 DIAGNOSIS — Z952 Presence of prosthetic heart valve: Secondary | ICD-10-CM | POA: Insufficient documentation

## 2023-08-14 DIAGNOSIS — K219 Gastro-esophageal reflux disease without esophagitis: Secondary | ICD-10-CM | POA: Insufficient documentation

## 2023-08-14 DIAGNOSIS — Z79899 Other long term (current) drug therapy: Secondary | ICD-10-CM | POA: Diagnosis not present

## 2023-08-14 DIAGNOSIS — M2042 Other hammer toe(s) (acquired), left foot: Secondary | ICD-10-CM | POA: Diagnosis present

## 2023-08-14 DIAGNOSIS — Z955 Presence of coronary angioplasty implant and graft: Secondary | ICD-10-CM | POA: Insufficient documentation

## 2023-08-14 DIAGNOSIS — I739 Peripheral vascular disease, unspecified: Secondary | ICD-10-CM | POA: Insufficient documentation

## 2023-08-14 DIAGNOSIS — Z7901 Long term (current) use of anticoagulants: Secondary | ICD-10-CM | POA: Insufficient documentation

## 2023-08-14 DIAGNOSIS — I5032 Chronic diastolic (congestive) heart failure: Secondary | ICD-10-CM | POA: Diagnosis not present

## 2023-08-14 DIAGNOSIS — I252 Old myocardial infarction: Secondary | ICD-10-CM | POA: Insufficient documentation

## 2023-08-14 DIAGNOSIS — Z951 Presence of aortocoronary bypass graft: Secondary | ICD-10-CM | POA: Diagnosis not present

## 2023-08-14 DIAGNOSIS — M7752 Other enthesopathy of left foot: Secondary | ICD-10-CM | POA: Diagnosis not present

## 2023-08-14 DIAGNOSIS — M069 Rheumatoid arthritis, unspecified: Secondary | ICD-10-CM | POA: Diagnosis not present

## 2023-08-14 DIAGNOSIS — Z87891 Personal history of nicotine dependence: Secondary | ICD-10-CM | POA: Insufficient documentation

## 2023-08-14 DIAGNOSIS — I4891 Unspecified atrial fibrillation: Secondary | ICD-10-CM | POA: Insufficient documentation

## 2023-08-14 DIAGNOSIS — I2510 Atherosclerotic heart disease of native coronary artery without angina pectoris: Secondary | ICD-10-CM | POA: Diagnosis not present

## 2023-08-14 DIAGNOSIS — E785 Hyperlipidemia, unspecified: Secondary | ICD-10-CM | POA: Insufficient documentation

## 2023-08-14 DIAGNOSIS — I110 Hypertensive heart disease with heart failure: Secondary | ICD-10-CM | POA: Diagnosis not present

## 2023-08-14 HISTORY — DX: Chronic diastolic (congestive) heart failure: I50.32

## 2023-08-14 HISTORY — DX: Unspecified atrial fibrillation: I48.91

## 2023-08-14 HISTORY — PX: CAPSULOTOMY METATARSOPHALANGEAL: SHX6614

## 2023-08-14 HISTORY — DX: Atherosclerosis of aorta: I70.0

## 2023-08-14 HISTORY — DX: Cerebral infarction, unspecified: I63.9

## 2023-08-14 HISTORY — PX: HAMMER TOE SURGERY: SHX385

## 2023-08-14 LAB — PROTIME-INR
INR: 1.1 (ref 0.8–1.2)
Prothrombin Time: 14.5 s (ref 11.4–15.2)

## 2023-08-14 SURGERY — CORRECTION, HAMMER TOE
Anesthesia: General | Site: Toe | Laterality: Left

## 2023-08-14 MED ORDER — CEFAZOLIN SODIUM-DEXTROSE 2-4 GM/100ML-% IV SOLN
2.0000 g | Freq: Once | INTRAVENOUS | Status: AC
  Administered 2023-08-14 (×2): 2 g via INTRAVENOUS

## 2023-08-14 MED ORDER — OXYCODONE HCL 5 MG PO TABS
ORAL_TABLET | ORAL | Status: AC
  Filled 2023-08-14: qty 1

## 2023-08-14 MED ORDER — FENTANYL CITRATE (PF) 100 MCG/2ML IJ SOLN
INTRAMUSCULAR | Status: AC
  Filled 2023-08-14: qty 2

## 2023-08-14 MED ORDER — CHLORHEXIDINE GLUCONATE 0.12 % MT SOLN
15.0000 mL | Freq: Once | OROMUCOSAL | Status: AC
  Administered 2023-08-14 (×2): 15 mL via OROMUCOSAL

## 2023-08-14 MED ORDER — MIDAZOLAM HCL 2 MG/2ML IJ SOLN
INTRAMUSCULAR | Status: AC
  Filled 2023-08-14: qty 2

## 2023-08-14 MED ORDER — ORAL CARE MOUTH RINSE: 15.0000 mL | Freq: Once | OROMUCOSAL | Status: AC

## 2023-08-14 MED ORDER — KETOROLAC TROMETHAMINE 30 MG/ML IJ SOLN
INTRAMUSCULAR | Status: AC
  Filled 2023-08-14: qty 1

## 2023-08-14 MED ORDER — IBUPROFEN 800 MG PO TABS: 800.0000 mg | ORAL_TABLET | Freq: Four times a day (QID) | ORAL | 1 refills | Status: AC | PRN

## 2023-08-14 MED ORDER — CEFAZOLIN SODIUM-DEXTROSE 2-4 GM/100ML-% IV SOLN
INTRAVENOUS | Status: AC
  Filled 2023-08-14: qty 100

## 2023-08-14 MED ORDER — OXYCODONE HCL 5 MG/5ML PO SOLN: 5.0000 mg | Freq: Once | ORAL | Status: AC | PRN

## 2023-08-14 MED ORDER — 0.9 % SODIUM CHLORIDE (POUR BTL) OPTIME
TOPICAL | Status: DC | PRN
  Administered 2023-08-14 (×2): 500 mL

## 2023-08-14 MED ORDER — FENTANYL CITRATE (PF) 100 MCG/2ML IJ SOLN
INTRAMUSCULAR | Status: AC
Start: 2023-08-14 — End: 2023-08-14
  Filled 2023-08-14: qty 2

## 2023-08-14 MED ORDER — OXYCODONE-ACETAMINOPHEN 5-325 MG PO TABS: 1.0000 | ORAL_TABLET | ORAL | 0 refills | Status: AC | PRN

## 2023-08-14 MED ORDER — LIDOCAINE HCL (CARDIAC) PF 100 MG/5ML IV SOSY
PREFILLED_SYRINGE | INTRAVENOUS | Status: DC | PRN
Start: 2023-08-14 — End: 2023-08-14
  Administered 2023-08-14 (×2): 100 mg via INTRAVENOUS

## 2023-08-14 MED ORDER — BUPIVACAINE HCL (PF) 0.5 % IJ SOLN
INTRAMUSCULAR | Status: DC | PRN
  Administered 2023-08-14 (×2): 10 mL

## 2023-08-14 MED ORDER — PROPOFOL 1000 MG/100ML IV EMUL
INTRAVENOUS | Status: AC
  Filled 2023-08-14: qty 100

## 2023-08-14 MED ORDER — LACTATED RINGERS IV SOLN: INTRAVENOUS | Status: DC

## 2023-08-14 MED ORDER — CHLORHEXIDINE GLUCONATE 0.12 % MT SOLN
OROMUCOSAL | Status: AC
  Filled 2023-08-14: qty 15

## 2023-08-14 MED ORDER — OXYCODONE HCL 5 MG PO TABS
5.0000 mg | ORAL_TABLET | Freq: Once | ORAL | Status: AC | PRN
  Administered 2023-08-14 (×2): 5 mg via ORAL

## 2023-08-14 MED ORDER — LIDOCAINE HCL (PF) 2 % IJ SOLN
INTRAMUSCULAR | Status: AC
  Filled 2023-08-14: qty 5

## 2023-08-14 MED ORDER — PROPOFOL 500 MG/50ML IV EMUL
INTRAVENOUS | Status: DC | PRN
  Administered 2023-08-14 (×2): 125 ug/kg/min via INTRAVENOUS

## 2023-08-14 MED ORDER — MIDAZOLAM HCL 2 MG/2ML IJ SOLN
INTRAMUSCULAR | Status: DC | PRN
Start: 2023-08-14 — End: 2023-08-14
  Administered 2023-08-14 (×2): 2 mg via INTRAVENOUS

## 2023-08-14 MED ORDER — KETOROLAC TROMETHAMINE 30 MG/ML IJ SOLN
INTRAMUSCULAR | Status: DC | PRN
  Administered 2023-08-14 (×2): 30 mg via INTRAVENOUS

## 2023-08-14 MED ORDER — ONDANSETRON HCL 4 MG/2ML IJ SOLN
INTRAMUSCULAR | Status: DC | PRN
  Administered 2023-08-14 (×2): 4 mg via INTRAVENOUS

## 2023-08-14 MED ORDER — FENTANYL CITRATE (PF) 100 MCG/2ML IJ SOLN
25.0000 ug | INTRAMUSCULAR | Status: DC | PRN
  Administered 2023-08-14 (×8): 25 ug via INTRAVENOUS

## 2023-08-14 MED ORDER — BUPIVACAINE HCL (PF) 0.5 % IJ SOLN
INTRAMUSCULAR | Status: AC
  Filled 2023-08-14: qty 30

## 2023-08-14 MED ORDER — ACETAMINOPHEN 10 MG/ML IV SOLN
INTRAVENOUS | Status: DC | PRN
  Administered 2023-08-14 (×2): 1000 mg via INTRAVENOUS

## 2023-08-14 MED ORDER — ONDANSETRON HCL 4 MG/2ML IJ SOLN
INTRAMUSCULAR | Status: AC
  Filled 2023-08-14: qty 2

## 2023-08-14 MED ORDER — ACETAMINOPHEN 10 MG/ML IV SOLN
INTRAVENOUS | Status: AC
  Filled 2023-08-14: qty 100

## 2023-08-14 SURGICAL SUPPLY — 42 items
BLADE OSC/SAGITTAL 5.5X25 (BLADE) IMPLANT
BLADE SURG 15 STRL LF DISP TIS (BLADE) ×4 IMPLANT
BLADE SURG MINI STRL (BLADE) IMPLANT
BNDG ELASTIC 4X5.8 VLCR NS LF (GAUZE/BANDAGES/DRESSINGS) ×2 IMPLANT
BNDG ESMARCH 4X12 STRL LF (GAUZE/BANDAGES/DRESSINGS) ×2 IMPLANT
BNDG GAUZE DERMACEA FLUFF 4 (GAUZE/BANDAGES/DRESSINGS) ×2 IMPLANT
CHLORAPREP W/TINT 26 (MISCELLANEOUS) ×2 IMPLANT
COVER PIN YLW 0.028-062 (MISCELLANEOUS) IMPLANT
CUFF TOURN SGL QUICK 12 (TOURNIQUET CUFF) IMPLANT
CUFF TOURN SGL QUICK 18X4 (TOURNIQUET CUFF) IMPLANT
DRAPE FLUOR MINI C-ARM 54X84 (DRAPES) IMPLANT
ELECTRODE BLDE 4.0 EZ CLN MEGD (MISCELLANEOUS) ×2 IMPLANT
ELECTRODE REM PT RTRN 9FT ADLT (ELECTROSURGICAL) ×2 IMPLANT
GAUZE SPONGE 4X4 12PLY STRL (GAUZE/BANDAGES/DRESSINGS) ×2 IMPLANT
GAUZE XEROFORM 1X8 LF (GAUZE/BANDAGES/DRESSINGS) ×2 IMPLANT
GLOVE BIO SURGEON STRL SZ7.5 (GLOVE) ×2 IMPLANT
GLOVE BIOGEL PI IND STRL 7.0 (GLOVE) ×2 IMPLANT
GLOVE SURG SYN 7.0 PF PI (GLOVE) ×2 IMPLANT
GOWN STRL REUS W/ TWL XL LVL3 (GOWN DISPOSABLE) ×4 IMPLANT
KIT TURNOVER KIT A (KITS) ×2 IMPLANT
LABEL OR SOLS (LABEL) ×2 IMPLANT
MANIFOLD NEPTUNE II (INSTRUMENTS) ×2 IMPLANT
NDL FILTER BLUNT 18X1 1/2 (NEEDLE) ×2 IMPLANT
NDL HYPO 25X1 1.5 SAFETY (NEEDLE) ×6 IMPLANT
NEEDLE FILTER BLUNT 18X1 1/2 (NEEDLE) ×2 IMPLANT
NEEDLE HYPO 25X1 1.5 SAFETY (NEEDLE) ×2 IMPLANT
NS IRRIG 500ML POUR BTL (IV SOLUTION) ×2 IMPLANT
PACK EXTREMITY ARMC (MISCELLANEOUS) ×2 IMPLANT
PAD ABD DERMACEA PRESS 5X9 (GAUZE/BANDAGES/DRESSINGS) ×2 IMPLANT
PAD CAST 4YDX4 CTTN HI CHSV (CAST SUPPLIES) ×2 IMPLANT
PENCIL SMOKE EVACUATOR (MISCELLANEOUS) ×2 IMPLANT
STOCKINETTE M/LG 89821 (MISCELLANEOUS) ×2 IMPLANT
SUT MNCRL AB 3-0 PS2 27 (SUTURE) ×2 IMPLANT
SUT MNCRL AB 4-0 PS2 18 (SUTURE) ×2 IMPLANT
SUT MNCRL+ 5-0 UNDYED PC-3 (SUTURE) ×2 IMPLANT
SUT PROLENE 3 0 FS 2 (SUTURE) IMPLANT
SUT PROLENE 4 0 PS 2 18 (SUTURE) IMPLANT
SYR 10ML LL (SYRINGE) ×4 IMPLANT
TRAP FLUID SMOKE EVACUATOR (MISCELLANEOUS) ×2 IMPLANT
WATER STERILE IRR 500ML POUR (IV SOLUTION) ×2 IMPLANT
WIRE Z .045 C-WIRE SPADE TIP (WIRE) IMPLANT
WIRE Z .062 C-WIRE SPADE TIP (WIRE) IMPLANT

## 2023-08-14 NOTE — Transfer of Care (Signed)
 Immediate Anesthesia Transfer of Care Note  Patient: Theresa Phelps  Procedure(s) Performed: CORRECTION, HAMMER TOE (Left: Toe) CAPSULOTOMY, MTP JOINT (Left)  Patient Location: PACU  Anesthesia Type:MAC  Level of Consciousness: drowsy  Airway & Oxygen Therapy: Patient Spontanous Breathing and Patient connected to face mask oxygen  Post-op Assessment: Report given to RN and Post -op Vital signs reviewed and stable  Post vital signs: Reviewed and stable  Last Vitals:  Vitals Value Taken Time  BP 119/65 08/14/23 09:39  Temp 36.3 C 08/14/23 09:39  Pulse 51 08/14/23 09:40  Resp 22 08/14/23 09:40  SpO2 100 % 08/14/23 09:40  Vitals shown include unfiled device data.  Last Pain:  Vitals:   08/14/23 0721  PainSc: 9          Complications: No notable events documented.

## 2023-08-14 NOTE — Anesthesia Postprocedure Evaluation (Signed)
 Anesthesia Post Note  Patient: Theresa Phelps  Procedure(s) Performed: CORRECTION, HAMMER TOE (Left: Toe) CAPSULOTOMY, MTP JOINT (Left)  Patient location during evaluation: PACU Anesthesia Type: General Level of consciousness: awake and alert Pain management: pain level controlled Vital Signs Assessment: post-procedure vital signs reviewed and stable Respiratory status: spontaneous breathing, nonlabored ventilation, respiratory function stable and patient connected to nasal cannula oxygen Cardiovascular status: blood pressure returned to baseline and stable Postop Assessment: no apparent nausea or vomiting Anesthetic complications: no   No notable events documented.   Last Vitals:  Vitals:   08/14/23 1015 08/14/23 1030  BP: 104/61 101/64  Pulse: (!) 44 (!) 44  Resp: 17 12  Temp:    SpO2: 92% 90%    Last Pain:  Vitals:   08/14/23 1030  PainSc: Asleep                 Lendia LITTIE Mae

## 2023-08-14 NOTE — Anesthesia Preprocedure Evaluation (Addendum)
 Anesthesia Evaluation  Patient identified by MRN, date of birth, ID band Patient awake    Reviewed: Allergy & Precautions, NPO status , Patient's Chart, lab work & pertinent test results  History of Anesthesia Complications Negative for: history of anesthetic complications  Airway Mallampati: III  TM Distance: >3 FB Neck ROM: full    Dental  (+) Edentulous Upper, Edentulous Lower   Pulmonary asthma , COPD,  COPD inhaler, former smoker   Pulmonary exam normal        Cardiovascular pulmonary hypertension (resolved)+ CAD, + Past MI, + Cardiac Stents, + CABG and + Peripheral Vascular Disease  Normal cardiovascular exam+ dysrhythmias (first-degree AV block) + Valvular Problems/Murmurs (severe mitral valve prolapse and regurgitation (s/p MVR))   Echo 2024  INTERPRETATION  NORMAL LEFT VENTRICULAR SYSTOLIC FUNCTION   WITH MILD LVH  NORMAL RIGHT VENTRICULAR SYSTOLIC FUNCTION  MILD VALVULAR REGURGITATION (See above)  NO VALVULAR STENOSIS  IRREGULAR HEART RHYTHM CAPTURED THROUGHOUT EXAM  ESTIMATED LVEF WITHIN LOWER LIMITS OF NORMAL: 50-55%  CALCULATED: 54.2%  GLS: -13.0%  NO SIGNIFICANT CHANGE FROM PREVIOUS ECHO     Neuro/Psych  Neuromuscular disease CVA  negative psych ROS   GI/Hepatic Neg liver ROS,GERD  Medicated,,  Endo/Other  negative endocrine ROS    Renal/GU negative Renal ROS  negative genitourinary   Musculoskeletal   Abdominal   Peds  Hematology negative hematology ROS (+)   Anesthesia Other Findings Past Medical History: No date: 1st degree AV block No date: Acid reflux No date: Aortic atherosclerosis (HCC) No date: Asthma No date: Atrial fibrillation/flutter [facility])     Comment:  a.) CHA2DS2VASc = 6 (sex, HFpEF, HTN, CVA x 2, prior               MI/vascular disease) as of 08/11/2023; b.) rate/rhythm               maintained intrinsically without pharmocological               intervention; chronically  anticoagulated with warfarin No date: Bilateral carotid artery stenosis No date: Carpal tunnel syndrome of left wrist No date: Centrilobular emphysema (HCC) No date: Cervical radiculopathy No date: Coronary artery disease involving native coronary artery of  native heart No date: CVA (cerebral vascular accident)     Comment:  a.) LEFT frontal lobe ACA territory infarct noted on CT               head 07/20/2022; noted to be new when compared to               09/09/2014 imaging 07/2023: Hammertoe of left foot No date: Heart failure with recovered ejection fraction (HFrecEF)  (HCC) No date: Hyperlipidemia No date: Lumbar radiculopathy 2007: Myocardial infarction (HCC) No date: Osteoarthritis of both knees No date: PAD (peripheral artery disease) (HCC)     Comment:  a.) s/p BILATERAL iliac artery stenting 2020; b.) s/p               LEFT SFA and LEFT popliteal mechanical thrombectomy with               Rotarex 2024 No date: Pre-diabetes No date: Pulmonary hypertension (HCC) No date: Rheumatoid arthritis (HCC) 12/13/2017: S/P CABG x 2     Comment:  a.) LIMA-LAD, SVG-RCA 12/13/2017: S/P MVR (mitral valve replacement)     Comment:  a.) 29 mm St. Jude mechanical valve No date: Severe mitral regurgitation     Comment:  a.) s/p MVR 12/13/2017 No date: Warfarin  anticoagulation  Past Surgical History: 01/04/2012: BREAST BIOPSY; Left 04/30/2021: COLONOSCOPY WITH PROPOFOL ; N/A     Comment:  Procedure: COLONOSCOPY WITH PROPOFOL ;  Surgeon:               Maryruth Ole DASEN, MD;  Location: [facility];                Service: Endoscopy;  Laterality: N/A; 08/13/2021: COLONOSCOPY WITH PROPOFOL ; N/A     Comment:  Procedure: COLONOSCOPY WITH PROPOFOL ;  Surgeon:               Maryruth Ole DASEN, MD;  Location: [facility];                Service: Endoscopy;  Laterality: N/A; 12/13/2017: CORONARY ARTERY BYPASS GRAFT; N/A     Comment:  Procedure: CORONARY ARTERY BYPASS GRAFT; Location:  [facility];              Surgeon: Nancyann Schwab, MD 09/04/2020: INCISION AND DRAINAGE ABSCESS; Right     Comment:  Procedure: INCISION AND DRAINAGE ABSCESS-Perianal;                Surgeon: Rodolph Romano, MD;  Location: [facility];               Service: General;  Laterality: Right; 05/28/2015: LOWER EXTREMITY ANGIOGRAPHY; Left     Comment:  Procedure: Lower Extremity Angiography;  Surgeon: Selinda GORMAN Gu, MD;  Location: [facility];  Service:               Cardiovascular;  Laterality: Left; 12/28/2021: LOWER EXTREMITY ANGIOGRAPHY; Left     Comment:  Procedure: Lower Extremity Angiography;  Surgeon: Gu Selinda GORMAN, MD;  Location: [facility];  Service:               Cardiovascular;  Laterality: Left; 11/21/2022: LOWER EXTREMITY ANGIOGRAPHY; Left     Comment:  Procedure: Lower Extremity Angiography;  Surgeon: Gu Selinda GORMAN, MD;  Location: [facility];  Service:               Cardiovascular;  Laterality: Left; 11/22/2022: LOWER EXTREMITY ANGIOGRAPHY; Left     Comment:  Procedure: Lower Extremity Angiography;  Surgeon: Gu Selinda GORMAN, MD;  Location: [facility];  Service:               Cardiovascular;  Laterality: Left; 12/13/2017: MITRAL VALVE REPLACEMENT; N/A     Comment:  Procedure: MITRAL VALVE REPLACEMENT; Location: [facility];               Surgeon: Nancyann Schwab, MD 07/16/2015: PERIPHERAL VASCULAR CATHETERIZATION; Right     Comment:  Procedure: Lower Extremity Angiography;  Surgeon: Selinda GORMAN Gu, MD;  Location: [facility];  Service:               Cardiovascular;  Laterality: Right; 07/16/2015: PERIPHERAL VASCULAR CATHETERIZATION     Comment:  Procedure: Lower Extremity Intervention;  Surgeon: Selinda GORMAN Gu, MD;  Location: [facility] INVASIVE  CV LAB;  Service:               Cardiovascular;; 11/07/2017: RIGHT/LEFT HEART CATH AND CORONARY ANGIOGRAPHY; Bilateral     Comment:   Procedure: RIGHT/LEFT HEART CATH AND CORONARY               ANGIOGRAPHY;  Surgeon: Hester Wolm PARAS, MD;  Location:               [facility];  Service: Cardiovascular;                Laterality: Bilateral; 02/24/2023: RIGHT/LEFT HEART CATH AND CORONARY ANGIOGRAPHY; Bilateral 11/07/2017: TEE WITHOUT CARDIOVERSION; N/A     Comment:  Procedure: TRANSESOPHAGEAL ECHOCARDIOGRAM (TEE);                Surgeon: Hester Wolm PARAS, MD;  Location: [facility];                Service: Cardiovascular;  Laterality: N/A; No date: WISDOM TOOTH EXTRACTION  BMI    Body Mass Index: 33.00 kg/m      Reproductive/Obstetrics negative OB ROS                              Anesthesia Physical Anesthesia Plan  ASA: 4  Anesthesia Plan: General   Post-op Pain Management: Ofirmev  IV (intra-op)* and Toradol  IV (intra-op)*   Induction: Intravenous  PONV Risk Score and Plan: 2 and Propofol  infusion and TIVA  Airway Management Planned: Natural Airway and Nasal Cannula  Additional Equipment:   Intra-op Plan:   Post-operative Plan:   Informed Consent: I have reviewed the patients History and Physical, chart, labs and discussed the procedure including the risks, benefits and alternatives for the proposed anesthesia with the patient or authorized representative who has indicated his/her understanding and acceptance.     Dental Advisory Given  Plan Discussed with: Anesthesiologist, CRNA and Surgeon  Anesthesia Plan Comments: (Patient consented for risks of anesthesia including but not limited to:  - adverse reactions to medications - risk of airway placement if required - damage to eyes, teeth, lips or other oral mucosa - nerve damage due to positioning  - sore throat or hoarseness - Damage to heart, brain, nerves, lungs, other parts of body or loss of life  Patient voiced understanding and assent.)         Anesthesia Quick Evaluation

## 2023-08-14 NOTE — Discharge Instructions (Signed)
 After Surgery Instructions   1) If you are recuperating from surgery anywhere other than home, please be sure to leave us  the number where you can be reached.  2) Go directly home and rest.  3) Keep the operated foot(feet) elevated six inches above the hip when sitting or lying down. This will help control swelling and pain.  4) Support the elevated foot and leg with pillows. DO NOT PLACE PILLOWS UNDER THE KNEE.  5) DO NOT REMOVE or get your bandages WET, unless you were given different instructions by your doctor to do so. This increases the risk of infection.  6) Wear your surgical shoe or surgical boot at all times when you are up on your feet.  7) A limited amount of pain and swelling may occur. The skin may take on a bruised appearance. DO NOT BE ALARMED, THIS IS NORMAL.  8) For slight pain and swelling, apply an ice pack directly over the bandages for 15 minutes only out of each hour of the day. Continue until seen in the office for your first post op visit. DON NOT     APPLY ANY FORM OF HEAT TO THE AREA.  9) Have prescriptions filled immediately and take as directed.  10) Drink lots of liquids, water and juice to stay hydrated.  11) CALL IMMEDIATELY IF:  *Bleeding continues until the following day of surgery  *Pain increases and/or does not respond to medication  *Bandages or cast appears to tight  *If your bandage gets wet  *Trip, fall or stump your surgical foot  *If your temperature goes above 101  *If you have ANY questions at all  YOU NOW CONTROL THE EFFORT OF YOUR RECOVERY. ADHERING TO THESE INSTRUCTIONS WILL OFFER YOU THE MOST COMPLETE RESULTS

## 2023-08-14 NOTE — Interval H&P Note (Signed)
 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  The various methods of treatment have been discussed with the patient and family. After consideration of risks, benefits and other options for treatment, the patient has consented to  Procedure(s): CORRECTION, HAMMER TOE (Left) CAPSULOTOMY, MTP JOINT (Left) as a surgical intervention.  The patient's history has been reviewed, patient examined, no change in status, stable for surgery.  I have reviewed the patient's chart and labs.  Questions were answered to the patient's satisfaction.     [name]

## 2023-08-14 NOTE — Op Note (Signed)
 Surgeon: Surgeon(s): Tobie Franky SQUIBB, DPM  Assistants: None Pre-operative diagnosis: HAMMER TOE and CONTRACTURE, LEFT FOOT.  Post-operative diagnosis: same Procedure: Procedure(s) (LRB): CORRECTION, HAMMER TOE (Left) CAPSULOTOMY, MTP JOINT (Left)  Pathology: * No specimens in log *  Pertinent Intra-op findings: hammertoe contracture noted at second, third, fourth and metatarsophalangeal joint contracture noted of second, third, fourth MPJ. Anesthesia: Monitor Anesthesia Care  Hemostasis:  Total Tourniquet Time Documented: Calf (Left) - 34 minutes Total: Calf (Left) - 34 minutes  EBL: 5 mL  Materials: 3-0 Monocryl, 3-0 Prolene with K wire x 3 Injectables: 10 cc of half percent Marcaine  plain Complications: None  Indications for surgery: A 63 y.o. female presents with painful hammertoe contracture of left 2nd, 3rd and 4th with respective metatarsophalangeal joint contracture of second third fourth. Patient has failed all conservative therapy including but not limited to shoe gear modification padding offloading. She wishes to have surgical correction of the foot/deformity. It was determined that patient would benefit from left 2nd, 3rd and 4th hammertoe arthroplasty with metatarsophalangeal joint capsulotomy of 2nd, 3rd and 4th Informed surgical risk consent was reviewed and read aloud to the patient.  I reviewed the films.  I have discussed my findings with the patient in great detail.  I have discussed all risks including but not limited to infection, stiffness, scarring, limp, disability, deformity, damage to blood vessels and nerves, numbness, poor healing, need for braces, arthritis, chronic pain, amputation, death.  All benefits and realistic expectations discussed in great detail.  I have made no promises as to the outcome.  I have provided realistic expectations.  I have offered the patient a 2nd opinion, which they have declined and assured me they preferred to proceed despite the  risks   Procedure in detail: The patient was both verbally and visually identified by myself, the nursing staff, and anesthesia staff in the preoperative holding area. They were then transferred to the operating room and placed on the operative table in supine position.  Incision was made in the dorsal aspect of the second toe, third, fourth.  The incision was carried down through the subcutaneous tissue and superficial fascia.  Several superficial vessels were cauterized and cut.  The superficial fascia was reflected from the deep fascia.  The extensor tendon was incised transversely and reflected proximally.  The head of the proximal phalanx was freed up from all surrounding soft tissue structures.  The head of the proximal phalanx was resected with a sagittal saw.  Any sharp edges remaining were reduced with a reciprocating power rasp. 0.045 K-wire placed through middle and distal phalanx and then retrogradely placed into proximal phalanx for addional stability. The surgery site was irrigated with normal saline.  The extensor tendon was reapproximated with 4-0 Vicryl.  The skin was closed with 4-0 nylon continuous interlocking sutures.  At the conclusion of the procedure the patient was awoken from anesthesia and found to have tolerated the procedure well any complications. There were transferred to PACU with vital signs stable and vascular status intact.  Aeon Koors, DPM

## 2023-08-15 ENCOUNTER — Encounter: Payer: Self-pay | Admitting: Podiatry

## 2023-08-16 ENCOUNTER — Telehealth: Payer: Self-pay | Admitting: Podiatry

## 2023-08-16 DIAGNOSIS — R2689 Other abnormalities of gait and mobility: Secondary | ICD-10-CM

## 2023-08-16 NOTE — Telephone Encounter (Signed)
 Spoke to daughter and patient will pick up in  office.

## 2023-08-16 NOTE — Telephone Encounter (Signed)
 Patient would like a script for crutches.  Daughter, APril can pick up in [place] or [place].  Call her at [phone].

## 2023-08-22 ENCOUNTER — Ambulatory Visit (INDEPENDENT_AMBULATORY_CARE_PROVIDER_SITE_OTHER): Admitting: Podiatry

## 2023-08-22 ENCOUNTER — Other Ambulatory Visit: Payer: Self-pay | Admitting: Podiatry

## 2023-08-22 ENCOUNTER — Ambulatory Visit (INDEPENDENT_AMBULATORY_CARE_PROVIDER_SITE_OTHER)

## 2023-08-22 DIAGNOSIS — Z9889 Other specified postprocedural states: Secondary | ICD-10-CM

## 2023-08-22 DIAGNOSIS — M2042 Other hammer toe(s) (acquired), left foot: Secondary | ICD-10-CM | POA: Diagnosis not present

## 2023-08-22 MED ORDER — OXYCODONE-ACETAMINOPHEN 5-325 MG PO TABS: 1.0000 | ORAL_TABLET | ORAL | 0 refills | Status: AC | PRN

## 2023-08-22 NOTE — Progress Notes (Signed)
 Subjective:  Patient ID: Theresa Phelps Shoulder, female    DOB: 1960/12/30,  MRN: [id number]  Chief Complaint  Patient presents with   Routine Post Op    POV # 1 DOS 08/14/23 LT 2ND THRU 4TH HAMMERTOE ARTHROPLASTY W/ KWIRE, CAPSULTOMY LT 2ND THRU 4TH MPJ    DOS: 08/14/2023 Procedure: Left 2nd through 4th hammertoe arthroplasty with capsulotomy of 2nd through 4th MPJ  63 y.o. female returns for post-op check.  Patient states that she is doing well pain is controlled bandages clean dry and intact partially weightbearing to the heel with crutches and surgical shoe.  No nausea fever chills normal  Review of Systems: Negative except as noted in the HPI. Denies N/V/F/Ch.  Past Medical History:  Diagnosis Date   1st degree AV block    Acid reflux    Aortic atherosclerosis (HCC)    Asthma    Atrial fibrillation/flutter (HCC)    a.) CHA2DS2VASc = 6 (sex, HFpEF, HTN, CVA x 2, prior MI/vascular disease) as of 08/11/2023; b.) rate/rhythm maintained intrinsically without pharmocological intervention; chronically anticoagulated with warfarin   Bilateral carotid artery stenosis    Carpal tunnel syndrome of left wrist    Centrilobular emphysema (HCC)    Cervical radiculopathy    Coronary artery disease involving native coronary artery of native heart    CVA (cerebral vascular accident)    a.) LEFT frontal lobe ACA territory infarct noted on CT head 07/20/2022; noted to be new when compared to 09/09/2014 imaging   Hammertoe of left foot 07/2023   Heart failure with recovered ejection fraction (HFrecEF) (HCC)    Hyperlipidemia    Lumbar radiculopathy    Myocardial infarction [facility]) 2007   Osteoarthritis of both knees    PAD (peripheral artery disease) (HCC)    a.) s/p BILATERAL iliac artery stenting 2020; b.) s/p LEFT SFA and LEFT popliteal mechanical thrombectomy with Rotarex 2024   Pre-diabetes    Pulmonary hypertension (HCC)    Rheumatoid arthritis (HCC)    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/13/2017   a.) LIMA-LAD,  SVG-RCA   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 Jude mechanical valve   Severe mitral regurgitation    a.) s/p MVR 12/13/2017   Warfarin anticoagulation     Current Outpatient Medications:    oxyCODONE -acetaminophen  (PERCOCET) 5-325 MG tablet, Take 1 tablet by mouth every 4 (four) hours as needed for severe pain (pain score 7-10)., Disp: 30 tablet, Rfl: 0   acetaminophen  (TYLENOL ) 500 MG tablet, Take 2 tablets (1,000 mg total) by mouth every 6 (six) hours as needed. (Patient taking differently: Take 500 mg by mouth every 6 (six) hours as needed.), Disp: 30 tablet, Rfl: 0   amLODipine  (NORVASC ) 5 MG tablet, Take 5 mg by mouth daily., Disp: , Rfl:    atorvastatin  (LIPITOR ) 80 MG tablet, Take 80 mg by mouth daily., Disp: , Rfl:    cetirizine (ZYRTEC) 10 MG tablet, Take 10 mg by mouth at bedtime. Reported on 05/28/2015, Disp: , Rfl:    enoxaparin (LOVENOX) 100 MG/ML injection, Inject 100 mg into the skin daily. Patient states she took one dose Friday evening, morning and evening Saturday and 1 dose Sunday morning, Disp: , Rfl:    EPINEPHrine  0.3 mg/0.3 mL IJ SOAJ injection, Inject into the muscle as directed., Disp: , Rfl:    ezetimibe  (ZETIA ) 10 MG tablet, Take 10 mg by mouth daily., Disp: , Rfl:    ibuprofen  (ADVIL ) 800 MG tablet, Take 1 tablet (  800 mg total) by mouth every 6 (six) hours as needed., Disp: 60 tablet, Rfl: 1   nitroGLYCERIN  (NITROSTAT ) 0.4 MG SL tablet, Place 0.4 mg under the tongue every 5 (five) minutes as needed for chest pain., Disp: , Rfl:    oxyCODONE -acetaminophen  (PERCOCET) 5-325 MG tablet, Take 1 tablet by mouth every 4 (four) hours as needed for severe pain (pain score 7-10)., Disp: 30 tablet, Rfl: 0   Semaglutide-Weight Management (WEGOVY) 0.5 MG/0.5ML SOAJ, Inject 0.5 mg into the skin once a week. Did not start yet, Disp: , Rfl:    warfarin (COUMADIN ) 5 MG tablet, Take 5 mg by mouth every evening., Disp: , Rfl:   Social History   Tobacco Use   Smoking Status Former   Current packs/day: 0.00   Average packs/day: 0.5 packs/day for 20.0 years (10.0 ttl pk-yrs)   Types: Cigarettes   Start date: 12/1998   Quit date: 12/2018   Years since quitting: 4.7  Smokeless Tobacco Never    Allergies  Allergen Reactions   Shellfish Allergy Anaphylaxis   Banana Other (See Comments)    Burning in the mouth   Garlic Other (See Comments)    Pt states arms freeze up and can't talk but no SOB   Isosorbide Nitrate Itching    Blurred vision   Fish-Derived Products Rash   Objective:  There were no vitals filed for this visit. There is no height or weight on file to calculate BMI. Constitutional Well developed. Well nourished.  Vascular Foot warm and well perfused. Capillary refill normal to all digits.   Neurologic Normal speech. Oriented to person, place, and time. Epicritic sensation to light touch grossly present bilaterally.  Dermatologic Skin healing well without signs of infection. Skin edges well coapted without signs of infection.  Orthopedic: Tenderness to palpation noted about the surgical site.   Radiographs: 3 views of skeletally mature adult left foot: Hardware is intact no signs of backing or loosening noted no other abnormalities identified good correction alignment noted status post hammertoe repair Assessment:   1. Hammer toe of left foot   2. Status post foot surgery    Plan:  Patient was evaluated and treated and all questions answered.  S/p foot surgery left -Progressing as expected post-operatively. -XR: See above -WB Status: Weightbearing as tolerated to the heel in surgical shoe -Sutures: Intact.  No clinical signs of dehiscence noted no complication noted. -Medications: None -Foot redressed.  No follow-ups on file.

## 2023-08-23 NOTE — Telephone Encounter (Signed)
 Recd email from April, that Theresa Phelps needs forms again that I had faxed. She gave email and it bounced back, so I emailed her to get the correct email address to send to him again.

## 2023-08-24 NOTE — Telephone Encounter (Signed)
 cld and s/w pt's daughter and she corrected the email for [place]. It is [email or web address]. I adv would email him again.

## 2023-09-05 ENCOUNTER — Ambulatory Visit (INDEPENDENT_AMBULATORY_CARE_PROVIDER_SITE_OTHER): Admitting: Podiatry

## 2023-09-05 DIAGNOSIS — M216X1 Other acquired deformities of right foot: Secondary | ICD-10-CM

## 2023-09-05 DIAGNOSIS — M216X2 Other acquired deformities of left foot: Secondary | ICD-10-CM | POA: Diagnosis not present

## 2023-09-05 DIAGNOSIS — Z9889 Other specified postprocedural states: Secondary | ICD-10-CM

## 2023-09-05 NOTE — Progress Notes (Signed)
 Subjective:  Patient ID: Theresa Phelps, female    DOB: 26-Apr-1960,  MRN: [id number]  No chief complaint on file.   DOS: 08/14/2023 Procedure: Left 2nd through 4th digit hammertoe arthroplasty with capsulotomy  63 y.o. female returns for post-op check.  Patient states that she is doing well denies any other acute complaints.  Bandages clean dry and intact here to get the stitches out  Review of Systems: Negative except as noted in the HPI. Denies N/V/F/Ch.  Past Medical History:  Diagnosis Date   1st degree AV block    Acid reflux    Aortic atherosclerosis (HCC)    Asthma    Atrial fibrillation/flutter (HCC)    a.) CHA2DS2VASc = 6 (sex, HFpEF, HTN, CVA x 2, prior MI/vascular disease) as of 08/11/2023; b.) rate/rhythm maintained intrinsically without pharmocological intervention; chronically anticoagulated with warfarin   Bilateral carotid artery stenosis    Carpal tunnel syndrome of left wrist    Centrilobular emphysema (HCC)    Cervical radiculopathy    Coronary artery disease involving native coronary artery of native heart    CVA (cerebral vascular accident)    a.) LEFT frontal lobe ACA territory infarct noted on CT head 07/20/2022; noted to be new when compared to 09/09/2014 imaging   Hammertoe of left foot 07/2023   Heart failure with recovered ejection fraction (HFrecEF) (HCC)    Hyperlipidemia    Lumbar radiculopathy    Myocardial infarction [facility]) 2007   Osteoarthritis of both knees    PAD (peripheral artery disease) (HCC)    a.) s/p BILATERAL iliac artery stenting 2020; b.) s/p LEFT SFA and LEFT popliteal mechanical thrombectomy with Rotarex 2024   Pre-diabetes    Pulmonary hypertension (HCC)    Rheumatoid arthritis (HCC)    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 Jude mechanical valve   Severe mitral regurgitation    a.) s/p MVR 12/13/2017   Warfarin anticoagulation     Current  Outpatient Medications:    acetaminophen  (TYLENOL ) 500 MG tablet, Take 2 tablets (1,000 mg total) by mouth every 6 (six) hours as needed. (Patient taking differently: Take 500 mg by mouth every 6 (six) hours as needed.), Disp: 30 tablet, Rfl: 0   amLODipine  (NORVASC ) 5 MG tablet, Take 5 mg by mouth daily., Disp: , Rfl:    atorvastatin  (LIPITOR ) 80 MG tablet, Take 80 mg by mouth daily., Disp: , Rfl:    cetirizine (ZYRTEC) 10 MG tablet, Take 10 mg by mouth at bedtime. Reported on 05/28/2015, Disp: , Rfl:    enoxaparin (LOVENOX) 100 MG/ML injection, Inject 100 mg into the skin daily. Patient states she took one dose Friday evening, morning and evening Saturday and 1 dose Sunday morning, Disp: , Rfl:    EPINEPHrine  0.3 mg/0.3 mL IJ SOAJ injection, Inject into the muscle as directed., Disp: , Rfl:    ezetimibe  (ZETIA ) 10 MG tablet, Take 10 mg by mouth daily., Disp: , Rfl:    ibuprofen  (ADVIL ) 800 MG tablet, Take 1 tablet (800 mg total) by mouth every 6 (six) hours as needed., Disp: 60 tablet, Rfl: 1   nitroGLYCERIN  (NITROSTAT ) 0.4 MG SL tablet, Place 0.4 mg under the tongue every 5 (five) minutes as needed for chest pain., Disp: , Rfl:    oxyCODONE -acetaminophen  (PERCOCET) 5-325 MG tablet, Take 1 tablet by mouth every 4 (four) hours as needed for severe pain (pain score 7-10)., Disp: 30  tablet, Rfl: 0   oxyCODONE -acetaminophen  (PERCOCET) 5-325 MG tablet, Take 1 tablet by mouth every 4 (four) hours as needed for severe pain (pain score 7-10)., Disp: 30 tablet, Rfl: 0   Semaglutide-Weight Management (WEGOVY) 0.5 MG/0.5ML SOAJ, Inject 0.5 mg into the skin once a week. Did not start yet, Disp: , Rfl:    warfarin (COUMADIN ) 5 MG tablet, Take 5 mg by mouth every evening., Disp: , Rfl:   Social History   Tobacco Use  Smoking Status Former   Current packs/day: 0.00   Average packs/day: 0.5 packs/day for 20.0 years (10.0 ttl pk-yrs)   Types: Cigarettes   Start date: 12/1998   Quit date: 12/2018   Years  since quitting: 4.7  Smokeless Tobacco Never    Allergies  Allergen Reactions   Shellfish Allergy Anaphylaxis   Banana Other (See Comments)    Burning in the mouth   Garlic Other (See Comments)    Pt states arms freeze up and can't talk but no SOB   Isosorbide Nitrate Itching    Blurred vision   Fish-Derived Products Rash   Objective:  There were no vitals filed for this visit. There is no height or weight on file to calculate BMI. Constitutional Well developed. Well nourished.  Vascular Foot warm and well perfused. Capillary refill normal to all digits.   Neurologic Normal speech. Oriented to person, place, and time. Epicritic sensation to light touch grossly present bilaterally.  Dermatologic Skin healing well without signs of infection. Skin edges well coapted without signs of infection.  Orthopedic: Tenderness to palpation noted about the surgical site.   Radiographs: None Assessment:   1. Other acquired deformities of left foot   2. Other acquired deformities of right foot   3. Status post foot surgery    Plan:  Patient was evaluated and treated and all questions answered.  S/p foot surgery left -Progressing as expected post-operatively. -XR: See above -WB Status: Weightbearing as tolerated in cam boot -Sutures: Intact.  No clinical signs of dehiscence noted no complication noted -Medications: None -Foot redressed.  Pes planovalgus/foot deformity -I explained to patient the etiology of pes planovalgus and relationship with Planter fasciitis and various treatment options were discussed.  Given patient foot structure in the setting of Planter fasciitis I believe patient will benefit from custom-made orthotics to help control the hindfoot motion support the arch of the foot and take the stress away from plantar fascial.  Patient agrees with the plan like to proceed with orthotics -Patient was casted for orthotics    No follow-ups on file.

## 2023-09-21 ENCOUNTER — Ambulatory Visit (INDEPENDENT_AMBULATORY_CARE_PROVIDER_SITE_OTHER)

## 2023-09-21 ENCOUNTER — Other Ambulatory Visit: Payer: Self-pay | Admitting: Podiatry

## 2023-09-21 ENCOUNTER — Ambulatory Visit: Admitting: Podiatry

## 2023-09-21 DIAGNOSIS — M2042 Other hammer toe(s) (acquired), left foot: Secondary | ICD-10-CM

## 2023-09-21 DIAGNOSIS — T8130XA Disruption of wound, unspecified, initial encounter: Secondary | ICD-10-CM

## 2023-09-21 MED ORDER — OXYCODONE-ACETAMINOPHEN 5-325 MG PO TABS: 1.0000 | ORAL_TABLET | ORAL | 0 refills | Status: AC | PRN

## 2023-09-21 MED ORDER — DOXYCYCLINE HYCLATE 100 MG PO TABS: 100.0000 mg | ORAL_TABLET | Freq: Two times a day (BID) | ORAL | 0 refills | Status: AC

## 2023-09-21 NOTE — Progress Notes (Signed)
 Subjective:  Patient ID: Theresa Phelps, female    DOB: 07/03/1960,  MRN: [id number]  Chief Complaint  Patient presents with   Wound Check   Toe Pain    post op #3. Pain scale 12/10. Very wet and oozy    DOS: 08/14/2023 Procedure: Left 2nd through 4th digit hammertoe arthroplasty with capsulotomy  63 y.o. female returns for post-op check.  She states she is doing okay she is having a lot of pain some erythema.  There are some swelling present.  Wanted to get it evaluated  Review of Systems: Negative except as noted in the HPI. Denies N/V/F/Ch.  Past Medical History:  Diagnosis Date   1st degree AV block    Acid reflux    Aortic atherosclerosis    Asthma    Atrial fibrillation/flutter (HCC)    a.) CHA2DS2VASc = 6 (sex, HFpEF, HTN, CVA x 2, prior MI/vascular disease) as of 08/11/2023; b.) rate/rhythm maintained intrinsically without pharmocological intervention; chronically anticoagulated with warfarin   Bilateral carotid artery stenosis    Carpal tunnel syndrome of left wrist    Centrilobular emphysema (HCC)    Cervical radiculopathy    Coronary artery disease involving native coronary artery of native heart    CVA (cerebral vascular accident)    a.) LEFT frontal lobe ACA territory infarct noted on CT head 07/20/2022; noted to be new when compared to 09/09/2014 imaging   Hammertoe of left foot 07/2023   Heart failure with recovered ejection fraction (HFrecEF) (HCC)    Hyperlipidemia    Lumbar radiculopathy    Myocardial infarction [facility]) 2007   Osteoarthritis of both knees    PAD (peripheral artery disease)    a.) s/p BILATERAL iliac artery stenting 2020; b.) s/p LEFT SFA and LEFT popliteal mechanical thrombectomy with Rotarex 2024   Pre-diabetes    Pulmonary hypertension (HCC)    Rheumatoid arthritis (HCC)    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 Jude mechanical valve   Severe mitral  regurgitation    a.) s/p MVR 12/13/2017   Warfarin anticoagulation     Current Outpatient Medications:    doxycycline  (VIBRA -TABS) 100 MG tablet, Take 1 tablet (100 mg total) by mouth 2 (two) times daily., Disp: 28 tablet, Rfl: 0   oxyCODONE -acetaminophen  (PERCOCET) 5-325 MG tablet, Take 1 tablet by mouth every 4 (four) hours as needed for severe pain (pain score 7-10)., Disp: 30 tablet, Rfl: 0   acetaminophen  (TYLENOL ) 500 MG tablet, Take 2 tablets (1,000 mg total) by mouth every 6 (six) hours as needed. (Patient taking differently: Take 500 mg by mouth every 6 (six) hours as needed.), Disp: 30 tablet, Rfl: 0   amLODipine  (NORVASC ) 5 MG tablet, Take 5 mg by mouth daily., Disp: , Rfl:    atorvastatin  (LIPITOR ) 80 MG tablet, Take 80 mg by mouth daily., Disp: , Rfl:    cetirizine (ZYRTEC) 10 MG tablet, Take 10 mg by mouth at bedtime. Reported on 05/28/2015, Disp: , Rfl:    enoxaparin (LOVENOX) 100 MG/ML injection, Inject 100 mg into the skin daily. Patient states she took one dose Friday evening, morning and evening Saturday and 1 dose Sunday morning, Disp: , Rfl:    EPINEPHrine  0.3 mg/0.3 mL IJ SOAJ injection, Inject into the muscle as directed., Disp: , Rfl:    ezetimibe  (ZETIA ) 10 MG tablet, Take 10 mg by mouth daily., Disp: , Rfl:  ibuprofen  (ADVIL ) 800 MG tablet, Take 1 tablet (800 mg total) by mouth every 6 (six) hours as needed., Disp: 60 tablet, Rfl: 1   nitroGLYCERIN  (NITROSTAT ) 0.4 MG SL tablet, Place 0.4 mg under the tongue every 5 (five) minutes as needed for chest pain., Disp: , Rfl:    oxyCODONE -acetaminophen  (PERCOCET) 5-325 MG tablet, Take 1 tablet by mouth every 4 (four) hours as needed for severe pain (pain score 7-10)., Disp: 30 tablet, Rfl: 0   oxyCODONE -acetaminophen  (PERCOCET) 5-325 MG tablet, Take 1 tablet by mouth every 4 (four) hours as needed for severe pain (pain score 7-10)., Disp: 30 tablet, Rfl: 0   Semaglutide-Weight Management (WEGOVY) 0.5 MG/0.5ML SOAJ, Inject 0.5  mg into the skin once a week. Did not start yet, Disp: , Rfl:    warfarin (COUMADIN ) 5 MG tablet, Take 5 mg by mouth every evening., Disp: , Rfl:   Social History   Tobacco Use  Smoking Status Former   Current packs/day: 0.00   Average packs/day: 0.5 packs/day for 20.0 years (10.0 ttl pk-yrs)   Types: Cigarettes   Start date: 12/1998   Quit date: 12/2018   Years since quitting: 4.8  Smokeless Tobacco Never    Allergies  Allergen Reactions   Shellfish Allergy Anaphylaxis   Banana Other (See Comments)    Burning in the mouth   Garlic Other (See Comments)    Pt states arms freeze up and can't talk but no SOB   Isosorbide Nitrate Itching    Blurred vision   Fish-Derived Products Rash   Objective:  There were no vitals filed for this visit. There is no height or weight on file to calculate BMI. Constitutional Well developed. Well nourished.  Vascular Foot warm and well perfused. Capillary refill normal to all digits.   Neurologic Normal speech. Oriented to person, place, and time. Epicritic sensation to light touch grossly present bilaterally.  Dermatologic Superficial dehiscence noted to multiple incision.  Does not probe down to deep tissue.  No purulent drainage noted mild erythema noted.  Some swelling noted.  Good correction alignment of the digits noted no  Orthopedic: Tenderness to palpation noted about the surgical site.   Radiographs: None Assessment:   1. Hammer toe of left foot    Plan:  Patient was evaluated and treated and all questions answered.  S/p foot surgery left -Progressing as expected post-operatively. -XR: See above -WB Status: Weightbearing as tolerated in cam boot -Sutures: Removed the superficial dehiscence present.  Some redness noted. -Medications: Doxycycline  was sent to the pharmacy - Encouraged patient to do Betadine wet-to-dry dressing changes daily.  Pes planovalgus/foot deformity -I explained to patient the etiology of pes  planovalgus and relationship with Planter fasciitis and various treatment options were discussed.  Given patient foot structure in the setting of Planter fasciitis I believe patient will benefit from custom-made orthotics to help control the hindfoot motion support the arch of the foot and take the stress away from plantar fascial.  Patient agrees with the plan like to proceed with orthotics -Patient was casted for orthotics    No follow-ups on file.

## 2023-10-05 ENCOUNTER — Ambulatory Visit: Admitting: Podiatry

## 2023-10-05 DIAGNOSIS — T8130XA Disruption of wound, unspecified, initial encounter: Secondary | ICD-10-CM

## 2023-10-05 DIAGNOSIS — M2042 Other hammer toe(s) (acquired), left foot: Secondary | ICD-10-CM

## 2023-10-05 NOTE — Progress Notes (Signed)
 Subjective:  Patient ID: Theresa Phelps, female    DOB: [birth year],  MRN: [id number]  Chief Complaint  Patient presents with   Routine Post Op    DOS: 08/14/2023 Procedure: Left 2nd through 4th digit hammertoe arthroplasty with capsulotomy  63 y.o. female returns for post-op check.  She states she is doing okay she is having a lot of pain some erythema.  There are some swelling present.  Wanted to get it evaluated  Review of Systems: Negative except as noted in the HPI. Denies N/V/F/Ch.  Past Medical History:  Diagnosis Date   1st degree AV block    Acid reflux    Aortic atherosclerosis    Asthma    Atrial fibrillation/flutter (HCC)    a.) CHA2DS2VASc = 6 (sex, HFpEF, HTN, CVA x 2, prior MI/vascular disease) as of 08/11/2023; b.) rate/rhythm maintained intrinsically without pharmocological intervention; chronically anticoagulated with warfarin   Bilateral carotid artery stenosis    Carpal tunnel syndrome of left wrist    Centrilobular emphysema (HCC)    Cervical radiculopathy    Coronary artery disease involving native coronary artery of native heart    CVA (cerebral vascular accident)    a.) LEFT frontal lobe ACA territory infarct noted on CT head 07/20/2022; noted to be new when compared to 09/09/2014 imaging   Hammertoe of left foot 07/2023   Heart failure with recovered ejection fraction (HFrecEF) (HCC)    Hyperlipidemia    Lumbar radiculopathy    Myocardial infarction [facility]) 2007   Osteoarthritis of both knees    PAD (peripheral artery disease)    a.) s/p BILATERAL iliac artery stenting 2020; b.) s/p LEFT SFA and LEFT popliteal mechanical thrombectomy with Rotarex 2024   Pre-diabetes    Pulmonary hypertension (HCC)    Rheumatoid arthritis (HCC)    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 Jude mechanical valve   Severe mitral regurgitation    a.) s/p MVR 12/13/2017   Warfarin anticoagulation      Current Outpatient Medications:    acetaminophen  (TYLENOL ) 500 MG tablet, Take 2 tablets (1,000 mg total) by mouth every 6 (six) hours as needed. (Patient taking differently: Take 500 mg by mouth every 6 (six) hours as needed.), Disp: 30 tablet, Rfl: 0   amLODipine  (NORVASC ) 5 MG tablet, Take 5 mg by mouth daily., Disp: , Rfl:    atorvastatin  (LIPITOR ) 80 MG tablet, Take 80 mg by mouth daily., Disp: , Rfl:    cetirizine (ZYRTEC) 10 MG tablet, Take 10 mg by mouth at bedtime. Reported on 05/28/2015, Disp: , Rfl:    doxycycline  (VIBRA -TABS) 100 MG tablet, Take 1 tablet (100 mg total) by mouth 2 (two) times daily., Disp: 28 tablet, Rfl: 0   enoxaparin (LOVENOX) 100 MG/ML injection, Inject 100 mg into the skin daily. Patient states she took one dose Friday evening, morning and evening Saturday and 1 dose Sunday morning, Disp: , Rfl:    EPINEPHrine  0.3 mg/0.3 mL IJ SOAJ injection, Inject into the muscle as directed., Disp: , Rfl:    ezetimibe  (ZETIA ) 10 MG tablet, Take 10 mg by mouth daily., Disp: , Rfl:    ibuprofen  (ADVIL ) 800 MG tablet, Take 1 tablet (800 mg total) by mouth every 6 (six) hours as needed., Disp: 60 tablet, Rfl: 1   nitroGLYCERIN  (NITROSTAT ) 0.4 MG SL tablet, Place 0.4 mg under the tongue every 5 (five) minutes as needed for  chest pain., Disp: , Rfl:    oxyCODONE -acetaminophen  (PERCOCET) 5-325 MG tablet, Take 1 tablet by mouth every 4 (four) hours as needed for severe pain (pain score 7-10)., Disp: 30 tablet, Rfl: 0   oxyCODONE -acetaminophen  (PERCOCET) 5-325 MG tablet, Take 1 tablet by mouth every 4 (four) hours as needed for severe pain (pain score 7-10)., Disp: 30 tablet, Rfl: 0   oxyCODONE -acetaminophen  (PERCOCET) 5-325 MG tablet, Take 1 tablet by mouth every 4 (four) hours as needed for severe pain (pain score 7-10)., Disp: 30 tablet, Rfl: 0   Semaglutide-Weight Management (WEGOVY) 0.5 MG/0.5ML SOAJ, Inject 0.5 mg into the skin once a week. Did not start yet, Disp: , Rfl:     warfarin (COUMADIN ) 5 MG tablet, Take 5 mg by mouth every evening., Disp: , Rfl:   Social History   Tobacco Use  Smoking Status Former   Current packs/day: 0.00   Average packs/day: 0.5 packs/day for 20.0 years (10.0 ttl pk-yrs)   Types: Cigarettes   Start date: 12/1998   Quit date: 12/2018   Years since quitting: 4.8  Smokeless Tobacco Never    Allergies  Allergen Reactions   Shellfish Allergy Anaphylaxis   Banana Other (See Comments)    Burning in the mouth   Garlic Other (See Comments)    Pt states arms freeze up and can't talk but no SOB   Isosorbide Nitrate Itching    Blurred vision   Fish-Derived Products Rash   Objective:  There were no vitals filed for this visit. There is no height or weight on file to calculate BMI. Constitutional Well developed. Well nourished.  Vascular Foot warm and well perfused. Capillary refill normal to all digits.   Neurologic Normal speech. Oriented to person, place, and time. Epicritic sensation to light touch grossly present bilaterally.  Dermatologic No further incision dehiscence noted.  Completely healed.  Small superficial wound noted to the fourth interdigital space.  Encourage Betadine wet-to-dry between the toes.  Orthopedic: Tenderness to palpation noted about the surgical site.   Radiographs: None Assessment:   No diagnosis found.  Plan:  Patient was evaluated and treated and all questions answered.  S/p foot surgery left -Progressing as expected post-operatively. -XR: See above -WB Status: Weightbearing as tolerated in cam boot -Sutures: Removed the superficial dehiscence present.  Some redness noted. -Medications: No further antibiotics - Betadine wet-to-dry dressing much improved  Pes planovalgus/foot deformity -I explained to patient the etiology of pes planovalgus and relationship with Planter fasciitis and various treatment options were discussed.  Given patient foot structure in the setting of Planter  fasciitis I believe patient will benefit from custom-made orthotics to help control the hindfoot motion support the arch of the foot and take the stress away from plantar fascial.  Patient agrees with the plan like to proceed with orthotics - Orthotics were dispensed they are functioning well no acute complaints    No follow-ups on file.

## 2023-10-26 ENCOUNTER — Ambulatory Visit: Admitting: Podiatry

## 2023-10-26 DIAGNOSIS — M2042 Other hammer toe(s) (acquired), left foot: Secondary | ICD-10-CM

## 2023-10-26 DIAGNOSIS — T8130XA Disruption of wound, unspecified, initial encounter: Secondary | ICD-10-CM

## 2023-10-26 NOTE — Progress Notes (Signed)
 Subjective:  Patient ID: Theresa Phelps, female    DOB: [birth year],  MRN: [id number]  Chief Complaint  Patient presents with   Theresa Phelps    DOS: 08/14/2023 Procedure: Left 2nd through 4th digit hammertoe arthroplasty with capsulotomy  63 y.o. female returns for post-op check.  She states she is doing okay she is having a lot of pain some erythema.  There are some swelling present.  Wanted to get it evaluated  Review of Systems: Negative except as noted in the HPI. Denies N/V/F/Ch.  Past Medical History:  Diagnosis Date   1st degree AV block    Acid reflux    Aortic atherosclerosis    Asthma    Atrial fibrillation/flutter (HCC)    a.) CHA2DS2VASc = 6 (sex, HFpEF, HTN, CVA x 2, prior MI/vascular disease) as of 08/11/2023; b.) rate/rhythm maintained intrinsically without pharmocological intervention; chronically anticoagulated with warfarin   Bilateral carotid artery stenosis    Carpal tunnel syndrome of left wrist    Centrilobular emphysema (HCC)    Cervical radiculopathy    Coronary artery disease involving native coronary artery of native heart    CVA (cerebral vascular accident)    a.) LEFT frontal lobe ACA territory infarct noted on CT head 07/20/2022; noted to be new when compared to 09/09/2014 imaging   Hammertoe of left foot 07/2023   Heart failure with recovered ejection fraction (HFrecEF) (HCC)    Hyperlipidemia    Lumbar radiculopathy    Myocardial infarction [facility]) 2007   Osteoarthritis of both knees    PAD (peripheral artery disease)    a.) s/p BILATERAL iliac artery stenting 2020; b.) s/p LEFT SFA and LEFT popliteal mechanical thrombectomy with Rotarex 2024   Pre-diabetes    Pulmonary hypertension (HCC)    Rheumatoid arthritis (HCC)    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 mechanical valve   Severe mitral regurgitation    a.) s/p MVR 12/13/2017   Warfarin anticoagulation      Current Outpatient Medications:    acetaminophen  (TYLENOL ) 500 MG tablet, Take 2 tablets (1,000 mg total) by mouth every 6 (six) hours as needed. (Patient taking differently: Take 500 mg by mouth every 6 (six) hours as needed.), Disp: 30 tablet, Rfl: 0   amLODipine  (NORVASC ) 5 MG tablet, Take 5 mg by mouth daily., Disp: , Rfl:    atorvastatin  (LIPITOR ) 80 MG tablet, Take 80 mg by mouth daily., Disp: , Rfl:    cetirizine (ZYRTEC) 10 MG tablet, Take 10 mg by mouth at bedtime. Reported on 05/28/2015, Disp: , Rfl:    doxycycline  (VIBRA -TABS) 100 MG tablet, Take 1 tablet (100 mg total) by mouth 2 (two) times daily., Disp: 28 tablet, Rfl: 0   enoxaparin (LOVENOX) 100 MG/ML injection, Inject 100 mg into the skin daily. Patient states she took one dose Friday evening, morning and evening Saturday and 1 dose Sunday morning, Disp: , Rfl:    EPINEPHrine  0.3 mg/0.3 mL IJ SOAJ injection, Inject into the muscle as directed., Disp: , Rfl:    ezetimibe  (ZETIA ) 10 MG tablet, Take 10 mg by mouth daily., Disp: , Rfl:    ibuprofen  (ADVIL ) 800 MG tablet, Take 1 tablet (800 mg total) by mouth every 6 (six) hours as needed., Disp: 60 tablet, Rfl: 1   nitroGLYCERIN  (NITROSTAT ) 0.4 MG SL tablet, Place 0.4 mg under the tongue every 5 (five) minutes as needed for chest  pain., Disp: , Rfl:    oxyCODONE -acetaminophen  (PERCOCET) 5-325 MG tablet, Take 1 tablet by mouth every 4 (four) hours as needed for severe pain (pain score 7-10)., Disp: 30 tablet, Rfl: 0   oxyCODONE -acetaminophen  (PERCOCET) 5-325 MG tablet, Take 1 tablet by mouth every 4 (four) hours as needed for severe pain (pain score 7-10)., Disp: 30 tablet, Rfl: 0   oxyCODONE -acetaminophen  (PERCOCET) 5-325 MG tablet, Take 1 tablet by mouth every 4 (four) hours as needed for severe pain (pain score 7-10)., Disp: 30 tablet, Rfl: 0   Semaglutide-Weight Management (WEGOVY) 0.5 MG/0.5ML SOAJ, Inject 0.5 mg into the skin once a week. Did not start yet, Disp: , Rfl:     warfarin (COUMADIN ) 5 MG tablet, Take 5 mg by mouth every evening., Disp: , Rfl:   Social History   Tobacco Use  Smoking Status Former   Current packs/day: 0.00   Average packs/day: 0.5 packs/day for 20.0 years (10.0 ttl pk-yrs)   Types: Cigarettes   Start date: 12/1998   Quit date: 12/2018   Years since quitting: 4.8  Smokeless Tobacco Never    Allergies  Allergen Reactions   Shellfish Allergy Anaphylaxis   Banana Other (See Comments)    Burning in the mouth   Garlic Other (See Comments)    Pt states arms freeze up and can't talk but no SOB   Isosorbide Nitrate Itching    Blurred vision   Fish Protein-Containing Drug Products Rash   Objective:  There were no vitals filed for this visit. There is no height or weight on file to calculate BMI. Constitutional Well developed. Well nourished.  Vascular Foot warm and well perfused. Capillary refill normal to all digits.   Neurologic Normal speech. Oriented to person, place, and time. Epicritic sensation to light touch grossly present bilaterally.  Dermatologic No further incision dehiscence noted.  Completely healed.  Small superficial wound noted to the fourth interdigital space.  Encourage Betadine wet-to-dry between the toes.  Orthopedic: Tenderness to palpation noted about the surgical site.   Radiographs: None Assessment:   No diagnosis found.  Plan:  Patient was evaluated and treated and all questions answered.  S/p foot surgery left -Progressing as expected post-operatively. -XR: See above -WB Status: Weightbearing as tolerated in cam boot -Sutures: Removed the superficial dehiscence present.  Some redness noted. -Medications: No further antibiotics - Betadine wet-to-dry dressing much improved now the wound has regressed simply to the fourth digit small superficial wound with granular wound base  Pes planovalgus/foot deformity -I explained to patient the etiology of pes planovalgus and relationship with  Planter fasciitis and various treatment options were discussed.  Given patient foot structure in the setting of Planter fasciitis I believe patient will benefit from custom-made orthotics to help control the hindfoot motion support the arch of the foot and take the stress away from plantar fascial.  Patient agrees with the plan like to proceed with orthotics - Orthotics were dispensed they are functioning well no acute complaints    No follow-ups on file.

## 2023-10-27 ENCOUNTER — Telehealth: Payer: Self-pay

## 2023-10-27 NOTE — Telephone Encounter (Signed)
 Patient called, asking for refill of pain medicine. thanks

## 2023-10-27 NOTE — Telephone Encounter (Signed)
 DOS 08/14/2023. Still having pain. 10/10, even after taking Tylenol . She is unable to take ibuprofen  due to history of heart surgery. Pinches and like a nerve or something. Has tried ice and elevation. Patient would like to know if a refill on the Percocet can be sent in to [facility] in [place].

## 2023-10-31 ENCOUNTER — Other Ambulatory Visit: Payer: Self-pay | Admitting: Podiatry

## 2023-10-31 MED ORDER — OXYCODONE-ACETAMINOPHEN 5-325 MG PO TABS: 1.0000 | ORAL_TABLET | ORAL | 0 refills | Status: AC | PRN

## 2023-10-31 MED ORDER — IBUPROFEN 800 MG PO TABS: 800.0000 mg | ORAL_TABLET | Freq: Four times a day (QID) | ORAL | 1 refills | Status: AC | PRN

## 2023-10-31 NOTE — Telephone Encounter (Signed)
 Patient informed that med has been sent in.

## 2023-11-23 ENCOUNTER — Ambulatory Visit (INDEPENDENT_AMBULATORY_CARE_PROVIDER_SITE_OTHER): Admitting: Podiatry

## 2023-11-23 DIAGNOSIS — M79674 Pain in right toe(s): Secondary | ICD-10-CM | POA: Diagnosis not present

## 2023-11-23 DIAGNOSIS — B351 Tinea unguium: Secondary | ICD-10-CM

## 2023-11-23 DIAGNOSIS — M79675 Pain in left toe(s): Secondary | ICD-10-CM | POA: Diagnosis not present

## 2023-11-23 NOTE — Progress Notes (Signed)
  Subjective:  Patient ID: Theresa Phelps, female    DOB: 1960-05-07,  MRN: [id number]  Chief Complaint  Patient presents with   Hammer Toe    Nail trim    63 y.o. female returns for the above complaint.  Patient presents with thickened and onychodystrophy mycotic toenails x 10 mild pain on palpation worse with ambulation is with pressure patient would like to maintain reduction has not been given herself denies any other acute signs  Objective:  There were no vitals filed for this visit. Podiatric Exam: Vascular: dorsalis pedis and posterior tibial pulses are palpable bilateral. Capillary return is immediate. Temperature gradient is WNL. Skin turgor WNL  Sensorium: Normal Semmes Weinstein monofilament test. Normal tactile sensation bilaterally. Nail Exam: Pt has thick disfigured discolored nails with subungual debris noted bilateral entire nail hallux through fifth toenails.  Pain on palpation to the nails. Ulcer Exam: There is no evidence of ulcer or pre-ulcerative changes or infection. Orthopedic Exam: Muscle tone and strength are WNL. No limitations in general ROM. No crepitus or effusions noted.  Skin: No Porokeratosis. No infection or ulcers    Assessment & Plan:  No diagnosis found.  Patient was evaluated and treated and all questions answered.  Onychomycosis with pain  -Nails palliatively debrided as below. -Educated on self-care  Procedure: Nail Debridement Rationale: pain  Type of Debridement: manual, sharp debridement. Instrumentation: Nail nipper, rotary burr. Number of Nails: 10  Procedures and Treatment: Consent by patient was obtained for treatment procedures. The patient understood the discussion of treatment and procedures well. All questions were answered thoroughly reviewed. Debridement of mycotic and hypertrophic toenails, 1 through 5 bilateral and clearing of subungual debris. No ulceration, no infection noted.  Return Visit-Office Procedure: Patient  instructed to return to the office for a follow up visit 3 months for continued evaluation and treatment.  Franky Blanch, DPM    No follow-ups on file.
